# Patient Record
Sex: Female | Born: 1955 | Race: White | Hispanic: No | Marital: Married | Smoking: Never smoker
Health system: Southern US, Community
[De-identification: ages and names within clinical notes are randomized; demographics above are authoritative.]

## PROBLEM LIST (undated history)

## (undated) DIAGNOSIS — M810 Age-related osteoporosis without current pathological fracture: Secondary | ICD-10-CM

## (undated) DIAGNOSIS — Z862 Personal history of diseases of the blood and blood-forming organs and certain disorders involving the immune mechanism: Secondary | ICD-10-CM

## (undated) DIAGNOSIS — E8801 Alpha-1-antitrypsin deficiency: Secondary | ICD-10-CM

## (undated) DIAGNOSIS — K589 Irritable bowel syndrome without diarrhea: Secondary | ICD-10-CM

## (undated) DIAGNOSIS — E785 Hyperlipidemia, unspecified: Secondary | ICD-10-CM

## (undated) DIAGNOSIS — Z780 Asymptomatic menopausal state: Secondary | ICD-10-CM

## (undated) DIAGNOSIS — N951 Menopausal and female climacteric states: Secondary | ICD-10-CM

## (undated) HISTORY — DX: Personal history of diseases of the blood and blood-forming organs and certain disorders involving the immune mechanism: Z86.2

## (undated) HISTORY — DX: Asymptomatic menopausal state: Z78.0

## (undated) HISTORY — DX: Menopausal and female climacteric states: N95.1

## (undated) HISTORY — DX: Hyperlipidemia, unspecified: E78.5

## (undated) HISTORY — PX: GALLBLADDER SURGERY: SHX652

## (undated) HISTORY — PX: OTHER SURGICAL HISTORY: SHX169

## (undated) HISTORY — DX: Age-related osteoporosis without current pathological fracture: M81.0

## (undated) HISTORY — PX: CHOLECYSTECTOMY: SHX55

## (undated) HISTORY — DX: Irritable bowel syndrome, unspecified: K58.9

## (undated) HISTORY — PX: TUBAL LIGATION: SHX77

## (undated) HISTORY — DX: Alpha-1-antitrypsin deficiency: E88.01

## (undated) HISTORY — PX: DILATION AND CURETTAGE OF UTERUS: SHX78

---

## 1994-08-24 HISTORY — PX: BREAST EXCISIONAL BIOPSY: SUR124

## 1998-02-08 ENCOUNTER — Other Ambulatory Visit: Admission: RE | Admit: 1998-02-08 | Discharge: 1998-02-08 | Payer: Self-pay | Admitting: Obstetrics and Gynecology

## 1999-03-10 ENCOUNTER — Other Ambulatory Visit: Admission: RE | Admit: 1999-03-10 | Discharge: 1999-03-10 | Payer: Self-pay | Admitting: Obstetrics and Gynecology

## 1999-06-04 ENCOUNTER — Encounter: Admission: RE | Admit: 1999-06-04 | Discharge: 1999-06-04 | Payer: Self-pay | Admitting: Obstetrics and Gynecology

## 1999-06-11 ENCOUNTER — Encounter: Payer: Self-pay | Admitting: Obstetrics and Gynecology

## 1999-06-11 ENCOUNTER — Encounter: Admission: RE | Admit: 1999-06-11 | Discharge: 1999-06-11 | Payer: Self-pay | Admitting: Obstetrics and Gynecology

## 2000-04-14 ENCOUNTER — Other Ambulatory Visit: Admission: RE | Admit: 2000-04-14 | Discharge: 2000-04-14 | Payer: Self-pay | Admitting: Obstetrics and Gynecology

## 2000-08-19 ENCOUNTER — Encounter: Admission: RE | Admit: 2000-08-19 | Discharge: 2000-08-19 | Payer: Self-pay | Admitting: Obstetrics and Gynecology

## 2000-08-19 ENCOUNTER — Encounter: Payer: Self-pay | Admitting: Obstetrics and Gynecology

## 2001-08-22 ENCOUNTER — Other Ambulatory Visit: Admission: RE | Admit: 2001-08-22 | Discharge: 2001-08-22 | Payer: Self-pay | Admitting: Obstetrics and Gynecology

## 2001-09-12 ENCOUNTER — Encounter: Payer: Self-pay | Admitting: Obstetrics and Gynecology

## 2001-09-12 ENCOUNTER — Encounter: Admission: RE | Admit: 2001-09-12 | Discharge: 2001-09-12 | Payer: Self-pay | Admitting: Obstetrics and Gynecology

## 2002-09-14 ENCOUNTER — Encounter: Admission: RE | Admit: 2002-09-14 | Discharge: 2002-09-14 | Payer: Self-pay | Admitting: Obstetrics and Gynecology

## 2002-09-14 ENCOUNTER — Encounter: Payer: Self-pay | Admitting: Obstetrics and Gynecology

## 2002-12-12 ENCOUNTER — Other Ambulatory Visit: Admission: RE | Admit: 2002-12-12 | Discharge: 2002-12-12 | Payer: Self-pay | Admitting: Obstetrics and Gynecology

## 2003-08-10 ENCOUNTER — Ambulatory Visit (HOSPITAL_COMMUNITY): Admission: RE | Admit: 2003-08-10 | Discharge: 2003-08-10 | Payer: Self-pay | Admitting: Pulmonary Disease

## 2003-12-07 ENCOUNTER — Encounter: Admission: RE | Admit: 2003-12-07 | Discharge: 2003-12-07 | Payer: Self-pay | Admitting: Obstetrics and Gynecology

## 2004-01-28 ENCOUNTER — Ambulatory Visit (HOSPITAL_COMMUNITY): Admission: RE | Admit: 2004-01-28 | Discharge: 2004-01-28 | Payer: Self-pay | Admitting: Pulmonary Disease

## 2004-02-19 ENCOUNTER — Other Ambulatory Visit: Admission: RE | Admit: 2004-02-19 | Discharge: 2004-02-19 | Payer: Self-pay | Admitting: Obstetrics and Gynecology

## 2005-03-25 ENCOUNTER — Other Ambulatory Visit: Admission: RE | Admit: 2005-03-25 | Discharge: 2005-03-25 | Payer: Self-pay | Admitting: Obstetrics and Gynecology

## 2005-03-25 ENCOUNTER — Encounter: Admission: RE | Admit: 2005-03-25 | Discharge: 2005-03-25 | Payer: Self-pay | Admitting: Obstetrics and Gynecology

## 2005-04-22 ENCOUNTER — Ambulatory Visit (HOSPITAL_COMMUNITY): Admission: RE | Admit: 2005-04-22 | Discharge: 2005-04-22 | Payer: Self-pay | Admitting: Obstetrics and Gynecology

## 2005-04-22 ENCOUNTER — Encounter (INDEPENDENT_AMBULATORY_CARE_PROVIDER_SITE_OTHER): Payer: Self-pay | Admitting: Specialist

## 2005-11-03 ENCOUNTER — Ambulatory Visit (HOSPITAL_COMMUNITY): Admission: RE | Admit: 2005-11-03 | Discharge: 2005-11-03 | Payer: Self-pay | Admitting: Pulmonary Disease

## 2005-12-03 ENCOUNTER — Ambulatory Visit (HOSPITAL_COMMUNITY): Admission: RE | Admit: 2005-12-03 | Discharge: 2005-12-03 | Payer: Self-pay | Admitting: Pulmonary Disease

## 2006-06-30 ENCOUNTER — Encounter: Admission: RE | Admit: 2006-06-30 | Discharge: 2006-06-30 | Payer: Self-pay | Admitting: Obstetrics and Gynecology

## 2006-07-05 ENCOUNTER — Encounter: Admission: RE | Admit: 2006-07-05 | Discharge: 2006-07-05 | Payer: Self-pay | Admitting: Obstetrics and Gynecology

## 2006-07-05 ENCOUNTER — Encounter (INDEPENDENT_AMBULATORY_CARE_PROVIDER_SITE_OTHER): Payer: Self-pay | Admitting: *Deleted

## 2006-08-31 ENCOUNTER — Ambulatory Visit: Payer: Self-pay | Admitting: Cardiovascular Disease

## 2006-09-01 ENCOUNTER — Ambulatory Visit (HOSPITAL_COMMUNITY): Admission: RE | Admit: 2006-09-01 | Discharge: 2006-09-01 | Payer: Self-pay | Admitting: Pulmonary Disease

## 2007-01-07 ENCOUNTER — Encounter: Admission: RE | Admit: 2007-01-07 | Discharge: 2007-01-07 | Payer: Self-pay | Admitting: Obstetrics and Gynecology

## 2007-07-22 ENCOUNTER — Encounter: Admission: RE | Admit: 2007-07-22 | Discharge: 2007-07-22 | Payer: Self-pay | Admitting: Obstetrics and Gynecology

## 2007-09-06 ENCOUNTER — Ambulatory Visit (HOSPITAL_COMMUNITY): Admission: RE | Admit: 2007-09-06 | Discharge: 2007-09-06 | Payer: Self-pay | Admitting: Pulmonary Disease

## 2008-07-13 ENCOUNTER — Ambulatory Visit (HOSPITAL_COMMUNITY): Admission: RE | Admit: 2008-07-13 | Discharge: 2008-07-13 | Payer: Self-pay | Admitting: Pulmonary Disease

## 2008-09-10 ENCOUNTER — Encounter: Admission: RE | Admit: 2008-09-10 | Discharge: 2008-09-10 | Payer: Self-pay | Admitting: Obstetrics and Gynecology

## 2008-10-18 ENCOUNTER — Ambulatory Visit (HOSPITAL_COMMUNITY): Admission: RE | Admit: 2008-10-18 | Discharge: 2008-10-18 | Payer: Self-pay | Admitting: Pulmonary Disease

## 2009-06-04 ENCOUNTER — Ambulatory Visit (HOSPITAL_COMMUNITY): Admission: RE | Admit: 2009-06-04 | Discharge: 2009-06-04 | Payer: Self-pay | Admitting: Pulmonary Disease

## 2009-09-20 ENCOUNTER — Ambulatory Visit: Admission: RE | Admit: 2009-09-20 | Discharge: 2009-09-20 | Payer: Self-pay | Admitting: Pulmonary Disease

## 2009-10-21 LAB — PULMONARY FUNCTION TEST

## 2009-10-25 ENCOUNTER — Encounter: Admission: RE | Admit: 2009-10-25 | Discharge: 2009-10-25 | Payer: Self-pay | Admitting: Obstetrics and Gynecology

## 2009-12-02 LAB — CBC AND DIFFERENTIAL
HCT: 41 (ref 36–46)
Hemoglobin: 14.4 (ref 12.0–16.0)
Platelets: 235 (ref 150–399)
WBC: 4.7

## 2009-12-02 LAB — BASIC METABOLIC PANEL
BUN: 16 (ref 4–21)
Creatinine: 0.7 (ref <=1.1)
Glucose: 93
Potassium: 4 (ref 3.4–5.3)

## 2009-12-02 LAB — HEPATIC FUNCTION PANEL
ALT: 50 — AB (ref 7–35)
AST: 40 — AB (ref 13–35)

## 2009-12-02 LAB — POCT ERYTHROCYTE SEDIMENTATION RATE, NON-AUTOMATED: Sed Rate: 9

## 2010-09-14 ENCOUNTER — Encounter: Payer: Self-pay | Admitting: Obstetrics and Gynecology

## 2010-10-20 ENCOUNTER — Other Ambulatory Visit (HOSPITAL_COMMUNITY): Payer: Self-pay | Admitting: Pulmonary Disease

## 2010-10-24 ENCOUNTER — Ambulatory Visit (HOSPITAL_COMMUNITY)
Admission: RE | Admit: 2010-10-24 | Discharge: 2010-10-24 | Payer: BC Managed Care – PPO | Source: Ambulatory Visit | Attending: Pulmonary Disease | Admitting: Pulmonary Disease

## 2010-10-30 ENCOUNTER — Other Ambulatory Visit (HOSPITAL_COMMUNITY): Payer: Self-pay | Admitting: Pulmonary Disease

## 2010-10-30 DIAGNOSIS — R109 Unspecified abdominal pain: Secondary | ICD-10-CM

## 2010-11-03 ENCOUNTER — Ambulatory Visit (HOSPITAL_COMMUNITY): Payer: BC Managed Care – PPO

## 2010-11-06 ENCOUNTER — Ambulatory Visit (HOSPITAL_COMMUNITY)
Admission: RE | Admit: 2010-11-06 | Discharge: 2010-11-06 | Disposition: A | Discharge status: Home / Self Care | Payer: BC Managed Care – PPO | Source: Ambulatory Visit | Attending: Pulmonary Disease | Admitting: Pulmonary Disease

## 2010-11-06 DIAGNOSIS — R1903 Right lower quadrant abdominal swelling, mass and lump: Secondary | ICD-10-CM | POA: Insufficient documentation

## 2010-11-06 DIAGNOSIS — R109 Unspecified abdominal pain: Secondary | ICD-10-CM

## 2010-11-24 ENCOUNTER — Other Ambulatory Visit: Payer: Self-pay | Admitting: Obstetrics and Gynecology

## 2010-11-24 DIAGNOSIS — Z1231 Encounter for screening mammogram for malignant neoplasm of breast: Secondary | ICD-10-CM

## 2010-12-23 ENCOUNTER — Ambulatory Visit: Payer: BC Managed Care – PPO

## 2010-12-25 ENCOUNTER — Ambulatory Visit
Admission: RE | Admit: 2010-12-25 | Discharge: 2010-12-25 | Disposition: A | Discharge status: Home / Self Care | Payer: BC Managed Care – PPO | Source: Ambulatory Visit | Attending: Obstetrics and Gynecology | Admitting: Obstetrics and Gynecology

## 2010-12-25 DIAGNOSIS — Z1231 Encounter for screening mammogram for malignant neoplasm of breast: Secondary | ICD-10-CM

## 2011-01-09 NOTE — H&P (Signed)
NAME:  Kathleen Hammond, ARCHULETA NO.:  [id number]   MEDICAL RECORD NO.:  [id number]          PATIENT TYPE:  AMB   LOCATION:                                FACILITY:  [facility]   PHYSICIAN:  Juluis Mire, M.D.   DATE OF BIRTH:  1956/04/15   DATE OF ADMISSION:  04/22/2005  DATE OF DISCHARGE:                                HISTORY & PHYSICAL   Patient is a 55 year old gravida 4, para 3, abortus 1 married female.  Presents for hysteroscopy.   In relation to the present admission, patient has been noticing abnormal  uterine bleeding.  She underwent a saline infusion ultrasound with finding  of a posterior wall endometrial polyp.  In view of this the patient now  presents for hysteroscopic evaluation and resection of the posterior wall  polyp.   ALLERGIES:  IODINE, MACDRODANTIN, CECLOR.   MEDICATIONS:  None.   PAST MEDICAL HISTORY:  She has usual childhood diseases without any  significant sequelae.   PAST SURGICAL HISTORY:  She had a cholecystectomy, previous lump removed  from the breast, a cyst removed from the gum and she has had three cesarean  sections, last one with bilateral tubal ligation.   SOCIAL HISTORY:  No tobacco or alcohol use.   FAMILY HISTORY:  Father with a history of hypertension and heart disease.  Mother with a history of endocrinologic disorders.   REVIEW OF SYSTEMS:  Noncontributory.   PHYSICAL EXAMINATION:  VITAL SIGNS:  Patient is afebrile with stable vital  signs.  HEENT:  Patient normocephalic.  Pupils are equal, round, and reactive to  light and accommodation.  Extraocular movements are intact.  Sclerae and  conjunctivae clear.  Oropharynx clear.  NECK:  Without thyromegaly.  BREASTS:  No discrete masses.  LUNGS:  Clear.  CARDIOVASCULAR:  Regular rate and rhythm.  No murmurs or gallops.  ABDOMEN:  Benign.  No masses, organomegaly, or tenderness.  PELVIC:  Normal external genitalia.  Vaginal mucosa is clear.  Cervix  unremarkable.  Uterus  normal size, shape, and contour.  Adnexa free of  masses or tenderness.  EXTREMITIES:  Trace edema.  NEUROLOGIC:  Grossly within normal limits.   IMPRESSION:  Endometrial polyp.   PLAN:  The patient will undergo hysteroscopic evaluation and resection of  polyp.  The risks of surgery have been discussed including the risk of  infection, risk of hemorrhage that could require transfusion with the risks  of AIDS or hepatitis, the risk of injury to adjacent organs through  perforation that could require further exploratory surgery, risk of deep  venous thrombosis and pulmonary embolus.  Patient expressed understanding of  indications and risks.           ______________________________  Juluis Mire, M.D.     JSM/MEDQ  D:  04/22/2005  T:  04/22/2005  Job:  [id number]

## 2011-01-09 NOTE — Procedures (Signed)
NAME:  Kathleen Hammond, Kathleen Hammond                 ACCOUNT NO.:  [id number]   MEDICAL RECORD NO.:  [id number]          PATIENT TYPE:  OUT   LOCATION:  RAD                           FACILITY:  [facility]   PHYSICIAN:  Peter C. Eden Emms, MD, FACCDATE OF BIRTH:  [birth year]   DATE OF PROCEDURE:  DATE OF DISCHARGE:                                ECHOCARDIOGRAM   A 50-year female with PVCs.   Left ventricular cavity size was normal.  There were no regional wall  motion abnormalities.  Ejection fraction was 60%.  Mitral, aortic,  tricuspid valves were normal.  There was no evidence of pulmonary  hypertension.  Left atrium and right-sided cardiac chambers were normal.  Subcostal imaging revealed no atrial septal defect, no source of  embolus, no effusion.  M-mode measurements included an aortic diameter  of 2.6-cm, left atrial diameter of 3.5-cm, septal thickness 1.2-cm,  posterior wall thickness 1-cm, and systolic diameter 2.6-cm, and LV  diastolic diameter 3.55-cm.   IMPRESSION:  Normal 2-D echocardiogram, ejection fraction 60%, second.      Noralyn Pick. Eden Emms, MD, [facility]  Electronically Signed     PCN/MEDQ  D:  09/01/2006  T:  09/01/2006  Job:  [id number]   cc:   Ramon Dredge L. Juanetta Gosling, M.D.  Fax: [phone]

## 2011-01-09 NOTE — Op Note (Signed)
NAME:  Kathleen Hammond, YAGI                 ACCOUNT NO.:  [id number]   MEDICAL RECORD NO.:  [id number]          PATIENT TYPE:  AMB   LOCATION:  SD[facility]                           FACILITY:  [facility]   PHYSICIAN:  Juluis Mire, M.D.   DATE OF BIRTH:  06-12-1956   DATE OF PROCEDURE:  04/22/2005  DATE OF DISCHARGE:                                 OPERATIVE REPORT   PREOPERATIVE DIAGNOSIS:  Abnormal uterine bleeding with endometrial polyp.   POSTOPERATIVE DIAGNOSIS:  Abnormal uterine bleeding with endometrial polyp.   OPERATIVE PROCEDURE:  Hysteroscopy with resection of endometrial polyp and  endometrial curettings.   ANESTHESIA:  Paracervical block and sedation.   ESTIMATED BLOOD LOSS:  Minimal.   PACKS AND DRAINS:  None.   INTRAOPERATIVE BLOOD REPLACED:  None.   COMPLICATIONS:  None.   INDICATIONS:  Dictated in the history and physical.   PROCEDURE:  The patient was taken to the OR and placed in the supine  position.  After slight sedation, the patient was placed in the dorsal  lithotomy position using the Allen stirrups.  The patient then draped in a  sterile field.  A speculum was placed in the vaginal vault.  The cervix was  cleansed with antiseptic solution.  A paracervical block was administered  using 1% Nesacaine.  The cervix was grasped with a single-tooth tenaculum.  The uterus sounded initially fairly shallow, felt like we were not getting  it fully back.  We therefore dilated the endocervical canal and inserted the  hysteroscope and could identify the canal going posteriorly.  We re-sounded  to approximately 8 cm.  The remaining endocervical canal was dilated to a  size 33 Pratt dilator.  The operative hysteroscope was introduced into the  intrauterine cavity.  It really did not reveal any significant polyp.  It  may have been the distortion caused by the tortuous endocervical canal.  We  did multiple endometrial biopsies along with curetting.  These were all sent  for pathologic  review.  There was no sign of perforation.  Total deficit was  130 mL.  The hysteroscope was then removed.  The single-tooth tenaculum and  speculum then were taken out of the vaginal vault.  The patient taken out of  the dorsal lithotomy position and once alert transferred to the recovery  room in good condition.  The sponge, instrument and needle count were  reported as correct by the circulating nurse.      Juluis Mire, M.D.  Electronically Signed    JSM/MEDQ  D:  04/22/2005  T:  04/22/2005  Job:  [id number]

## 2011-03-13 ENCOUNTER — Encounter: Payer: Self-pay | Admitting: *Deleted

## 2011-03-13 ENCOUNTER — Encounter: Payer: Self-pay | Admitting: Cardiology

## 2011-03-16 ENCOUNTER — Encounter: Payer: Self-pay | Admitting: Cardiology

## 2011-03-16 ENCOUNTER — Ambulatory Visit (INDEPENDENT_AMBULATORY_CARE_PROVIDER_SITE_OTHER): Payer: BC Managed Care – PPO | Admitting: Cardiology

## 2011-03-16 VITALS — BP 119/72 | HR 137 | Ht 66.0 in | Wt 176.0 lb

## 2011-03-16 DIAGNOSIS — R000 Tachycardia, unspecified: Secondary | ICD-10-CM | POA: Insufficient documentation

## 2011-03-16 DIAGNOSIS — I498 Other specified cardiac arrhythmias: Secondary | ICD-10-CM

## 2011-03-16 DIAGNOSIS — E782 Mixed hyperlipidemia: Secondary | ICD-10-CM | POA: Insufficient documentation

## 2011-03-16 NOTE — Progress Notes (Signed)
HPI Kathleen Hammond is a delightful 55 yo MWF referred by Dr Arelia Sneddon for mixed hyperlipidemia.  Her last  Lipid profile showed a total cholesterol of 249, triglycerides 175, HDL 62, LDL 152 with a total cholesterol to HDL ratio 4.0. Her LFTs were normal. CRP was average at 2.5.  Of note, she has gained about 30 pounds off and on prednisone for a recent diagnosis of asthma and alpha one antitrypsin deficiency.  Her father had a heart attack at age 18 but was a smoker.  The patient has no history of diabetes, has never smoked, and does not have hypertension. She does not exercise on a regular basis. Her EKG today is remarkable for sinus tachycardia. The initial EKG showed a rate of 137 and the second one showed a rate of 122 beats per minute. No ST segment changes.  She has  dyspnea on exertion particularly on hot and humid days like today. She has not had any angina. Past Medical History  Diagnosis Date  . Asthma   . Postmenopausal   . Perimenopausal vasomotor symptoms   . Urinary incontinence   . Alpha-1-antitrypsin deficiency   . History of anemia     Past Surgical History  Procedure Date  . Cholecystectomy   . Lump removed from the breast   . Tubal ligation   . Cyst removed from the gum   . Cesarean section   . Dilation and curettage of uterus   . Gallbladder surgery     Family History  Problem Relation Age of Onset  . Hypertension    . Heart disease    . Stroke    . Heart attack Father 7    massive heart attack  . Hypertension Father   . Cancer Mother     sinus   . Anemia Mother     History   Social History  . Marital Status: Married    Spouse Name: N/A    Number of Children: N/A  . Years of Education: N/A   Occupational History  . Not on file.   Social History Main Topics  . Smoking status: Never Smoker   . Smokeless tobacco: Never Used  . Alcohol Use: Yes     occasional  . Drug Use: No  . Sexually Active: Not on file   Other Topics Concern  . Not on  file   Social History Narrative  . No narrative on file    Allergies  Allergen Reactions  . Bactrim   . Ceftin   . Cetrimide   . Iodine   . Shrimp (Shellfish Allergy)     Can eat lobster    Current Outpatient Prescriptions  Medication Sig Dispense Refill  . albuterol (PROVENTIL HFA;VENTOLIN HFA) 108 (90 BASE) MCG/ACT inhaler Inhale 2 puffs into the lungs as needed.        Marland Kitchen albuterol (PROVENTIL) (2.5 MG/3ML) 0.083% nebulizer solution Take 2.5 mg by nebulization as needed.        . Budesonide-Formoterol Fumarate (SYMBICORT IN) Inhale 2 puffs into the lungs daily.        . ergocalciferol (VITAMIN D2) 50000 UNITS capsule Take 50,000 Units by mouth once a week.        Marland Kitchen ofloxacin (OCUFLOX) 0.3 % ophthalmic solution AS DIRECTED      . PARoxetine (PAXIL) 10 MG tablet Take 1 tablet by mouth Daily.      Marland Kitchen SINGULAIR 10 MG tablet Take 1 tablet by mouth Daily.  ROS Negative other than HPI.   PE General Appearance: well developed, well nourished in no acute distress, overweight HEENT: symmetrical face, PERRLA, good dentition  Neck: no JVD, thyromegaly, or adenopathy, trachea midline Chest: symmetric without deformity Cardiac: PMI non-displaced, RRR, normal S1, S2, no gallop or murmur Lung: clear to ausculation and percussion Vascular: all pulses full without bruits  Abdominal: nondistended, nontender, good bowel sounds, no HSM, no bruits Extremities: no cyanosis, clubbing or edema, no sign of DVT, no varicosities  Skin: normal color, no rashes Neuro: alert and oriented x 3, non-focal Pysch: normal affect Filed Vitals:   03/16/11 1443  BP: 119/72  Pulse: 137  Height: 5\' 6"  (1.676 m)  Weight: 176 lb (79.833 kg)    EKG  Labs and Studies Reviewed.   No results found for this basename: WBC, HGB, HCT, MCV, PLT      Chemistry   No results found for this basename: NA, K, CL, CO2, BUN, CREATININE, GLU   No results found for this basename: CALCIUM, ALKPHOS, AST, ALT,  BILITOT       No results found for this basename: CHOL   No results found for this basename: HDL   No results found for this basename: LDLCALC   No results found for this basename: TRIG   No results found for this basename: CHOLHDL   No results found for this basename: HGBA1C   No results found for this basename: ALT, AST, GGT, ALKPHOS, BILITOT   No results found for this basename: TSH

## 2011-03-16 NOTE — Patient Instructions (Signed)
Your physician discussed the importance of regular exercise and recommended that you start or continue a regular exercise program for good health.  3 hours per week at the gym  Your physician encouraged you to lose weight for better health.  A weight loss of 1 pound per week to reach your goal weight loss of 30 pounds.  A goal heart rate is less than or equal to 90 beats per minute.  Your physician recommends that you schedule a follow-up appointment in: as needed with Dr. Daleen Squibb

## 2011-04-07 ENCOUNTER — Encounter: Payer: Self-pay | Admitting: Cardiology

## 2011-12-02 ENCOUNTER — Other Ambulatory Visit: Payer: Self-pay | Admitting: Obstetrics and Gynecology

## 2011-12-02 DIAGNOSIS — N644 Mastodynia: Secondary | ICD-10-CM

## 2011-12-04 ENCOUNTER — Ambulatory Visit
Admission: RE | Admit: 2011-12-04 | Discharge: 2011-12-04 | Disposition: A | Discharge status: Home / Self Care | Payer: BC Managed Care – PPO | Source: Ambulatory Visit | Attending: Obstetrics and Gynecology | Admitting: Obstetrics and Gynecology

## 2011-12-04 ENCOUNTER — Other Ambulatory Visit: Payer: Self-pay | Admitting: Obstetrics and Gynecology

## 2011-12-04 DIAGNOSIS — N644 Mastodynia: Secondary | ICD-10-CM

## 2011-12-04 DIAGNOSIS — Z1231 Encounter for screening mammogram for malignant neoplasm of breast: Secondary | ICD-10-CM

## 2011-12-29 ENCOUNTER — Ambulatory Visit
Admission: RE | Admit: 2011-12-29 | Discharge: 2011-12-29 | Disposition: A | Discharge status: Home / Self Care | Payer: BC Managed Care – PPO | Source: Ambulatory Visit | Attending: Obstetrics and Gynecology | Admitting: Obstetrics and Gynecology

## 2011-12-29 DIAGNOSIS — Z1231 Encounter for screening mammogram for malignant neoplasm of breast: Secondary | ICD-10-CM

## 2012-04-05 ENCOUNTER — Other Ambulatory Visit (HOSPITAL_COMMUNITY): Payer: Self-pay | Admitting: Pulmonary Disease

## 2012-04-05 DIAGNOSIS — J45909 Unspecified asthma, uncomplicated: Secondary | ICD-10-CM

## 2012-04-06 ENCOUNTER — Other Ambulatory Visit (HOSPITAL_COMMUNITY): Payer: Self-pay | Admitting: Pulmonary Disease

## 2012-04-06 ENCOUNTER — Ambulatory Visit (HOSPITAL_COMMUNITY): Payer: BC Managed Care – PPO

## 2012-04-06 ENCOUNTER — Ambulatory Visit (HOSPITAL_COMMUNITY)
Admission: RE | Admit: 2012-04-06 | Discharge: 2012-04-06 | Disposition: A | Discharge status: Home / Self Care | Payer: BC Managed Care – PPO | Source: Ambulatory Visit | Attending: Pulmonary Disease | Admitting: Pulmonary Disease

## 2012-04-06 DIAGNOSIS — J45909 Unspecified asthma, uncomplicated: Secondary | ICD-10-CM

## 2012-04-06 DIAGNOSIS — E8801 Alpha-1-antitrypsin deficiency: Secondary | ICD-10-CM | POA: Insufficient documentation

## 2012-04-06 LAB — LIPID PANEL
Cholesterol: 304 — AB (ref 0–200)
HDL: 65 (ref 35–70)
LDL Cholesterol: 209
Triglycerides: 148 (ref 40–160)

## 2012-04-06 LAB — HEPATIC FUNCTION PANEL
ALT: 16 (ref 7–35)
AST: 15 (ref 13–35)
Alkaline Phosphatase: 78 (ref 25–125)

## 2012-04-06 LAB — TSH: TSH: 0.62 (ref <=5.90)

## 2012-04-06 LAB — CBC AND DIFFERENTIAL
HCT: 43 (ref 36–46)
Hemoglobin: 14.4 (ref 12.0–16.0)
Platelets: 235 (ref 150–399)
WBC: 7.8

## 2012-04-06 LAB — IRON,TIBC AND FERRITIN PANEL: Ferritin: 128

## 2012-04-07 LAB — VITAMIN D 25 HYDROXY (VIT D DEFICIENCY, FRACTURES): Vit D, 25-Hydroxy: 32

## 2012-04-15 ENCOUNTER — Ambulatory Visit (HOSPITAL_COMMUNITY)
Admission: RE | Admit: 2012-04-15 | Discharge: 2012-04-15 | Disposition: A | Discharge status: Home / Self Care | Payer: BC Managed Care – PPO | Source: Ambulatory Visit | Attending: Pulmonary Disease | Admitting: Pulmonary Disease

## 2012-04-15 DIAGNOSIS — R0989 Other specified symptoms and signs involving the circulatory and respiratory systems: Secondary | ICD-10-CM | POA: Insufficient documentation

## 2012-04-15 DIAGNOSIS — R0609 Other forms of dyspnea: Secondary | ICD-10-CM | POA: Insufficient documentation

## 2012-04-15 DIAGNOSIS — J45909 Unspecified asthma, uncomplicated: Secondary | ICD-10-CM | POA: Insufficient documentation

## 2012-04-15 MED ORDER — ALBUTEROL SULFATE (5 MG/ML) 0.5% IN NEBU
2.5000 mg | INHALATION_SOLUTION | Freq: Once | RESPIRATORY_TRACT | Status: AC
  Administered 2012-04-15: 2.5 mg via RESPIRATORY_TRACT

## 2012-04-19 NOTE — Procedures (Signed)
NAMEBRAILYNN, Kathleen Hammond NO.:  [id number]  MEDICAL RECORD NO.:  [id number]  LOCATION:                                 FACILITY:  PHYSICIAN:  Bao Bazen L. Juanetta Gosling, M.D.DATE OF BIRTH:  1956-07-17  DATE OF PROCEDURE:  04/18/2012 DATE OF DISCHARGE:                           PULMONARY FUNCTION TEST   Reason for pulmonary function testing is asthma.  1. Spirometry shows no ventilatory defect and small amount of airflow     obstruction. 2. Lung volumes are normal. 3. DLCO is moderately reduced, but corrects when ventilation is taken     into account. 4. Airway resistance is increased confirming the presence of airflow     obstruction. 5. There is improvement with inhaled bronchodilator that reaches the     level of significance. 6. This study is consistent with asthma.     Gary Bultman L. Juanetta Gosling, M.D.     ELH/MEDQ  D:  04/18/2012  T:  04/18/2012  Job:  [id number]

## 2012-04-20 LAB — PULMONARY FUNCTION TEST

## 2012-06-09 ENCOUNTER — Encounter (HOSPITAL_COMMUNITY): Payer: Self-pay

## 2012-06-09 ENCOUNTER — Encounter (HOSPITAL_COMMUNITY)
Admission: RE | Admit: 2012-06-09 | Discharge: 2012-06-09 | Disposition: A | Discharge status: Home / Self Care | Payer: BC Managed Care – PPO | Source: Ambulatory Visit | Attending: Pulmonary Disease | Admitting: Pulmonary Disease

## 2012-06-09 VITALS — BP 100/62 | HR 81 | Ht 66.0 in | Wt 174.2 lb

## 2012-06-09 DIAGNOSIS — J441 Chronic obstructive pulmonary disease with (acute) exacerbation: Secondary | ICD-10-CM

## 2012-06-09 DIAGNOSIS — J45909 Unspecified asthma, uncomplicated: Secondary | ICD-10-CM | POA: Insufficient documentation

## 2012-06-09 DIAGNOSIS — J449 Chronic obstructive pulmonary disease, unspecified: Secondary | ICD-10-CM

## 2012-06-09 NOTE — Progress Notes (Signed)
Patient was referred to Pulmonary Rehab by Dr. Kari Baars due to COPD/Asthma. During orientation advised patient on arrival and appointment times what to wear, what to do before, during and after exercise. Reviewed attendance and class policy. Talked about inclement weather and class consultation policy. Pt is scheduled to start Pulmonary Rehab on 06/13/12 at 1 pm. Pt was advised to come to class 5 minutes before class starts. He was also given instructions on meeting with the dietician and attending the Family Structure classes. Pt is eager to get started.

## 2012-06-09 NOTE — Patient Instructions (Signed)
Pt has finished orientation and is scheduled to start PR on 06/13/12 at 1 pm. Pt has been instructed to arrive to class 15 minutes early for scheduled class. Pt has been instructed to wear comfortable clothing and shoes with rubber soles. Pt has been told to take their medications 1 hour prior to coming to class.  If the patient is not going to attend class, he/she has been instructed to call.

## 2012-06-13 ENCOUNTER — Encounter (HOSPITAL_COMMUNITY): Payer: BC Managed Care – PPO

## 2012-06-15 ENCOUNTER — Encounter (HOSPITAL_COMMUNITY)
Admission: RE | Admit: 2012-06-15 | Discharge: 2012-06-15 | Disposition: A | Discharge status: Home / Self Care | Payer: BC Managed Care – PPO | Source: Ambulatory Visit | Attending: Pulmonary Disease | Admitting: Pulmonary Disease

## 2012-06-20 ENCOUNTER — Encounter (HOSPITAL_COMMUNITY)
Admission: RE | Admit: 2012-06-20 | Discharge: 2012-06-20 | Disposition: A | Discharge status: Home / Self Care | Payer: BC Managed Care – PPO | Source: Ambulatory Visit | Attending: Pulmonary Disease | Admitting: Pulmonary Disease

## 2012-06-22 ENCOUNTER — Encounter (HOSPITAL_COMMUNITY): Payer: BC Managed Care – PPO

## 2012-06-27 ENCOUNTER — Encounter (HOSPITAL_COMMUNITY): Payer: BC Managed Care – PPO

## 2012-06-29 ENCOUNTER — Encounter (HOSPITAL_COMMUNITY): Payer: BC Managed Care – PPO

## 2012-07-04 ENCOUNTER — Encounter (HOSPITAL_COMMUNITY)
Admission: RE | Admit: 2012-07-04 | Discharge: 2012-07-04 | Disposition: A | Discharge status: Home / Self Care | Payer: BC Managed Care – PPO | Source: Ambulatory Visit | Attending: Pulmonary Disease | Admitting: Pulmonary Disease

## 2012-07-04 DIAGNOSIS — J45909 Unspecified asthma, uncomplicated: Secondary | ICD-10-CM | POA: Insufficient documentation

## 2012-07-06 ENCOUNTER — Encounter (HOSPITAL_COMMUNITY)
Admission: RE | Admit: 2012-07-06 | Discharge: 2012-07-06 | Disposition: A | Discharge status: Home / Self Care | Payer: BC Managed Care – PPO | Source: Ambulatory Visit | Attending: Pulmonary Disease | Admitting: Pulmonary Disease

## 2012-07-11 ENCOUNTER — Encounter (HOSPITAL_COMMUNITY)
Admission: RE | Admit: 2012-07-11 | Discharge: 2012-07-11 | Disposition: A | Discharge status: Home / Self Care | Payer: BC Managed Care – PPO | Source: Ambulatory Visit | Attending: Pulmonary Disease | Admitting: Pulmonary Disease

## 2012-07-13 ENCOUNTER — Encounter (HOSPITAL_COMMUNITY): Payer: BC Managed Care – PPO

## 2012-07-15 LAB — LIPID PANEL
Cholesterol: 192 (ref 0–200)
HDL: 60 (ref 35–70)
LDL Cholesterol: 96
Triglycerides: 178 — AB (ref 40–160)

## 2012-07-15 LAB — HEPATIC FUNCTION PANEL
ALT: 35 (ref 7–35)
AST: 31 (ref 13–35)
Alkaline Phosphatase: 80 (ref 25–125)

## 2012-07-18 ENCOUNTER — Encounter (HOSPITAL_COMMUNITY): Payer: BC Managed Care – PPO

## 2012-07-20 ENCOUNTER — Encounter (HOSPITAL_COMMUNITY): Payer: BC Managed Care – PPO

## 2012-07-25 ENCOUNTER — Encounter (HOSPITAL_COMMUNITY): Payer: BC Managed Care – PPO

## 2012-07-27 ENCOUNTER — Encounter (HOSPITAL_COMMUNITY): Payer: BC Managed Care – PPO

## 2012-08-01 ENCOUNTER — Encounter (HOSPITAL_COMMUNITY): Payer: BC Managed Care – PPO

## 2012-08-03 ENCOUNTER — Encounter (HOSPITAL_COMMUNITY)
Admission: RE | Admit: 2012-08-03 | Discharge: 2012-08-03 | Disposition: A | Discharge status: Home / Self Care | Payer: BC Managed Care – PPO | Source: Ambulatory Visit | Attending: Pulmonary Disease | Admitting: Pulmonary Disease

## 2012-08-03 DIAGNOSIS — J45909 Unspecified asthma, uncomplicated: Secondary | ICD-10-CM | POA: Insufficient documentation

## 2012-08-08 ENCOUNTER — Encounter (HOSPITAL_COMMUNITY): Payer: BC Managed Care – PPO

## 2012-08-10 ENCOUNTER — Encounter (HOSPITAL_COMMUNITY): Payer: BC Managed Care – PPO

## 2012-08-15 ENCOUNTER — Encounter (HOSPITAL_COMMUNITY): Payer: BC Managed Care – PPO

## 2012-08-22 ENCOUNTER — Encounter (HOSPITAL_COMMUNITY)
Admission: RE | Admit: 2012-08-22 | Discharge: 2012-08-22 | Disposition: A | Discharge status: Home / Self Care | Payer: BC Managed Care – PPO | Source: Ambulatory Visit | Attending: Pulmonary Disease | Admitting: Pulmonary Disease

## 2012-08-24 ENCOUNTER — Encounter (HOSPITAL_COMMUNITY): Payer: BC Managed Care – PPO

## 2012-08-29 ENCOUNTER — Encounter (HOSPITAL_COMMUNITY): Payer: BC Managed Care – PPO

## 2012-08-31 ENCOUNTER — Encounter (HOSPITAL_COMMUNITY): Payer: BC Managed Care – PPO

## 2012-08-31 ENCOUNTER — Encounter (HOSPITAL_COMMUNITY)
Admission: RE | Admit: 2012-08-31 | Discharge: 2012-08-31 | Disposition: A | Discharge status: Home / Self Care | Payer: BC Managed Care – PPO | Source: Ambulatory Visit | Attending: Pulmonary Disease | Admitting: Pulmonary Disease

## 2012-08-31 DIAGNOSIS — J45909 Unspecified asthma, uncomplicated: Secondary | ICD-10-CM | POA: Insufficient documentation

## 2012-09-05 ENCOUNTER — Encounter (HOSPITAL_COMMUNITY): Payer: BC Managed Care – PPO

## 2012-09-07 ENCOUNTER — Encounter (HOSPITAL_COMMUNITY): Payer: BC Managed Care – PPO

## 2012-09-12 ENCOUNTER — Encounter (HOSPITAL_COMMUNITY): Payer: BC Managed Care – PPO

## 2012-09-14 ENCOUNTER — Encounter (HOSPITAL_COMMUNITY): Payer: BC Managed Care – PPO

## 2012-09-19 ENCOUNTER — Encounter (HOSPITAL_COMMUNITY): Payer: BC Managed Care – PPO

## 2012-09-21 ENCOUNTER — Encounter (HOSPITAL_COMMUNITY): Payer: BC Managed Care – PPO

## 2012-09-26 ENCOUNTER — Encounter (HOSPITAL_COMMUNITY): Payer: BC Managed Care – PPO

## 2012-09-28 ENCOUNTER — Encounter (HOSPITAL_COMMUNITY): Payer: BC Managed Care – PPO

## 2012-10-03 ENCOUNTER — Encounter (HOSPITAL_COMMUNITY): Payer: BC Managed Care – PPO

## 2012-10-05 ENCOUNTER — Encounter (HOSPITAL_COMMUNITY): Payer: BC Managed Care – PPO

## 2012-10-10 ENCOUNTER — Encounter (HOSPITAL_COMMUNITY): Payer: BC Managed Care – PPO

## 2012-10-12 ENCOUNTER — Encounter (HOSPITAL_COMMUNITY): Payer: BC Managed Care – PPO

## 2012-10-17 ENCOUNTER — Encounter (HOSPITAL_COMMUNITY): Payer: BC Managed Care – PPO

## 2012-10-19 ENCOUNTER — Encounter (HOSPITAL_COMMUNITY): Payer: BC Managed Care – PPO

## 2012-10-24 ENCOUNTER — Encounter (HOSPITAL_COMMUNITY): Payer: BC Managed Care – PPO

## 2012-10-26 ENCOUNTER — Encounter (HOSPITAL_COMMUNITY): Payer: BC Managed Care – PPO

## 2012-10-31 ENCOUNTER — Encounter (HOSPITAL_COMMUNITY): Payer: BC Managed Care – PPO

## 2012-11-02 ENCOUNTER — Encounter (HOSPITAL_COMMUNITY): Payer: BC Managed Care – PPO

## 2012-11-05 ENCOUNTER — Emergency Department (HOSPITAL_COMMUNITY)
Admission: EM | Admit: 2012-11-05 | Discharge: 2012-11-05 | Disposition: A | Discharge status: Home / Self Care | Payer: BC Managed Care – PPO | Attending: Emergency Medicine | Admitting: Emergency Medicine

## 2012-11-05 ENCOUNTER — Encounter (HOSPITAL_COMMUNITY): Payer: Self-pay

## 2012-11-05 ENCOUNTER — Emergency Department (HOSPITAL_COMMUNITY): Payer: BC Managed Care – PPO

## 2012-11-05 DIAGNOSIS — Z862 Personal history of diseases of the blood and blood-forming organs and certain disorders involving the immune mechanism: Secondary | ICD-10-CM | POA: Insufficient documentation

## 2012-11-05 DIAGNOSIS — R110 Nausea: Secondary | ICD-10-CM | POA: Insufficient documentation

## 2012-11-05 DIAGNOSIS — K859 Acute pancreatitis without necrosis or infection, unspecified: Secondary | ICD-10-CM | POA: Insufficient documentation

## 2012-11-05 DIAGNOSIS — Z79899 Other long term (current) drug therapy: Secondary | ICD-10-CM | POA: Insufficient documentation

## 2012-11-05 DIAGNOSIS — J45909 Unspecified asthma, uncomplicated: Secondary | ICD-10-CM | POA: Insufficient documentation

## 2012-11-05 DIAGNOSIS — R197 Diarrhea, unspecified: Secondary | ICD-10-CM | POA: Insufficient documentation

## 2012-11-05 DIAGNOSIS — Z8639 Personal history of other endocrine, nutritional and metabolic disease: Secondary | ICD-10-CM | POA: Insufficient documentation

## 2012-11-05 DIAGNOSIS — R630 Anorexia: Secondary | ICD-10-CM | POA: Insufficient documentation

## 2012-11-05 DIAGNOSIS — Z780 Asymptomatic menopausal state: Secondary | ICD-10-CM | POA: Insufficient documentation

## 2012-11-05 LAB — URINALYSIS, ROUTINE W REFLEX MICROSCOPIC
Bilirubin Urine: NEGATIVE
Hgb urine dipstick: NEGATIVE
Specific Gravity, Urine: 1.02 (ref 1.005–1.030)
pH: 6 (ref 5.0–8.0)

## 2012-11-05 LAB — COMPREHENSIVE METABOLIC PANEL
ALT: 17 U/L (ref 0–35)
AST: 19 U/L (ref 0–37)
Albumin: 3.7 g/dL (ref 3.5–5.2)
Alkaline Phosphatase: 75 U/L (ref 39–117)
Calcium: 8.8 mg/dL (ref 8.4–10.5)
Potassium: 3.2 mEq/L — ABNORMAL LOW (ref 3.5–5.1)
Sodium: 142 mEq/L (ref 135–145)
Total Protein: 6.5 g/dL (ref 6.0–8.3)

## 2012-11-05 LAB — CBC WITH DIFFERENTIAL/PLATELET
Basophils Absolute: 0 10*3/uL (ref 0.0–0.1)
Basophils Relative: 1 % (ref 0–1)
Eosinophils Absolute: 0.1 10*3/uL (ref 0.0–0.7)
Lymphs Abs: 0.9 10*3/uL (ref 0.7–4.0)
MCH: 30.5 pg (ref 26.0–34.0)
MCHC: 35.4 g/dL (ref 30.0–36.0)
Neutrophils Relative %: 56 % (ref 43–77)
Platelets: 164 10*3/uL (ref 150–400)
RBC: 4.42 MIL/uL (ref 3.87–5.11)
RDW: 12.7 % (ref 11.5–15.5)

## 2012-11-05 MED ORDER — HYDROCODONE-ACETAMINOPHEN 5-325 MG PO TABS: 1.0000 | ORAL_TABLET | Freq: Three times a day (TID) | ORAL | Status: DC | PRN

## 2012-11-05 MED ORDER — IOHEXOL 300 MG/ML  SOLN
100.0000 mL | Freq: Once | INTRAMUSCULAR | Status: AC | PRN
  Administered 2012-11-05: 100 mL via INTRAVENOUS

## 2012-11-05 MED ORDER — SODIUM CHLORIDE 0.9 % IV SOLN: 1000.0000 mL | Freq: Once | INTRAVENOUS | Status: AC

## 2012-11-05 MED ORDER — SODIUM CHLORIDE 0.9 % IV SOLN
Freq: Once | INTRAVENOUS | Status: AC
  Administered 2012-11-05: 12:00:00 via INTRAVENOUS

## 2012-11-05 MED ORDER — IOHEXOL 300 MG/ML  SOLN
50.0000 mL | Freq: Once | INTRAMUSCULAR | Status: AC | PRN
  Administered 2012-11-05: 50 mL via ORAL

## 2012-11-05 NOTE — ED Provider Notes (Signed)
History     This chart was scribed for Gerhard Munch, MD, MD by Smitty Pluck, ED Scribe. The patient was :22seen in room [facility]12/[facility]12 and the patient's care was started at 1:22 PM   CSN: [id number]  Arrival date & time 11/05/12  1146       Chief Complaint  Patient presents with  . Abdominal Pain    The history is provided by the patient. No language interpreter was used.   Kathleen Hammond is a 57 y.o. female with hx of alpha-1-antitrypsin deficiency who presents to the Emergency Department complaining of constant, moderate diffuse abdominal pain onset 3 days ago. Pt reports that she has abdominal cramping. She was seen at Urgent Care diagnosed with diverticulitis and given Cipro and Flagyl without relief. She states she has associated nausea and watery diarrhea. Pt reports that she has decreased appetite. Pt denies hematuria, confusion, dysuria, chest pain, fever, chills, vomiting, diarrhea, weakness, cough, SOB and any other pain. She denies sick contact. She has hx of 3 c-sections and cholecystectomy.    Past Medical History  Diagnosis Date  . Asthma   . Postmenopausal   . Perimenopausal vasomotor symptoms   . Urinary incontinence   . Alpha-1-antitrypsin deficiency   . History of anemia     Past Surgical History  Procedure Laterality Date  . Cholecystectomy    . Lump removed from the breast    . Tubal ligation    . Cyst removed from the gum    . Cesarean section    . Dilation and curettage of uterus    . Gallbladder surgery      Family History  Problem Relation Age of Onset  . Hypertension    . Heart disease    . Stroke    . Heart attack Father 106    massive heart attack  . Hypertension Father   . Cancer Mother     sinus   . Anemia Mother     History  Substance Use Topics  . Smoking status: Never Smoker   . Smokeless tobacco: Never Used  . Alcohol Use: Yes     Comment: occasional    OB History   Grav Para Term Preterm Abortions TAB SAB Ect Mult Living                Review of Systems  Constitutional:       Per HPI, otherwise negative  HENT:       Per HPI, otherwise negative  Respiratory:       Per HPI, otherwise negative  Cardiovascular:       Per HPI, otherwise negative  Endocrine:       Negative aside from HPI  Genitourinary:       Neg aside from HPI   Musculoskeletal:       Per HPI, otherwise negative  Skin: Negative.   Neurological: Negative for syncope.  All other systems reviewed and are negative.    Allergies  Cefuroxime axetil; Iodine; Avelox; Macrodantin; Bactrim; and Ceclor  Home Medications   Current Outpatient Rx  Name  Route  Sig  Dispense  Refill  . atorvastatin (LIPITOR) 10 MG tablet   Oral   Take 10 mg by mouth daily.         Marland Kitchen PARoxetine (PAXIL) 10 MG tablet   Oral   Take 1 tablet by mouth at bedtime.          Marland Kitchen SINGULAIR 10 MG tablet   Oral  Take 1 tablet by mouth at bedtime.          Marland Kitchen albuterol (PROVENTIL HFA;VENTOLIN HFA) 108 (90 BASE) MCG/ACT inhaler   Inhalation   Inhale 2 puffs into the lungs as needed (for asthma).          . Budesonide-Formoterol Fumarate (SYMBICORT IN)   Inhalation   Inhale 2 puffs into the lungs daily as needed (for asthma).          . ergocalciferol (VITAMIN D2) 50000 UNITS capsule   Oral   Take 50,000 Units by mouth once a week.            BP 117/74  Pulse 104  Temp(Src) 98.2 F (36.8 C) (Oral)  Resp 16  Ht 5\' 6"  (1.676 m)  Wt 178 lb (80.74 kg)  BMI 28.74 kg/m2  SpO2 96%  Physical Exam  Nursing note and vitals reviewed. Constitutional: She is oriented to person, place, and time. She appears well-developed and well-nourished. No distress.  HENT:  Head: Normocephalic and atraumatic.  Eyes: EOM are normal. Pupils are equal, round, and reactive to light.  Neck: Normal range of motion. Neck supple.  Cardiovascular: Normal rate, regular rhythm and normal heart sounds.   Pulmonary/Chest: Effort normal. No respiratory distress. She has no  wheezes. She has no rales.  Abdominal: Bowel sounds are normal. There is tenderness.  Diffuse tenderness to palpation with more tenderness on left.   Neurological: She is alert and oriented to person, place, and time.  Skin: Skin is warm and dry.  Psychiatric: She has a normal mood and affect. Her behavior is normal.    ED Course  Procedures (including critical care time) DIAGNOSTIC STUDIES: Oxygen Saturation is 96% on room air, adequate by my interpretation.    COORDINATION OF CARE: 1:25 PM Discussed ED treatment with pt and pt agrees.     Labs Reviewed - No data to display No results found.   No diagnosis found.   5:32 PM On repeat exam the patient appears calm.  We discussed all lab findings, radiographic findings. MDM  I personally performed the services described in this documentation, which was scribed in my presence. The recorded information has been reviewed and is accurate.  Patient presents with several days of abdominal pain, and outpatient diagnosis of diverticulitis.  Given the patient's description of symptoms that have not improved in spite of initiating antibiotic therapy, she had a CT scan performed.  CT scan was largely reassuring, but the patient's labs demonstrate concern for pancreatitis.  The patient is in no distress, afebrile, appropriate for outpatient management.  She has a gastroenterologist with whom she will follow up promptly.  Return precautions provided.  Analgesics provided.  Gerhard Munch, MD 11/05/12 [phone]

## 2012-11-05 NOTE — ED Notes (Signed)
Seen at Urgent Care on 11/02/12 and dx with diverticulitis.  C/o increased abd pain with n/v/d since.

## 2012-11-05 NOTE — ED Notes (Signed)
Pt dx with diverticulitis yesterday at Urgent Care, frequent diarrhea per pt and mild nausea, denies vomiting, has taken meds cipro and flagyl today

## 2012-11-07 ENCOUNTER — Encounter (HOSPITAL_COMMUNITY): Payer: BC Managed Care – PPO

## 2012-11-09 ENCOUNTER — Encounter (HOSPITAL_COMMUNITY): Payer: BC Managed Care – PPO

## 2012-11-14 ENCOUNTER — Encounter (HOSPITAL_COMMUNITY): Payer: BC Managed Care – PPO

## 2012-11-16 ENCOUNTER — Encounter (HOSPITAL_COMMUNITY): Payer: BC Managed Care – PPO

## 2013-02-22 ENCOUNTER — Other Ambulatory Visit: Payer: Self-pay

## 2013-02-22 DIAGNOSIS — Z1231 Encounter for screening mammogram for malignant neoplasm of breast: Secondary | ICD-10-CM

## 2013-03-10 ENCOUNTER — Ambulatory Visit: Payer: BC Managed Care – PPO

## 2013-03-31 ENCOUNTER — Ambulatory Visit
Admission: RE | Admit: 2013-03-31 | Discharge: 2013-03-31 | Disposition: A | Discharge status: Home / Self Care | Payer: BC Managed Care – PPO | Source: Ambulatory Visit

## 2013-03-31 DIAGNOSIS — Z1231 Encounter for screening mammogram for malignant neoplasm of breast: Secondary | ICD-10-CM

## 2013-05-12 LAB — HEPATIC FUNCTION PANEL
ALT: 26 (ref 7–35)
AST: 23 (ref 13–35)
Alkaline Phosphatase: 76 (ref 25–125)

## 2013-05-12 LAB — BASIC METABOLIC PANEL
BUN: 17 (ref 4–21)
Creatinine: 0.8 (ref <=1.1)
Glucose: 88
Potassium: 4.1 (ref 3.4–5.3)
Sodium: 141 (ref 137–147)

## 2013-05-12 LAB — CBC AND DIFFERENTIAL
HCT: 39 (ref 36–46)
Hemoglobin: 13.6 (ref 12.0–16.0)
Platelets: 208 (ref 150–399)
WBC: 3.2

## 2013-05-22 ENCOUNTER — Ambulatory Visit (HOSPITAL_COMMUNITY)
Admission: RE | Admit: 2013-05-22 | Discharge: 2013-05-22 | Disposition: A | Discharge status: Home / Self Care | Payer: BC Managed Care – PPO | Source: Ambulatory Visit | Attending: Pulmonary Disease | Admitting: Pulmonary Disease

## 2013-05-22 ENCOUNTER — Other Ambulatory Visit (HOSPITAL_COMMUNITY): Payer: Self-pay | Admitting: Pulmonary Disease

## 2013-05-22 DIAGNOSIS — Z8719 Personal history of other diseases of the digestive system: Secondary | ICD-10-CM

## 2013-05-22 DIAGNOSIS — R109 Unspecified abdominal pain: Secondary | ICD-10-CM

## 2013-05-22 DIAGNOSIS — R197 Diarrhea, unspecified: Secondary | ICD-10-CM

## 2013-05-22 DIAGNOSIS — R110 Nausea: Secondary | ICD-10-CM

## 2013-06-29 ENCOUNTER — Other Ambulatory Visit: Payer: Self-pay

## 2013-10-25 ENCOUNTER — Encounter (HOSPITAL_COMMUNITY): Payer: Self-pay

## 2013-10-30 ENCOUNTER — Encounter (HOSPITAL_COMMUNITY): Payer: Self-pay

## 2013-11-01 ENCOUNTER — Encounter (HOSPITAL_COMMUNITY)
Admission: RE | Admit: 2013-11-01 | Discharge: 2013-11-01 | Disposition: A | Discharge status: Home / Self Care | Payer: Self-pay | Source: Ambulatory Visit | Attending: Pulmonary Disease | Admitting: Pulmonary Disease

## 2013-11-01 DIAGNOSIS — J45909 Unspecified asthma, uncomplicated: Secondary | ICD-10-CM | POA: Insufficient documentation

## 2013-11-01 DIAGNOSIS — IMO0001 Reserved for inherently not codable concepts without codable children: Secondary | ICD-10-CM | POA: Insufficient documentation

## 2013-11-06 ENCOUNTER — Encounter (HOSPITAL_COMMUNITY)
Admission: RE | Admit: 2013-11-06 | Discharge: 2013-11-06 | Disposition: A | Discharge status: Home / Self Care | Payer: Self-pay | Source: Ambulatory Visit | Attending: Pulmonary Disease | Admitting: Pulmonary Disease

## 2013-11-08 ENCOUNTER — Encounter (HOSPITAL_COMMUNITY): Payer: Self-pay

## 2013-11-13 ENCOUNTER — Encounter (HOSPITAL_COMMUNITY): Payer: Self-pay

## 2013-11-15 ENCOUNTER — Encounter (HOSPITAL_COMMUNITY): Payer: Self-pay

## 2013-11-20 ENCOUNTER — Encounter (HOSPITAL_COMMUNITY): Payer: Self-pay

## 2013-11-21 ENCOUNTER — Other Ambulatory Visit (HOSPITAL_COMMUNITY): Payer: Self-pay

## 2013-11-21 DIAGNOSIS — J45901 Unspecified asthma with (acute) exacerbation: Secondary | ICD-10-CM

## 2013-11-22 ENCOUNTER — Encounter (HOSPITAL_COMMUNITY): Payer: Self-pay

## 2013-11-27 ENCOUNTER — Encounter (HOSPITAL_COMMUNITY): Payer: Self-pay

## 2013-11-29 ENCOUNTER — Encounter (HOSPITAL_COMMUNITY)
Admission: RE | Admit: 2013-11-29 | Discharge: 2013-11-29 | Disposition: A | Discharge status: Home / Self Care | Payer: Self-pay | Source: Ambulatory Visit | Attending: Pulmonary Disease | Admitting: Pulmonary Disease

## 2013-11-29 DIAGNOSIS — IMO0001 Reserved for inherently not codable concepts without codable children: Secondary | ICD-10-CM | POA: Insufficient documentation

## 2013-11-29 DIAGNOSIS — J45909 Unspecified asthma, uncomplicated: Secondary | ICD-10-CM | POA: Insufficient documentation

## 2013-12-04 ENCOUNTER — Encounter (HOSPITAL_COMMUNITY): Payer: Self-pay

## 2013-12-06 ENCOUNTER — Encounter (HOSPITAL_COMMUNITY): Payer: Self-pay

## 2013-12-11 ENCOUNTER — Encounter (HOSPITAL_COMMUNITY)
Admission: RE | Admit: 2013-12-11 | Discharge: 2013-12-11 | Disposition: A | Discharge status: Home / Self Care | Payer: Self-pay | Source: Ambulatory Visit | Attending: Pulmonary Disease | Admitting: Pulmonary Disease

## 2013-12-13 ENCOUNTER — Encounter (HOSPITAL_COMMUNITY): Payer: Self-pay

## 2013-12-13 ENCOUNTER — Encounter (HOSPITAL_COMMUNITY): Payer: BC Managed Care – PPO

## 2013-12-18 ENCOUNTER — Encounter (HOSPITAL_COMMUNITY): Payer: Self-pay

## 2013-12-19 ENCOUNTER — Ambulatory Visit (HOSPITAL_COMMUNITY)
Admission: RE | Admit: 2013-12-19 | Discharge: 2013-12-19 | Disposition: A | Discharge status: Home / Self Care | Payer: BC Managed Care – PPO | Source: Ambulatory Visit | Attending: Pulmonary Disease | Admitting: Pulmonary Disease

## 2013-12-19 DIAGNOSIS — J45901 Unspecified asthma with (acute) exacerbation: Secondary | ICD-10-CM | POA: Insufficient documentation

## 2013-12-19 MED ORDER — ALBUTEROL SULFATE (2.5 MG/3ML) 0.083% IN NEBU
2.5000 mg | INHALATION_SOLUTION | Freq: Once | RESPIRATORY_TRACT | Status: AC
  Administered 2013-12-19: 2.5 mg via RESPIRATORY_TRACT

## 2013-12-20 ENCOUNTER — Encounter (HOSPITAL_COMMUNITY)
Admission: RE | Admit: 2013-12-20 | Discharge: 2013-12-20 | Disposition: A | Discharge status: Home / Self Care | Payer: Self-pay | Source: Ambulatory Visit | Attending: Pulmonary Disease | Admitting: Pulmonary Disease

## 2013-12-25 ENCOUNTER — Encounter (HOSPITAL_COMMUNITY)
Admission: RE | Admit: 2013-12-25 | Discharge: 2013-12-25 | Disposition: A | Discharge status: Home / Self Care | Payer: Self-pay | Source: Ambulatory Visit | Attending: Pulmonary Disease | Admitting: Pulmonary Disease

## 2013-12-25 DIAGNOSIS — IMO0001 Reserved for inherently not codable concepts without codable children: Secondary | ICD-10-CM | POA: Insufficient documentation

## 2013-12-25 DIAGNOSIS — J45909 Unspecified asthma, uncomplicated: Secondary | ICD-10-CM | POA: Insufficient documentation

## 2013-12-25 LAB — PULMONARY FUNCTION TEST
DL/VA % pred: 86 %
DL/VA: 4.34 ml/min/mmHg/L
DLCO COR: 18.28 ml/min/mmHg
DLCO UNC % PRED: 67 %
DLCO UNC: 18.28 ml/min/mmHg
DLCO cor % pred: 67 %
FEF 25-75 PRE: 1.81 L/s
FEF 25-75 Post: 2.21 L/sec
FEF2575-%Change-Post: 21 %
FEF2575-%PRED-POST: 89 %
FEF2575-%Pred-Pre: 73 %
FEV1-%Change-Post: 4 %
FEV1-%PRED-PRE: 85 %
FEV1-%Pred-Post: 89 %
FEV1-POST: 2.45 L
FEV1-Pre: 2.35 L
FEV1FVC-%Change-Post: 1 %
FEV1FVC-%Pred-Pre: 95 %
FEV6-%CHANGE-POST: 3 %
FEV6-%Pred-Post: 94 %
FEV6-%Pred-Pre: 91 %
FEV6-PRE: 3.12 L
FEV6-Post: 3.23 L
FEV6FVC-%CHANGE-POST: 0 %
FEV6FVC-%PRED-POST: 104 %
FEV6FVC-%PRED-PRE: 103 %
FVC-%Change-Post: 2 %
FVC-%PRED-POST: 90 %
FVC-%Pred-Pre: 88 %
FVC-Post: 3.23 L
FVC-Pre: 3.14 L
PRE FEV6/FVC RATIO: 100 %
Post FEV1/FVC ratio: 76 %
Post FEV6/FVC ratio: 100 %
Pre FEV1/FVC ratio: 75 %
RV % PRED: 83 %
RV: 1.75 L
TLC % PRED: 89 %
TLC: 4.79 L

## 2013-12-27 ENCOUNTER — Encounter (HOSPITAL_COMMUNITY): Payer: Self-pay

## 2014-01-01 ENCOUNTER — Encounter (HOSPITAL_COMMUNITY): Payer: Self-pay

## 2014-01-03 ENCOUNTER — Encounter (HOSPITAL_COMMUNITY): Payer: Self-pay

## 2014-01-08 ENCOUNTER — Encounter (HOSPITAL_COMMUNITY): Payer: Self-pay

## 2014-01-10 ENCOUNTER — Encounter (HOSPITAL_COMMUNITY): Payer: Self-pay

## 2014-01-15 ENCOUNTER — Encounter (HOSPITAL_COMMUNITY): Payer: Self-pay

## 2014-01-17 ENCOUNTER — Encounter (HOSPITAL_COMMUNITY): Payer: Self-pay

## 2014-01-22 ENCOUNTER — Encounter (HOSPITAL_COMMUNITY): Payer: Self-pay

## 2014-01-24 ENCOUNTER — Encounter (HOSPITAL_COMMUNITY): Payer: Self-pay

## 2014-01-29 ENCOUNTER — Encounter (HOSPITAL_COMMUNITY): Payer: Self-pay

## 2014-01-31 ENCOUNTER — Encounter (HOSPITAL_COMMUNITY): Payer: Self-pay

## 2014-02-05 ENCOUNTER — Encounter (HOSPITAL_COMMUNITY): Payer: Self-pay

## 2014-04-25 ENCOUNTER — Other Ambulatory Visit: Payer: Self-pay

## 2014-04-25 DIAGNOSIS — Z1231 Encounter for screening mammogram for malignant neoplasm of breast: Secondary | ICD-10-CM

## 2014-05-01 LAB — CBC AND DIFFERENTIAL
HCT: 42 (ref 36–46)
Hemoglobin: 14.5 (ref 12.0–16.0)
Platelets: 223 (ref 150–399)
WBC: 3.3

## 2014-05-01 LAB — BASIC METABOLIC PANEL: Glucose: 89

## 2014-05-01 LAB — LIPID PANEL
Cholesterol: 230 — AB (ref 0–200)
HDL: 55 (ref 35–70)
LDL Cholesterol: 131
Triglycerides: 221 — AB (ref 40–160)

## 2014-05-01 LAB — HEPATIC FUNCTION PANEL
ALT: 17 (ref 7–35)
AST: 19 (ref 13–35)
Alkaline Phosphatase: 75 (ref 25–125)

## 2014-05-04 ENCOUNTER — Ambulatory Visit
Admission: RE | Admit: 2014-05-04 | Discharge: 2014-05-04 | Disposition: A | Discharge status: Home / Self Care | Payer: BC Managed Care – PPO | Source: Ambulatory Visit

## 2014-05-04 DIAGNOSIS — Z1231 Encounter for screening mammogram for malignant neoplasm of breast: Secondary | ICD-10-CM

## 2014-05-24 HISTORY — PX: CATARACT EXTRACTION: SUR2

## 2014-08-24 HISTORY — PX: YAG LASER APPLICATION: SHX6189

## 2014-11-14 LAB — LIPID PANEL
Cholesterol: 188 (ref 0–200)
HDL: 56 (ref 35–70)
LDL Cholesterol: 103
Triglycerides: 147 (ref 40–160)

## 2014-11-14 LAB — HEPATIC FUNCTION PANEL
ALT: 17 (ref 7–35)
AST: 17 (ref 13–35)
Alkaline Phosphatase: 68 (ref 25–125)

## 2014-11-14 LAB — CBC AND DIFFERENTIAL
HCT: 44 (ref 36–46)
Hemoglobin: 14.7 (ref 12.0–16.0)
Platelets: 233 (ref 150–399)
WBC: 3.2

## 2015-05-22 ENCOUNTER — Other Ambulatory Visit (HOSPITAL_COMMUNITY): Payer: Self-pay | Admitting: Respiratory Therapy

## 2015-05-22 DIAGNOSIS — J45909 Unspecified asthma, uncomplicated: Secondary | ICD-10-CM

## 2015-06-06 ENCOUNTER — Other Ambulatory Visit: Payer: Self-pay

## 2015-06-06 DIAGNOSIS — Z1231 Encounter for screening mammogram for malignant neoplasm of breast: Secondary | ICD-10-CM

## 2015-06-07 LAB — LIPID PANEL
Cholesterol: 248 — AB (ref 0–200)
HDL: 67 (ref 35–70)
LDL Cholesterol: 146
Triglycerides: 176 — AB (ref 40–160)

## 2015-06-07 LAB — CBC AND DIFFERENTIAL
HCT: 41 (ref 36–46)
Hemoglobin: 13.7 (ref 12.0–16.0)
Platelets: 219 (ref 150–399)
WBC: 5.9

## 2015-06-07 LAB — VITAMIN D 25 HYDROXY (VIT D DEFICIENCY, FRACTURES): Vit D, 25-Hydroxy: 19.8

## 2015-06-07 LAB — HEPATIC FUNCTION PANEL
ALT: 17 (ref 7–35)
AST: 17 (ref 13–35)
Alkaline Phosphatase: 78 (ref 25–125)

## 2015-06-07 LAB — TSH: TSH: 1.6 (ref <=5.90)

## 2015-06-14 ENCOUNTER — Ambulatory Visit (HOSPITAL_COMMUNITY): Payer: BC Managed Care – PPO | Attending: Pulmonary Disease

## 2015-07-05 ENCOUNTER — Ambulatory Visit
Admission: RE | Admit: 2015-07-05 | Discharge: 2015-07-05 | Disposition: A | Discharge status: Home / Self Care | Payer: BC Managed Care – PPO | Source: Ambulatory Visit

## 2015-07-05 DIAGNOSIS — Z1231 Encounter for screening mammogram for malignant neoplasm of breast: Secondary | ICD-10-CM

## 2015-11-08 ENCOUNTER — Encounter (HOSPITAL_COMMUNITY): Payer: BC Managed Care – PPO

## 2015-11-13 ENCOUNTER — Encounter (HOSPITAL_COMMUNITY): Payer: BC Managed Care – PPO

## 2015-11-27 ENCOUNTER — Ambulatory Visit (HOSPITAL_COMMUNITY)
Admission: RE | Admit: 2015-11-27 | Discharge: 2015-11-27 | Disposition: A | Discharge status: Home / Self Care | Payer: BC Managed Care – PPO | Source: Ambulatory Visit | Attending: Pulmonary Disease | Admitting: Pulmonary Disease

## 2015-11-27 DIAGNOSIS — J45909 Unspecified asthma, uncomplicated: Secondary | ICD-10-CM | POA: Insufficient documentation

## 2015-11-27 LAB — PULMONARY FUNCTION TEST
DL/VA % PRED: 76 %
DL/VA: 3.84 ml/min/mmHg/L
DLCO UNC % PRED: 59 %
DLCO UNC: 16.15 ml/min/mmHg
FEF 25-75 POST: 2.21 L/s
FEF 25-75 PRE: 1.71 L/s
FEF2575-%Change-Post: 28 %
FEF2575-%PRED-POST: 93 %
FEF2575-%PRED-PRE: 72 %
FEV1-%Change-Post: 8 %
FEV1-%PRED-PRE: 84 %
FEV1-%Pred-Post: 91 %
FEV1-Post: 2.46 L
FEV1-Pre: 2.27 L
FEV1FVC-%Change-Post: 8 %
FEV1FVC-%PRED-PRE: 92 %
FEV6-%CHANGE-POST: 0 %
FEV6-%PRED-POST: 93 %
FEV6-%PRED-PRE: 93 %
FEV6-POST: 3.16 L
FEV6-Pre: 3.15 L
FEV6FVC-%PRED-POST: 103 %
FEV6FVC-%Pred-Pre: 103 %
FVC-%Change-Post: 0 %
FVC-%PRED-POST: 90 %
FVC-%PRED-PRE: 90 %
FVC-POST: 3.16 L
FVC-PRE: 3.15 L
POST FEV6/FVC RATIO: 100 %
PRE FEV1/FVC RATIO: 72 %
Post FEV1/FVC ratio: 78 %
Pre FEV6/FVC Ratio: 100 %
RV % pred: 105 %
RV: 2.24 L
TLC % PRED: 101 %
TLC: 5.41 L

## 2015-11-27 MED ORDER — ALBUTEROL SULFATE (2.5 MG/3ML) 0.083% IN NEBU
2.5000 mg | INHALATION_SOLUTION | Freq: Once | RESPIRATORY_TRACT | Status: AC
  Administered 2015-11-27: 2.5 mg via RESPIRATORY_TRACT

## 2015-12-10 LAB — LIPID PANEL
Cholesterol: 220 — AB (ref 0–200)
HDL: 74 — AB (ref 35–70)
LDL Cholesterol: 117
Triglycerides: 146 (ref 40–160)

## 2015-12-10 LAB — VITAMIN D 25 HYDROXY (VIT D DEFICIENCY, FRACTURES): Vit D, 25-Hydroxy: 20.8

## 2015-12-10 LAB — HEPATIC FUNCTION PANEL
ALT: 34 (ref 7–35)
AST: 20 (ref 13–35)
Alkaline Phosphatase: 86 (ref 25–125)

## 2016-03-11 LAB — HM HEPATITIS C SCREENING LAB: HM Hepatitis Screen: NEGATIVE

## 2016-06-12 LAB — LIPID PANEL
Cholesterol: 198 (ref 0–200)
HDL: 60 (ref 35–70)
LDL Cholesterol: 96
Triglycerides: 210 — AB (ref 40–160)

## 2016-06-12 LAB — BASIC METABOLIC PANEL WITH GFR
BUN: 11 (ref 4–21)
Creatinine: 0.8 (ref <=1.1)
Glucose: 89

## 2016-06-12 LAB — VITAMIN D 25 HYDROXY (VIT D DEFICIENCY, FRACTURES): Vit D, 25-Hydroxy: 26.5

## 2016-09-22 ENCOUNTER — Other Ambulatory Visit: Payer: Self-pay | Admitting: Obstetrics and Gynecology

## 2016-09-22 DIAGNOSIS — Z1231 Encounter for screening mammogram for malignant neoplasm of breast: Secondary | ICD-10-CM

## 2016-10-21 ENCOUNTER — Ambulatory Visit: Payer: BC Managed Care – PPO

## 2016-11-06 ENCOUNTER — Ambulatory Visit
Admission: RE | Admit: 2016-11-06 | Discharge: 2016-11-06 | Disposition: A | Discharge status: Home / Self Care | Payer: BC Managed Care – PPO | Source: Ambulatory Visit | Attending: Obstetrics and Gynecology | Admitting: Obstetrics and Gynecology

## 2016-11-06 ENCOUNTER — Ambulatory Visit: Payer: BC Managed Care – PPO

## 2016-11-06 DIAGNOSIS — Z1231 Encounter for screening mammogram for malignant neoplasm of breast: Secondary | ICD-10-CM

## 2016-11-12 ENCOUNTER — Ambulatory Visit (INDEPENDENT_AMBULATORY_CARE_PROVIDER_SITE_OTHER): Payer: BC Managed Care – PPO | Admitting: Otolaryngology

## 2016-11-12 DIAGNOSIS — J343 Hypertrophy of nasal turbinates: Secondary | ICD-10-CM

## 2016-11-12 DIAGNOSIS — J342 Deviated nasal septum: Secondary | ICD-10-CM | POA: Diagnosis not present

## 2016-11-12 DIAGNOSIS — J310 Chronic rhinitis: Secondary | ICD-10-CM | POA: Diagnosis not present

## 2016-11-12 DIAGNOSIS — H9209 Otalgia, unspecified ear: Secondary | ICD-10-CM

## 2016-11-13 ENCOUNTER — Other Ambulatory Visit (INDEPENDENT_AMBULATORY_CARE_PROVIDER_SITE_OTHER): Payer: Self-pay | Admitting: Otolaryngology

## 2016-11-13 DIAGNOSIS — J320 Chronic maxillary sinusitis: Secondary | ICD-10-CM

## 2016-11-20 ENCOUNTER — Ambulatory Visit (HOSPITAL_COMMUNITY)
Admission: RE | Admit: 2016-11-20 | Discharge: 2016-11-20 | Disposition: A | Discharge status: Home / Self Care | Payer: BC Managed Care – PPO | Source: Ambulatory Visit | Attending: Otolaryngology | Admitting: Otolaryngology

## 2016-11-20 DIAGNOSIS — J3489 Other specified disorders of nose and nasal sinuses: Secondary | ICD-10-CM | POA: Diagnosis not present

## 2016-11-20 DIAGNOSIS — J320 Chronic maxillary sinusitis: Secondary | ICD-10-CM

## 2016-12-10 ENCOUNTER — Ambulatory Visit (INDEPENDENT_AMBULATORY_CARE_PROVIDER_SITE_OTHER): Payer: BC Managed Care – PPO | Admitting: Otolaryngology

## 2016-12-10 DIAGNOSIS — H9313 Tinnitus, bilateral: Secondary | ICD-10-CM

## 2016-12-10 DIAGNOSIS — J310 Chronic rhinitis: Secondary | ICD-10-CM

## 2016-12-10 DIAGNOSIS — J342 Deviated nasal septum: Secondary | ICD-10-CM | POA: Diagnosis not present

## 2016-12-10 DIAGNOSIS — H903 Sensorineural hearing loss, bilateral: Secondary | ICD-10-CM | POA: Diagnosis not present

## 2017-01-12 LAB — LIPID PANEL
Cholesterol: 198 (ref 0–200)
HDL: 62 (ref 35–70)
LDL Cholesterol: 104
Triglycerides: 159 (ref 40–160)

## 2017-01-12 LAB — CBC AND DIFFERENTIAL
HCT: 40 (ref 36–46)
Hemoglobin: 13.3 (ref 12.0–16.0)
Platelets: 235 (ref 150–399)
WBC: 3.7

## 2017-01-12 LAB — BASIC METABOLIC PANEL
BUN: 14 (ref 4–21)
Creatinine: 0.8 (ref <=1.1)
Glucose: 84
Potassium: 4.2 (ref 3.4–5.3)

## 2017-01-12 LAB — TSH: TSH: 1.09 (ref <=5.90)

## 2017-01-12 LAB — VITAMIN D 25 HYDROXY (VIT D DEFICIENCY, FRACTURES): Vit D, 25-Hydroxy: 26.6

## 2017-02-23 ENCOUNTER — Other Ambulatory Visit: Payer: Self-pay | Admitting: Sports Medicine

## 2017-02-23 DIAGNOSIS — M75100 Unspecified rotator cuff tear or rupture of unspecified shoulder, not specified as traumatic: Secondary | ICD-10-CM

## 2017-03-08 ENCOUNTER — Other Ambulatory Visit: Payer: BC Managed Care – PPO

## 2017-08-24 LAB — HM DEXA SCAN

## 2017-09-02 LAB — HEPATIC FUNCTION PANEL
ALT: 18 (ref 7–35)
AST: 23 (ref 13–35)
Alkaline Phosphatase: 72 (ref 25–125)

## 2017-09-02 LAB — BASIC METABOLIC PANEL
BUN: 12 (ref 4–21)
Creatinine: 0.8 (ref <=1.1)
Glucose: 89
Potassium: 4.4 (ref 3.4–5.3)
Sodium: 143 (ref 137–147)

## 2017-09-02 LAB — LIPID PANEL
Cholesterol: 228 — AB (ref 0–200)
HDL: 43 (ref 35–70)
LDL Cholesterol: 121
Triglycerides: 215 — AB (ref 40–160)

## 2017-09-02 LAB — CBC AND DIFFERENTIAL
HCT: 42 (ref 36–46)
Hemoglobin: 13.9 (ref 12.0–16.0)
Platelets: 263 (ref 150–399)
WBC: 4.6

## 2017-11-08 ENCOUNTER — Other Ambulatory Visit: Payer: Self-pay | Admitting: Obstetrics and Gynecology

## 2017-11-08 DIAGNOSIS — Z139 Encounter for screening, unspecified: Secondary | ICD-10-CM

## 2017-12-03 ENCOUNTER — Ambulatory Visit
Admission: RE | Admit: 2017-12-03 | Discharge: 2017-12-03 | Disposition: A | Discharge status: Home / Self Care | Payer: BC Managed Care – PPO | Source: Ambulatory Visit | Attending: Obstetrics and Gynecology | Admitting: Obstetrics and Gynecology

## 2017-12-03 DIAGNOSIS — Z139 Encounter for screening, unspecified: Secondary | ICD-10-CM

## 2018-01-14 LAB — LIPID PANEL
Cholesterol: 199 (ref 0–200)
HDL: 64 (ref 35–70)
LDL Cholesterol: 100
Triglycerides: 173 — AB (ref 40–160)

## 2018-01-14 LAB — HEPATIC FUNCTION PANEL
ALT: 14 (ref 7–35)
AST: 16 (ref 13–35)
Alkaline Phosphatase: 59 (ref 25–125)
Bilirubin, Direct: 0.16 (ref 0.01–0.4)
Bilirubin, Total: 0.6

## 2018-01-14 LAB — VITAMIN D 25 HYDROXY (VIT D DEFICIENCY, FRACTURES): Vit D, 25-Hydroxy: 27.7

## 2018-05-12 LAB — LIPID PANEL
Cholesterol: 174 (ref 0–200)
HDL: 50 (ref 35–70)
LDL Cholesterol: 91
Triglycerides: 164 — AB (ref 40–160)

## 2018-07-13 ENCOUNTER — Other Ambulatory Visit (HOSPITAL_COMMUNITY): Payer: Self-pay | Admitting: Respiratory Therapy

## 2018-07-13 DIAGNOSIS — R0602 Shortness of breath: Secondary | ICD-10-CM

## 2018-08-03 ENCOUNTER — Ambulatory Visit (HOSPITAL_COMMUNITY)
Admission: RE | Admit: 2018-08-03 | Discharge: 2018-08-03 | Disposition: A | Discharge status: Home / Self Care | Payer: BC Managed Care – PPO | Source: Ambulatory Visit | Attending: Pulmonary Disease | Admitting: Pulmonary Disease

## 2018-08-03 DIAGNOSIS — R0602 Shortness of breath: Secondary | ICD-10-CM | POA: Diagnosis present

## 2018-08-03 LAB — PULMONARY FUNCTION TEST
DL/VA % pred: 65 %
DL/VA: 3.3 ml/min/mmHg/L
DLCO UNC % PRED: 54 %
DLCO unc: 14.58 ml/min/mmHg
FEF 25-75 POST: 2.25 L/s
FEF 25-75 Pre: 1.84 L/sec
FEF2575-%Change-Post: 22 %
FEF2575-%Pred-Post: 98 %
FEF2575-%Pred-Pre: 80 %
FEV1-%Change-Post: 4 %
FEV1-%PRED-POST: 92 %
FEV1-%PRED-PRE: 89 %
FEV1-Post: 2.45 L
FEV1-Pre: 2.35 L
FEV1FVC-%Change-Post: 1 %
FEV1FVC-%Pred-Pre: 95 %
FEV6-%Change-Post: 2 %
FEV6-%PRED-PRE: 96 %
FEV6-%Pred-Post: 98 %
FEV6-POST: 3.25 L
FEV6-Pre: 3.18 L
FEV6FVC-%CHANGE-POST: 0 %
FEV6FVC-%PRED-PRE: 103 %
FEV6FVC-%Pred-Post: 103 %
FVC-%CHANGE-POST: 3 %
FVC-%PRED-PRE: 92 %
FVC-%Pred-Post: 95 %
FVC-Post: 3.29 L
FVC-Pre: 3.18 L
POST FEV1/FVC RATIO: 75 %
POST FEV6/FVC RATIO: 100 %
Pre FEV1/FVC ratio: 74 %
Pre FEV6/FVC Ratio: 100 %
RV % pred: 94 %
RV: 2.04 L
TLC % PRED: 96 %
TLC: 5.16 L

## 2018-08-03 MED ORDER — ALBUTEROL SULFATE (2.5 MG/3ML) 0.083% IN NEBU
2.5000 mg | INHALATION_SOLUTION | Freq: Once | RESPIRATORY_TRACT | Status: AC
  Administered 2018-08-03: 2.5 mg via RESPIRATORY_TRACT

## 2018-12-30 ENCOUNTER — Other Ambulatory Visit: Payer: Self-pay | Admitting: Obstetrics and Gynecology

## 2018-12-30 DIAGNOSIS — Z1231 Encounter for screening mammogram for malignant neoplasm of breast: Secondary | ICD-10-CM

## 2019-04-17 ENCOUNTER — Other Ambulatory Visit: Payer: Self-pay

## 2019-04-17 ENCOUNTER — Ambulatory Visit
Admission: RE | Admit: 2019-04-17 | Discharge: 2019-04-17 | Disposition: A | Discharge status: Home / Self Care | Payer: Medicare Other | Source: Ambulatory Visit | Attending: Obstetrics and Gynecology | Admitting: Obstetrics and Gynecology

## 2019-04-17 DIAGNOSIS — Z1231 Encounter for screening mammogram for malignant neoplasm of breast: Secondary | ICD-10-CM

## 2019-06-06 ENCOUNTER — Telehealth: Payer: Self-pay | Admitting: Pulmonary Disease

## 2019-06-06 NOTE — Telephone Encounter (Signed)
Call returned to patient she states she is very nervous about having alpha-1 and coming into a medical office currently during this covid-19 pandemic. I made here aware that very 1st visit will need to be in person. I also made her aware we have placed extra safety measures in place to keep everyone safe. I also made her aware she could always wait in her car if she wanted. Voiced understanding. Nothing further needed at this time.

## 2019-06-14 ENCOUNTER — Ambulatory Visit (INDEPENDENT_AMBULATORY_CARE_PROVIDER_SITE_OTHER): Payer: Medicare Other

## 2019-06-14 ENCOUNTER — Other Ambulatory Visit: Payer: Self-pay

## 2019-06-14 ENCOUNTER — Encounter: Payer: Self-pay | Admitting: Pulmonary Disease

## 2019-06-14 ENCOUNTER — Ambulatory Visit: Payer: Medicare Other | Admitting: Pulmonary Disease

## 2019-06-14 VITALS — BP 118/76 | HR 80 | Temp 97.1°F | Ht 65.0 in | Wt 174.8 lb

## 2019-06-14 DIAGNOSIS — E8801 Alpha-1-antitrypsin deficiency: Secondary | ICD-10-CM

## 2019-06-14 LAB — CBC WITH DIFFERENTIAL/PLATELET
Basophils Absolute: 0.1 10*3/uL (ref 0.0–0.1)
Basophils Relative: 1.9 % (ref 0.0–3.0)
Eosinophils Absolute: 0.4 10*3/uL (ref 0.0–0.7)
Eosinophils Relative: 8.8 % — ABNORMAL HIGH (ref 0.0–5.0)
HCT: 39 % (ref 36.0–46.0)
Hemoglobin: 13.2 g/dL (ref 12.0–15.0)
Lymphocytes Relative: 31.1 % (ref 12.0–46.0)
Lymphs Abs: 1.4 10*3/uL (ref 0.7–4.0)
MCHC: 33.9 g/dL (ref 30.0–36.0)
MCV: 87.8 fl (ref 78.0–100.0)
Monocytes Absolute: 0.4 10*3/uL (ref 0.1–1.0)
Monocytes Relative: 9 % (ref 3.0–12.0)
Neutro Abs: 2.3 10*3/uL (ref 1.4–7.7)
Neutrophils Relative %: 49.2 % (ref 43.0–77.0)
Platelets: 224 10*3/uL (ref 150.0–400.0)
RBC: 4.45 Mil/uL (ref 3.87–5.11)
RDW: 12.7 % (ref 11.5–15.5)
WBC: 4.6 10*3/uL (ref 4.0–10.5)

## 2019-06-14 LAB — COMPREHENSIVE METABOLIC PANEL
ALT: 27 U/L (ref 0–35)
AST: 27 U/L (ref 0–37)
Albumin: 4.7 g/dL (ref 3.5–5.2)
Alkaline Phosphatase: 61 U/L (ref 39–117)
BUN: 11 mg/dL (ref 6–23)
CO2: 31 mEq/L (ref 19–32)
Calcium: 9.7 mg/dL (ref 8.4–10.5)
Chloride: 104 mEq/L (ref 96–112)
Creatinine, Ser: 0.8 mg/dL (ref 0.40–1.20)
GFR: 71.82 mL/min (ref >60.00)
Glucose, Bld: 106 mg/dL — ABNORMAL HIGH (ref 70–99)
Potassium: 4.1 mEq/L (ref 3.5–5.1)
Sodium: 142 mEq/L (ref 135–145)
Total Bilirubin: 0.6 mg/dL (ref 0.2–1.2)
Total Protein: 7.2 g/dL (ref 6.0–8.3)

## 2019-06-14 MED ORDER — PREDNISONE 20 MG PO TABS: 40.0000 mg | ORAL_TABLET | Freq: Every day | ORAL | 0 refills | Status: DC

## 2019-06-14 MED ORDER — AZITHROMYCIN 250 MG PO TABS: ORAL_TABLET | ORAL | 0 refills | Status: DC

## 2019-06-14 NOTE — Patient Instructions (Signed)
We will get a chest x-ray today Check labs including CBC differential, alpha-1 antitrypsin levels and phenotype, comprehensive metabolic panel We will call in a Z-Pak and prednisone 40 mg a day for 5 days.  Hold those in reserve in case your breathing gets worse  Please check with your primary care if you have already received the Pneumovax vaccine  Follow-up in 6 months with pulmonary function tests

## 2019-06-14 NOTE — Progress Notes (Signed)
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  Diagnosed over 10 years ago.  She was previously being followed by Dr. Juanetta Gosling and is seeking a new primary provider due to retirement.  Has been asymptomatic with no inhaler therapy.  She was also evaluated at [facility] and advised that she did not need replacement therapy.  Has history of asthma which appears to be mild.  Previously on Symbicort but currently on albuterol with good control of symptoms  Pets: No pets Occupation: Retired school principal Exposures: No known exposures.  No mold, hot tub, Jacuzzi Smoking history: Smoker Travel history: Significant travel history Relevant family history: No significant family history of lung disease  Outpatient Encounter Medications as of 06/14/2019  Medication Sig  . albuterol (PROVENTIL HFA;VENTOLIN HFA) 108 (90 BASE) MCG/ACT inhaler Inhale 2 puffs into the lungs as needed (for asthma).   Marland Kitchen atorvastatin (LIPITOR) 10 MG tablet Take 10 mg by mouth daily.  . ergocalciferol (VITAMIN D2) 1.25 MG (50000 UT) capsule Take 50,000 Units by mouth once a week.   Marland Kitchen PANTOPRAZOLE SODIUM PO Take 40 mg by mouth daily.  Marland Kitchen SINGULAIR 10 MG tablet Take 1 tablet by mouth at bedtime.   . [DISCONTINUED] Budesonide-Formoterol Fumarate (SYMBICORT IN) Inhale 2 puffs into the lungs daily as needed (for asthma).   . [DISCONTINUED] ergocalciferol (VITAMIN D2) 50000 UNITS capsule Take 50,000 Units by mouth once a week.   . [DISCONTINUED] HYDROcodone-acetaminophen (NORCO/VICODIN) 5-325 MG per tablet Take 1 tablet by mouth every 8 (eight) hours as needed for pain.  . [DISCONTINUED] PARoxetine (PAXIL) 10 MG tablet Take 1 tablet by mouth at bedtime.    No facility-administered encounter medications on file as of 06/14/2019.     Allergies as of 06/14/2019 - Review Complete 06/14/2019  Allergen Reaction Noted  . Cefuroxime axetil Shortness Of Breath   . Iodine Shortness Of Breath   . Avelox [moxifloxacin hcl in nacl] Other (See Comments) 11/05/2012  . Macrodantin [nitrofurantoin macrocrystal] Nausea Only 11/05/2012  . Bactrim Rash   . Ceclor [cefaclor] Rash 11/05/2012    Past Medical History:  Diagnosis Date  . Alpha-1-antitrypsin deficiency (HCC)   . Asthma   . History of anemia   . Perimenopausal vasomotor symptoms   . Postmenopausal   . Urinary incontinence     Past Surgical History:  Procedure Laterality Date  . BREAST EXCISIONAL BIOPSY Right 1996  . CESAREAN SECTION    . CHOLECYSTECTOMY    . cyst removed from the gum    . DILATION AND CURETTAGE OF UTERUS    . GALLBLADDER SURGERY    . lump removed from the breast    . TUBAL LIGATION      Family History  Problem Relation Age of Onset  . Heart attack Father 64       massive heart attack  . Hypertension Father   . Cancer Mother        sinus   . Anemia Mother   . Hypertension Other   . Heart disease Other   . Stroke Other     Social History   Socioeconomic History  . Marital status: Married    Spouse name: Not on file  . Number of children: Not  on file  . Years of education: Not on file  . Highest education level: Not on file  Occupational History  . Not on file  Social Needs  . Financial resource strain: Not on file  . Food insecurity    Worry: Not on file    Inability: Not on file  . Transportation needs    Medical: Not on file    Non-medical: Not on file  Tobacco Use  . Smoking status: Never Smoker  . Smokeless tobacco: Never Used  Substance and Sexual Activity  . Alcohol use: Yes    Comment: occasional  . Drug use: No  . Sexual activity: Not on file  Lifestyle  . Physical activity    Days per week: Not on file    Minutes per session:  Not on file  . Stress: Not on file  Relationships  . Social Musician on phone: Not on file    Gets together: Not on file    Attends religious service: Not on file    Active member of club or organization: Not on file    Attends meetings of clubs or organizations: Not on file    Relationship status: Not on file  . Intimate partner violence    Fear of current or ex partner: Not on file    Emotionally abused: Not on file    Physically abused: Not on file    Forced sexual activity: Not on file  Other Topics Concern  . Not on file  Social History Narrative  . Not on file    Review of systems: Review of Systems  Constitutional: Negative for fever and chills.  HENT: Negative.   Eyes: Negative for blurred vision.  Respiratory: as per HPI  Cardiovascular: Negative for chest pain and palpitations.  Gastrointestinal: Negative for vomiting, diarrhea, blood per rectum. Genitourinary: Negative for dysuria, urgency, frequency and hematuria.  Musculoskeletal: Negative for myalgias, back pain and joint pain.  Skin: Negative for itching and rash.  Neurological: Negative for dizziness, tremors, focal weakness, seizures and loss of consciousness.  Endo/Heme/Allergies: Negative for environmental allergies.  Psychiatric/Behavioral: Negative for depression, suicidal ideas and hallucinations.  All other systems reviewed and are negative.  Physical Exam: Blood pressure 118/76, pulse 80, temperature (!) 97.1 F (36.2 C), temperature source Temporal, height 5\' 5"  (1.651 m), weight 174 lb 12.8 oz (79.3 kg), SpO2 98 %. Gen:      No acute distress HEENT:  EOMI, sclera anicteric Neck:     No masses; no thyromegaly Lungs:    Clear to auscultation bilaterally; normal respiratory effort CV:         Regular rate and rhythm; no murmurs Abd:      + bowel sounds; soft, non-tender; no palpable masses, no distension Ext:    No edema; adequate peripheral perfusion Skin:      Warm and dry; no rash  Neuro: alert and oriented x 3 Psych: normal mood and affect  Data Reviewed: Imaging: Chest x-ray 04/06/2012-no acute cardiopulmonary abnormality CT abdomen pelvis 05/22/13-visualized lung bases are normal. I have reviewed the images personally.  PFTs: 08/03/2018 FVC 3.29 [95%], FEV1 2.45 [92%], F/F 75, TLC 5.16 [96%], DLCO 14.58 [54%) No obstruction or restriction.  Moderate diffusion impairment.  Assessment:  Alpha-1 antitrypsin carrier status We will reevaluate with alpha-1 antitrypsin levels Check comprehensive metabolic panel to monitor LFTs and CBC with differential Chest x-ray today  Asthma, recurrent bronchitis Currently on just albuterol as needed Symptoms are under good control  She is requesting a Z-Pak and prednisone taper to be held in reserve in case she gets another episode of bronchitis this winter  Plan/Recommendations: - CBC with differential, comprehensive metabolic panel - PFTs, chest x-ray - Check alpha-1 antitrypsin levels.  Marshell Garfinkel MD [facility] 06/14/2019, 10:17 AM  CC: Sinda Du, MD

## 2019-06-19 DIAGNOSIS — E8801 Alpha-1-antitrypsin deficiency: Secondary | ICD-10-CM

## 2019-06-20 NOTE — Telephone Encounter (Signed)
Dr. Vaughan Browner please advise on pt's lab and CXR results.  Thanks.

## 2019-06-21 LAB — ALPHA-1 ANTITRYPSIN PHENOTYPE: A-1 Antitrypsin, Ser: 72 mg/dL — ABNORMAL LOW (ref 83–199)

## 2019-06-22 NOTE — Telephone Encounter (Signed)
Alpha 1 antitrypsin levels are slightly low. She does have elevated eosinophils which may indicate allergies. Other labs including liver tests and chest x ray are normal.  Please schedule PFTs and follow up in clinic in 3 months.

## 2019-06-22 NOTE — Telephone Encounter (Signed)
Let patient know Dr. Matilde Bash recommendations.  Placed order for Pft Recall placed for 3 months with Dr. Vaughan Browner.

## 2019-06-22 NOTE — Telephone Encounter (Signed)
Dr. Vaughan Browner patient sent email concerns about lab work   "Is the GFR a kidney function test? If so is that range on the low side of normal? Is there reason for concern? "  Dr. Vaughan Browner please advise

## 2019-06-24 ENCOUNTER — Encounter: Payer: Self-pay | Admitting: Family Medicine

## 2019-07-04 ENCOUNTER — Ambulatory Visit: Payer: Medicare Other | Admitting: Family Medicine

## 2019-08-28 ENCOUNTER — Ambulatory Visit: Payer: Medicare PPO | Admitting: "Endocrinology

## 2019-08-28 ENCOUNTER — Other Ambulatory Visit: Payer: Self-pay

## 2019-08-28 ENCOUNTER — Encounter: Payer: Self-pay | Admitting: "Endocrinology

## 2019-08-28 VITALS — BP 110/64 | HR 100 | Ht 65.0 in | Wt 173.8 lb

## 2019-08-28 DIAGNOSIS — M810 Age-related osteoporosis without current pathological fracture: Secondary | ICD-10-CM | POA: Diagnosis not present

## 2019-08-28 DIAGNOSIS — R739 Hyperglycemia, unspecified: Secondary | ICD-10-CM | POA: Diagnosis not present

## 2019-08-28 LAB — POCT GLYCOSYLATED HEMOGLOBIN (HGB A1C): Hemoglobin A1C: 5.3 % (ref 4.0–5.6)

## 2019-08-28 NOTE — Progress Notes (Signed)
08/28/2019       endocrinology Consulte Note  Consult  for osteoporosis  Past Medical History:  Diagnosis Date  . Alpha-1-antitrypsin deficiency (HCC)   . Asthma   . History of anemia   . Perimenopausal vasomotor symptoms   . Postmenopausal   . Urinary incontinence    Past Surgical History:  Procedure Laterality Date  . BREAST EXCISIONAL BIOPSY Right 1996  . CESAREAN SECTION    . CHOLECYSTECTOMY    . cyst removed from the gum    . DILATION AND CURETTAGE OF UTERUS    . GALLBLADDER SURGERY    . lump removed from the breast    . TUBAL LIGATION     Social History   Socioeconomic History  . Marital status: Married    Spouse name: Not on file  . Number of children: Not on file  . Years of education: Not on file  . Highest education level: Not on file  Occupational History  . Not on file  Tobacco Use  . Smoking status: Never Smoker  . Smokeless tobacco: Never Used  Substance and Sexual Activity  . Alcohol use: Yes    Comment: occasional  . Drug use: No  . Sexual activity: Not on file  Other Topics Concern  . Not on file  Social History Narrative  . Not on file   Social Determinants of Health   Financial Resource Strain:   . Difficulty of Paying Living Expenses: Not on file  Food Insecurity:   . Worried About Programme researcher, broadcasting/film/video in the Last Year: Not on file  . Ran Out of Food in the Last Year: Not on file  Transportation Needs:   . Lack of Transportation (Medical): Not on file  . Lack of Transportation (Non-Medical): Not on file  Physical Activity:   . Days of Exercise per Week: Not on file  . Minutes of Exercise per Session: Not on file  Stress:   . Feeling of Stress : Not on file  Social Connections:   . Frequency of Communication with Friends and Family: Not on file  . Frequency of Social Gatherings with Friends and Family: Not on file  . Attends Religious Services: Not on file  . Active Member  of Clubs or Organizations: Not on file  . Attends Banker Meetings: Not on file  . Marital Status: Not on file   Outpatient Encounter Medications as of 08/28/2019  Medication Sig  . albuterol (PROVENTIL HFA;VENTOLIN HFA) 108 (90 BASE) MCG/ACT inhaler Inhale 2 puffs into the lungs as needed (for asthma).   Marland Kitchen atorvastatin (LIPITOR) 10 MG tablet Take 10 mg by mouth daily.  Marland Kitchen azithromycin (ZITHROMAX) 250 MG tablet Please Take 2 tabs by mouth on day one and then 1 tab until complete  . ergocalciferol (VITAMIN D2) 1.25 MG (50000 UT) capsule Take 50,000 Units by mouth once a week.   Marland Kitchen PANTOPRAZOLE SODIUM PO Take 40 mg by mouth daily.  Marland Kitchen SINGULAIR 10 MG tablet Take 1 tablet by mouth at bedtime.   . [DISCONTINUED] predniSONE (DELTASONE) 20 MG tablet Take 2 tablets (40 mg total) by mouth daily with breakfast.   No facility-administered encounter medications on file as of 08/28/2019.   ALLERGIES: Allergies  Allergen Reactions  . Cefuroxime Axetil Shortness Of Breath  . Iodine Shortness Of Breath  . Avelox [Moxifloxacin Hcl In Nacl] Other (See Comments)    Hallucinations   . Macrodantin [Nitrofurantoin Macrocrystal] Nausea Only    Chills, aching  .  Bactrim Rash    Septra  . Ceclor [Cefaclor] Rash    VACCINATION STATUS: Immunization History  Administered Date(s) Administered  . Influenza Split 08/22/2015  . Influenza-Unspecified 07/08/2016, 06/17/2017, 07/13/2018  . Td 08/28/2011, 11/19/2015     HPI   Kathleen Hammond is 64 y.o. female who presents today with a medical history as above. she is being seen in consultation for osteoporosis requested by Benita Stabile, MD.  Patient was diagnosed with osteoporosis  approximately  10 years ago. She denies fractures or falls.   I reviewed pt's DEXA scans:  Jan 05, 2018 T score  AP spine (L1-L4) -2.0 Femoral neck (left) -2.6 Femoral neck (right) -2.5  Reviewed prior history, this is after treatment with Fosamax on and off for overall  duration of approximately 6 years, and Actonel for few months. -She reports that she had to stop bisphosphonates due to severe GI intolerance. -Her DEXA scans prior to that 2019 studies are not available to review. -She denies thyroid, parathyroid dysfunction.  She is on a regular supplement of vitamin D with vitamin D2 50,000 units twice a week. -She has taken calcium supplements before, not currently on calcium limits.  Her diet is average diet. -She reports losing approximately 1 inch of height over the years.  She went to menopause in her early 84s. No dizziness/vertigo/orthostasis. -She is not involved in vigorous exercise.  No h/o kidney stones. Lab Results  Component Value Date   CALCIUM 9.7 06/14/2019   CALCIUM 8.8 11/05/2012    No h/o thyrotoxicosis. Reviewed TSH recent levels:  Lab Results  Component Value Date   TSH 1.09 01/12/2017   TSH 1.60 06/07/2015   TSH 0.62 04/06/2012    No h/o CKD. Last BUN/Cr: Lab Results  Component Value Date   BUN 11 06/14/2019   CREATININE 0.80 06/14/2019   She has family history of osteoporosis in her mother who did have multiple fractures related to osteoporosis.  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  Over the last 15 to 20 years she did have average exposure of 3 times per year to steroids. -She does not have acute complaints today.  Review of Systems  Constitutional: + Minimally fluctuating body weight, no fatigue, no subjective hyperthermia, no subjective hypothermia Eyes: no blurry vision, no xerophthalmia ENT: no sore throat, no nodules palpated in throat, no dysphagia/odynophagia, no hoarseness Cardiovascular: no Chest Pain, no Shortness of Breath, no palpitations, no leg swelling Respiratory: no cough, no SOB Gastrointestinal: no Nausea/Vomiting/Diarhhea Musculoskeletal: no muscle/joint aches Skin: no rashes Neurological: no tremors, no numbness, no  tingling, no dizziness Psychiatric: no depression, no anxiety  Objective:    BP 110/64   Pulse 100   Ht 5\' 5"  (1.651 m)   Wt 173 lb 12.8 oz (78.8 kg)   BMI 28.92 kg/m   Wt Readings from Last 3 Encounters:  08/28/19 173 lb 12.8 oz (78.8 kg)  06/14/19 174 lb 12.8 oz (79.3 kg)  11/05/12 178 lb (80.7 kg)    Physical Exam  Constitutional: + BMI of 29.9, not in acute distress, normal state of mind Eyes: PERRLA, EOMI, no exophthalmos ENT: moist mucous membranes, no thyromegaly, no cervical lymphadenopathy Cardiovascular: normal precordial activity, Regular Rate and Rhythm, no Murmur/Rubs/Gallops Respiratory:  adequate breathing efforts, no gross chest deformity, Clear to auscultation bilaterally Gastrointestinal: abdomen soft, Non -tender, No distension, Bowel Sounds present Musculoskeletal: + Mild kyphosis of upper thoracic vertebral spine, no  Other gross deformities, strength intact in all four extremities Skin: moist, warm, no rashes Neurological: no tremor with outstretched hands, Deep tendon reflexes normal in all four extremities.  CMP ( most recent) CMP     Component Value Date/Time   NA 142 06/14/2019 1057   NA 143 09/02/2017 0000   K 4.1 06/14/2019 1057   CL 104 06/14/2019 1057   CO2 31 06/14/2019 1057   GLUCOSE 106 (H) 06/14/2019 1057   BUN 11 06/14/2019 1057   BUN 12 09/02/2017 0000   CREATININE 0.80 06/14/2019 1057   CALCIUM 9.7 06/14/2019 1057   PROT 7.2 06/14/2019 1057   ALBUMIN 4.7 06/14/2019 1057   AST 27 06/14/2019 1057   ALT 27 06/14/2019 1057   ALKPHOS 61 06/14/2019 1057   BILITOT 0.6 06/14/2019 1057   GFRNONAA >90 11/05/2012 1352   GFRAA >90 11/05/2012 1352     Diabetic Labs (most recent): Lab Results  Component Value Date   HGBA1C 5.3 08/28/2019     Lipid Panel ( most recent) Lipid Panel     Component Value Date/Time   CHOL 174 05/12/2018 0000   TRIG 164 (A) 05/12/2018 0000   HDL 50 05/12/2018 0000   LDLCALC 91 05/12/2018 0000       Lab Results  Component Value Date   TSH 1.09 01/12/2017   TSH 1.60 06/07/2015   TSH 0.62 04/06/2012      Assessment: 1. Osteoporosis  Plan: 1. Osteoporosis -Multiple risk factors, postmenopausal, chronic steroid treatment, family history.    - Discussed about increased risk of fracture, depending on the T score, greatly increased when the T score is lower than -2.5, but it is actually a continuum and -2.5 should not be regarded as an absolute threshold. We reviewed her DEXA scans together, and I explained that based on the T scores, she has an increased risk for fractures.  - We discussed about the different medication classes, benefits and side effects (including atypical fractures and ONJ - no dental workup in progress or planned).  -She has taken oral bisphosphonates for total of 6+ years, and did have side effects from these medications. -Because of his history, Kathleen Hammond is not a candidate for oral or IV bisphosphonates. -Her next best  option will  be  sq denosumab (Prolia).  -She agrees with this plan, will send her to lab to rule out secondary causes such as thyroid/parathyroid dysfunctions, as well as to update her vitamin D and calcium levels.   - we reviewed her dietary and supplemental calcium and vitamin D intake. -She is advised to avoid contact sports, vigorous dancing, flipping, and wrestling to minimize her risk of fractures.  She is however encouraged to adopt aerobic/strength exercise for long walking, swimming, as well as light graduated weightbearing. -Request for Prolia will be sent today so she can get it during her next visit in 10 days.  -She does not have diabetes/prediabetes, A1c at point of care was 5.3%. -Her next DEXA scan will be in May 2021.  - I advised patient to maintain close follow up with Benita Stabile, MD for primary care needs.  - Time spent with the patient: 45 minutes, of which >50% was spent in obtaining information about her symptoms, reviewing  her previous labs, evaluations, and treatments, counseling her about her osteoporosis, and developing a plan to confirm the diagnosis and long term treatment as necessary.  Kathleen Hammond participated in the discussions, expressed understanding, and voiced agreement with the above plans.  All questions were answered to her satisfaction. she is encouraged to contact clinic should she have any questions or concerns prior to her return visit.  Follow up plan: Return in about 10 days (around 09/07/2019), or a1c today, office, will do labs this week, she will likely need prolia next visit, for Follow up with Pre-visit Labs.   Kathleen Lloyd, MD [facility] [address] [place], [place] [address] Phone: [phone]  Fax: [phone]     08/28/2019, 1:13 PM  This note was partially dictated with voice recognition software. Similar sounding words can be transcribed inadequately or may not  be corrected upon review.

## 2019-09-05 ENCOUNTER — Telehealth: Payer: Self-pay | Admitting: "Endocrinology

## 2019-09-05 NOTE — Telephone Encounter (Signed)
Pt contacted [employer] and she does need a Prior Authorization for her Proila Injection. [phone]

## 2019-09-05 NOTE — Telephone Encounter (Signed)
Pt would like to know if the proila injection is going to be covered by her insurance. She said when she was at Dr Juanetta Gosling office and had to have it once it was not covered. Please advise. If she needs to call insurance, let me know I can call her back

## 2019-09-06 NOTE — Telephone Encounter (Signed)
Tried to contact [facility] but lost contact. Will try again tomorrow.

## 2019-09-06 NOTE — Telephone Encounter (Signed)
Pt would like you to call her [phone]

## 2019-09-06 NOTE — Telephone Encounter (Signed)
Spoke with patient earlier and explained to her we did not need the PA since we had prolia in the office. She still wants you to call [employer] to double check

## 2019-09-07 ENCOUNTER — Ambulatory Visit: Payer: Medicare PPO | Admitting: "Endocrinology

## 2019-09-08 NOTE — Telephone Encounter (Signed)
Called [employer] to start the prior authorization process. They are supposed to fax over needed forms.

## 2019-09-18 NOTE — Telephone Encounter (Signed)
[name], Patient wants you to call her to check on status of proila

## 2019-09-18 NOTE — Telephone Encounter (Signed)
Discussed with pt that we are still waiting prior authorization for prolia. Pt states she has dental procedures in the near future and wants to reschedule visit and prolia injection. Pt stated she would contact our office after her dental procedures to reschedule appointment.

## 2019-09-20 ENCOUNTER — Ambulatory Visit: Payer: Medicare PPO | Admitting: "Endocrinology

## 2019-12-07 ENCOUNTER — Encounter: Payer: Self-pay | Admitting: Pulmonary Disease

## 2019-12-07 ENCOUNTER — Telehealth: Payer: Self-pay | Admitting: Pulmonary Disease

## 2019-12-07 NOTE — Telephone Encounter (Signed)
Called and spoke with pt who stated she has a sinus infection and due to this, she is requesting a zpak to be called in to the pharmacy. Pt stated she went to the dentist and they did an xray which showed that her sinuses were clogged. Pt stated that she does have an appt with Dr. Pollyann Kennedy at the end of April 2021 to have an evaluation of sinuses but to hold her over, she wanted to see if we could send an Rx.  Pt stated that symptoms began 3 days ago and stated she had a pred rx which she began 2 days ago. Pt stated she has some pain in face, ears hurt and teeth also hurt. Pt states that she is stuffy and has postnasal drainage but is unable to get any mucus up when she blows her nose. Pt states that her throat is also sore.  Pt denies any complaints of fever and stated when she went to the dentist, they told her that the reading was fine but they did not tell her what her temp was but she wasn't running one.  Pt denies any complaints of cough. Pt stated that she has had to use her rescue inhaler Monday and Tuesday about twice which stated did help with her symptoms.  Arlys John, please advise if you are okay with [place] sending zpak in for pt to help with her sinus infection.

## 2019-12-07 NOTE — Telephone Encounter (Signed)
12/07/2019  Difficult to fully say based off of the lack of the imaging.  An x-ray is not a great way to evaluate sinuses.  Likely Dr. Lucky Rathke office may favor completing a CT.  I would recommend that she be evaluated by ear nose and throat first before antibiotic therapy given the fact that they would likely want to evaluate as well as obtain imaging.  Given the fact that symptoms started less than 10 days ago this is likely not a bacterial process.  She likely has a flare of allergies that are worsening her congestion.  I would recommend:  Continuing Singulair Starting a daily allergy pill Using Flonase 1 spray each nostril And using nasal saline rinses prior to the Flonase  If patient feels that her symptoms are too severe to last till her scheduled appointment with ear nose and throat then she can either contact ear nose and throat and see if she can be seen sooner.  Or she can present to primary care or our office for an evaluation of her symptoms before antibiotic therapy.  Elisha Headland, FNP

## 2019-12-07 NOTE — Telephone Encounter (Signed)
Called and spoke with pt letting her know the info stated by Brian and she verbalized understanding. Nothing further needed. 

## 2019-12-08 ENCOUNTER — Telehealth: Payer: Self-pay | Admitting: Pulmonary Disease

## 2019-12-08 ENCOUNTER — Telehealth (INDEPENDENT_AMBULATORY_CARE_PROVIDER_SITE_OTHER): Payer: Medicare PPO | Admitting: Adult Health

## 2019-12-08 ENCOUNTER — Encounter: Payer: Self-pay | Admitting: Adult Health

## 2019-12-08 DIAGNOSIS — E8801 Alpha-1-antitrypsin deficiency: Secondary | ICD-10-CM

## 2019-12-08 DIAGNOSIS — J019 Acute sinusitis, unspecified: Secondary | ICD-10-CM

## 2019-12-08 MED ORDER — AMOXICILLIN-POT CLAVULANATE 875-125 MG PO TABS: 1.0000 | ORAL_TABLET | Freq: Two times a day (BID) | ORAL | 0 refills | Status: AC

## 2019-12-08 MED ORDER — PREDNISONE 10 MG PO TABS: ORAL_TABLET | ORAL | 0 refills | Status: DC

## 2019-12-08 NOTE — Patient Instructions (Signed)
Augmentin 875 mg twice daily for 1 week take with food Prednisone taper over the next week Mucinex DM twice daily as needed for cough congestion Saline nasal rinses as needed Follow-up with ENT as planned Follow-up in 2 months with PFTs as planned Please contact office for sooner follow up if symptoms do not improve or worsen or seek emergency care

## 2019-12-08 NOTE — Telephone Encounter (Signed)
Spoke with pt. Feels like she is having a flare up of her asthma. Pt has been scheduled for a video visit with Tammy today at 1415. Nothing further was needed.

## 2019-12-08 NOTE — Progress Notes (Signed)
Virtual Visit via Video Note  I connected with Kathleen Hammond on 12/08/19 at  2:15 PM EDT by a video enabled telemedicine application and verified that I am speaking with the correct person using two identifiers.  Location: Patient: Home  Provider:  Office    I discussed the limitations of evaluation and management by telemedicine and the availability of in person appointments. The patient expressed understanding and agreed to proceed.  History of Present Illness: 64 year old female never smoker followed for mild asthma.  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  Patient complains over the last 4 to 5 days that she has had nasal congestion sinus pain pressure productive cough intermittent wheezing.  She has started some leftover prednisone 40 mg for the last 2 days.  She denies any hemoptysis chest pain orthopnea PND or increased leg swelling. Patient has not used any over-the-counter treatments.  She does have a appointment with ENT next week.  She says she has chronic allergies and chronic sinus disease.  Patient Active Problem List   Diagnosis Date Noted  . Osteoporosis 08/28/2019  . Mixed hyperlipidemia 03/16/2011  . Sinus tachycardia 03/16/2011   Current Outpatient Medications on File Prior to Visit  Medication Sig Dispense Refill  . albuterol (PROVENTIL HFA;VENTOLIN HFA) 108 (90 BASE) MCG/ACT inhaler Inhale 2 puffs into the lungs as needed (for asthma).     Marland Kitchen atorvastatin (LIPITOR) 10 MG tablet Take 10 mg by mouth daily.    . ergocalciferol (VITAMIN D2) 1.25 MG (50000 UT) capsule Take 50,000 Units by mouth once a week.     Marland Kitchen SINGULAIR 10 MG tablet Take 1 tablet by mouth at bedtime.     Marland Kitchen PANTOPRAZOLE SODIUM PO Take 40 mg by mouth daily.     No current facility-administered medications on file prior to visit.       Observations/Objective: O2 sats 94-98%   Pets: No pets Occupation: Retired school principal Exposures: No known  exposures.  No mold, hot tub, Jacuzzi Smoking history: Smoker Travel history: Significant travel history Relevant family history: No significant family history of lung disease   Chest x-ray 04/06/2012-no acute cardiopulmonary abnormality CT abdomen pelvis 05/22/13-visualized lung bases are normal. I have reviewed the images personally.  PFTs: 08/03/2018 FVC 3.29 [95%], FEV1 2.45 [92%], F/F 75, TLC 5.16 [96%], DLCO 14.58 [54%) No obstruction or restriction.  Moderate diffusion impairment.   Alpha-25 May 2019 MZ, 70  Assessment and Plan: Acute URI/sinusitis with mild asthma flare  Alpha-1 antitrypsin-MZ phenotype Patient does have a low level at 70.  She has PFTs coming up.. She is high functioning.  Plan  Patient Instructions  Augmentin 875 mg twice daily for 1 week take with food Prednisone taper over the next week Mucinex DM twice daily as needed for cough congestion Saline nasal rinses as needed Follow-up with ENT as planned Follow-up in 2 months with PFTs as planned Please contact office for sooner follow up if symptoms do not improve or worsen or seek emergency care       Follow Up Instructions:   Follow-up in 2 months with PFTs and as needed  I discussed the assessment and treatment plan with the patient. The patient was provided an opportunity to ask questions and all were answered. The patient agreed with the plan and demonstrated an understanding of the instructions.   The patient was advised to call back or seek an in-person evaluation if the symptoms worsen or if the condition  fails to improve as anticipated.  I provided 22 minutes of non-face-to-face time during this encounter.   Rexene Edison, NP

## 2019-12-19 DIAGNOSIS — E8801 Alpha-1-antitrypsin deficiency: Secondary | ICD-10-CM | POA: Diagnosis not present

## 2019-12-19 DIAGNOSIS — K219 Gastro-esophageal reflux disease without esophagitis: Secondary | ICD-10-CM | POA: Diagnosis not present

## 2020-01-04 DIAGNOSIS — N3011 Interstitial cystitis (chronic) with hematuria: Secondary | ICD-10-CM | POA: Diagnosis not present

## 2020-01-04 DIAGNOSIS — N218 Other lower urinary tract calculus: Secondary | ICD-10-CM | POA: Diagnosis not present

## 2020-01-11 NOTE — Telephone Encounter (Signed)
Tammy please advise. Thanks. 

## 2020-01-11 NOTE — Telephone Encounter (Signed)
Those are all great questions , you are only safe if all the ladies you are with are vaccinated. Encourage them to get vaccine then you can feel more comfortable.  You can feel comfortable in setting with all vaccinated but if not vaccinated then need to wear mask and social distance as you are still at some risk.  Hope that helps

## 2020-01-17 DIAGNOSIS — M8588 Other specified disorders of bone density and structure, other site: Secondary | ICD-10-CM | POA: Diagnosis not present

## 2020-01-17 DIAGNOSIS — N958 Other specified menopausal and perimenopausal disorders: Secondary | ICD-10-CM | POA: Diagnosis not present

## 2020-01-17 DIAGNOSIS — Z6828 Body mass index (BMI) 28.0-28.9, adult: Secondary | ICD-10-CM | POA: Diagnosis not present

## 2020-01-17 DIAGNOSIS — Z01419 Encounter for gynecological examination (general) (routine) without abnormal findings: Secondary | ICD-10-CM | POA: Diagnosis not present

## 2020-01-23 ENCOUNTER — Other Ambulatory Visit (HOSPITAL_COMMUNITY): Payer: Medicare Other

## 2020-01-23 DIAGNOSIS — Z1329 Encounter for screening for other suspected endocrine disorder: Secondary | ICD-10-CM | POA: Diagnosis not present

## 2020-01-23 DIAGNOSIS — Z13228 Encounter for screening for other metabolic disorders: Secondary | ICD-10-CM | POA: Diagnosis not present

## 2020-01-23 DIAGNOSIS — Z1321 Encounter for screening for nutritional disorder: Secondary | ICD-10-CM | POA: Diagnosis not present

## 2020-01-23 DIAGNOSIS — Z13220 Encounter for screening for lipoid disorders: Secondary | ICD-10-CM | POA: Diagnosis not present

## 2020-01-23 DIAGNOSIS — Z130 Encounter for screening for diseases of the blood and blood-forming organs and certain disorders involving the immune mechanism: Secondary | ICD-10-CM | POA: Diagnosis not present

## 2020-01-25 ENCOUNTER — Ambulatory Visit: Payer: Medicare PPO | Admitting: Adult Health

## 2020-01-30 DIAGNOSIS — R109 Unspecified abdominal pain: Secondary | ICD-10-CM | POA: Diagnosis not present

## 2020-01-30 DIAGNOSIS — K582 Mixed irritable bowel syndrome: Secondary | ICD-10-CM | POA: Diagnosis not present

## 2020-01-30 DIAGNOSIS — M810 Age-related osteoporosis without current pathological fracture: Secondary | ICD-10-CM | POA: Diagnosis not present

## 2020-01-30 DIAGNOSIS — E559 Vitamin D deficiency, unspecified: Secondary | ICD-10-CM | POA: Diagnosis not present

## 2020-01-30 DIAGNOSIS — E782 Mixed hyperlipidemia: Secondary | ICD-10-CM | POA: Diagnosis not present

## 2020-01-30 DIAGNOSIS — K602 Anal fissure, unspecified: Secondary | ICD-10-CM | POA: Diagnosis not present

## 2020-01-30 DIAGNOSIS — K219 Gastro-esophageal reflux disease without esophagitis: Secondary | ICD-10-CM | POA: Diagnosis not present

## 2020-01-30 DIAGNOSIS — E8801 Alpha-1-antitrypsin deficiency: Secondary | ICD-10-CM | POA: Diagnosis not present

## 2020-02-09 ENCOUNTER — Other Ambulatory Visit: Payer: Self-pay

## 2020-02-09 ENCOUNTER — Ambulatory Visit
Admission: EM | Admit: 2020-02-09 | Discharge: 2020-02-09 | Disposition: A | Discharge status: Home / Self Care | Payer: Medicare PPO | Attending: Emergency Medicine | Admitting: Emergency Medicine

## 2020-02-09 ENCOUNTER — Encounter: Payer: Self-pay | Admitting: Emergency Medicine

## 2020-02-09 DIAGNOSIS — W57XXXA Bitten or stung by nonvenomous insect and other nonvenomous arthropods, initial encounter: Secondary | ICD-10-CM | POA: Diagnosis not present

## 2020-02-09 DIAGNOSIS — L539 Erythematous condition, unspecified: Secondary | ICD-10-CM | POA: Diagnosis not present

## 2020-02-09 MED ORDER — DOXYCYCLINE HYCLATE 100 MG PO CAPS: 200.0000 mg | ORAL_CAPSULE | Freq: Once | ORAL | 0 refills | Status: AC

## 2020-02-09 NOTE — ED Provider Notes (Addendum)
[facility]    CSN: [id number] Arrival date & time: 02/09/20  1553      History   Chief Complaint Chief Complaint  Patient presents with  . Insect Bite    HPI Kathleen Hammond is a 64 y.o. female.   Who presented to the urgent care for complaint of tick bite to right forearm for the past few hours.  Denies a precipitating event, noticed while driving.  Localizes the bite to her right forearm.  Has removed the tick.  Denies previous hx of tick bite.  Denies fever, chills, nausea, vomiting, headache, dizziness, weakness, fatigue, rash, or abdominal pain.   The history is provided by the patient. No language interpreter was used.    Past Medical History:  Diagnosis Date  . Alpha-1-antitrypsin deficiency ([place])   . Asthma   . History of anemia   . Perimenopausal vasomotor symptoms   . Postmenopausal   . Urinary incontinence     Patient Active Problem List   Diagnosis Date Noted  . Osteoporosis 08/28/2019  . Mixed hyperlipidemia 03/16/2011  . Sinus tachycardia 03/16/2011    Past Surgical History:  Procedure Laterality Date  . BREAST EXCISIONAL BIOPSY Right 1996  . CESAREAN SECTION    . CHOLECYSTECTOMY    . cyst removed from the gum    . DILATION AND CURETTAGE OF UTERUS    . GALLBLADDER SURGERY    . lump removed from the breast    . TUBAL LIGATION      OB History   No obstetric history on file.      Home Medications    Prior to Admission medications   Medication Sig Start Date End Date Taking? Authorizing Provider  albuterol (PROVENTIL HFA;VENTOLIN HFA) 108 (90 BASE) MCG/ACT inhaler Inhale 2 puffs into the lungs as needed (for asthma).     [provider]  atorvastatin (LIPITOR) 10 MG tablet Take 10 mg by mouth daily.    [provider]  doxycycline (VIBRAMYCIN) 100 MG capsule Take 2 capsules (200 mg total) by mouth once for 1 dose. 02/09/20 02/09/20  Jontrell Bushong, Darrelyn Hillock, FNP  ergocalciferol (VITAMIN D2) 1.25 MG (50000 UT) capsule Take  50,000 Units by mouth once a week.     [provider]  PANTOPRAZOLE SODIUM PO Take 40 mg by mouth daily. 04/05/19   [provider]  predniSONE (DELTASONE) 10 MG tablet 4 tabs for 2 days, then 3 tabs for 2 days, 2 tabs for 2 days, then 1 tab for 2 days, then stop 12/08/19   Parrett, Tammy S, NP  SINGULAIR 10 MG tablet Take 1 tablet by mouth at bedtime.  02/25/11   [provider]    Family History Family History  Problem Relation Age of Onset  . Heart attack Father 48       massive heart attack  . Hypertension Father   . Cancer Mother        sinus   . Anemia Mother   . Hypertension Other   . Heart disease Other   . Stroke Other     Social History Social History   Tobacco Use  . Smoking status: Never Smoker  . Smokeless tobacco: Never Used  Substance Use Topics  . Alcohol use: Yes    Comment: occasional  . Drug use: No     Allergies   Cefuroxime axetil, Iodine, Avelox [moxifloxacin hcl in nacl], Macrodantin [nitrofurantoin macrocrystal], Bactrim, and Ceclor [cefaclor]   Review of Systems Review of Systems  Constitutional: Negative.   Respiratory: Negative.   Cardiovascular: Negative.   Skin: Positive for color change.  All other systems reviewed and are negative.    Physical Exam Triage Vital Signs ED Triage Vitals  Enc Vitals Group     BP 02/09/20 1605 115/81     Pulse Rate 02/09/20 1605 90     Resp 02/09/20 1605 16     Temp 02/09/20 1605 98.5 F (36.9 C)     Temp Source 02/09/20 1605 Oral     SpO2 02/09/20 1605 98 %     Weight 02/09/20 1611 171 lb 15.3 oz (78 kg)     Height 02/09/20 1610 5\' 5"  (1.651 m)     Head Circumference --      Peak Flow --      Pain Score 02/09/20 1610 0     Pain Loc --      Pain Edu? --      Excl. in GC? --    No data found.  Updated Vital Signs BP 115/81 (BP Location: Right Arm)   Pulse 90   Temp 98.5 F (36.9 C) (Oral)   Resp 16   Ht 5\' 5"  (1.651 m)   Wt 171 lb 15.3 oz (78 kg)   SpO2 98%    BMI 28.62 kg/m   Visual Acuity Right Eye Distance:   Left Eye Distance:   Bilateral Distance:    Right Eye Near:   Left Eye Near:    Bilateral Near:     Physical Exam Vitals and nursing note reviewed.  Constitutional:      General: She is not in acute distress.    Appearance: Normal appearance. She is normal weight. She is not ill-appearing, toxic-appearing or diaphoretic.  Cardiovascular:     Rate and Rhythm: Normal rate and regular rhythm.     Pulses: Normal pulses.     Heart sounds: Normal heart sounds. No murmur heard.  No friction rub. No gallop.   Pulmonary:     Effort: Pulmonary effort is normal. No respiratory distress.     Breath sounds: Normal breath sounds. No stridor. No wheezing, rhonchi or rales.  Chest:     Chest wall: No tenderness.  Skin:    General: Skin is warm.     Capillary Refill: Capillary refill takes less than 2 seconds.     Coloration: Skin is not ashen or jaundiced.     Findings: Erythema present. No rash. Rash is not crusting, macular or scaling.  Neurological:     Mental Status: She is alert.      UC Treatments / Results  Labs (all labs ordered are listed, but only abnormal results are displayed) Labs Reviewed - No data to display  EKG   Radiology No results found.  Procedures Procedures (including critical care time)  Medications Ordered in UC Medications - No data to display  Initial Impression / Assessment and Plan / UC Course  I have reviewed the triage vital signs and the nursing notes.  Pertinent labs & imaging results that were available during my care of the patient were reviewed by me and considered in my medical decision making (see chart for details).   Patient is stable at discharge.  Will prescribe single dose doxycycline.  Was advised to follow PCP or to return for worsening symptoms  Final Clinical Impressions(s) / UC Diagnoses   Final diagnoses:  Tick bite, initial encounter  Erythema     Discharge  Instructions  Prescribed single prophylactic dose of doxycycline 200 mg To prevent tick bites, wear long sleeves, long pants, and light colors. Use DEET insect repellent. Follow the instructions on the bottle. If the tick is biting, do not try to remove it with heat, alcohol, petroleum jelly, or fingernail polish. Use tweezers, curved forceps, or a tick-removal tool to grasp the tick. Gently pull up until the tick lets go. Do not twist or jerk the tick. Do not squeeze or crush the tick. Return here or go to ER if you have any new or worsening symptoms (rash, nausea, vomiting, fever, chills, headache, fatigue)     ED Prescriptions    Medication Sig Dispense Auth. Provider   doxycycline (VIBRAMYCIN) 100 MG capsule Take 2 capsules (200 mg total) by mouth once for 1 dose. 2 capsule Dynisha Due, Zachery Dakins, FNP     PDMP not reviewed this encounter.   Note: This document was prepared using Dragon voice recognition software and may include unintentional dictation errors.       Durward Parcel, FNP 02/09/20 1625

## 2020-02-09 NOTE — Discharge Instructions (Addendum)
Prescribed single prophylactic dose of doxycycline 200 mg To prevent tick bites, wear long sleeves, long pants, and light colors. Use DEET  insect repellent. Follow the instructions on the bottle. If the tick is biting, do not try to remove it with heat, alcohol, petroleum jelly, or fingernail polish. Use tweezers, curved forceps, or a tick-removal tool to grasp the tick. Gently pull up until the tick lets go. Do not twist or jerk the tick. Do not squeeze or crush the tick. Return here or go to ER if you have any new or worsening symptoms (rash, nausea, vomiting, fever, chills, headache, fatigue 

## 2020-02-09 NOTE — ED Triage Notes (Signed)
Pt has bite to RT forearm that she believes is a tick bite

## 2020-02-10 DIAGNOSIS — W57XXXA Bitten or stung by nonvenomous insect and other nonvenomous arthropods, initial encounter: Secondary | ICD-10-CM | POA: Diagnosis not present

## 2020-02-10 DIAGNOSIS — R21 Rash and other nonspecific skin eruption: Secondary | ICD-10-CM | POA: Diagnosis not present

## 2020-02-27 ENCOUNTER — Other Ambulatory Visit (HOSPITAL_COMMUNITY): Payer: Medicare PPO

## 2020-02-29 ENCOUNTER — Ambulatory Visit: Payer: Medicare PPO | Admitting: Adult Health

## 2020-02-29 NOTE — Telephone Encounter (Signed)
Tammy, Please see patient's mychart comment.  She has been advised to call and reschedule when she can do so.

## 2020-03-05 ENCOUNTER — Ambulatory Visit (INDEPENDENT_AMBULATORY_CARE_PROVIDER_SITE_OTHER): Payer: Medicare PPO | Admitting: Ophthalmology

## 2020-03-05 ENCOUNTER — Other Ambulatory Visit: Payer: Self-pay

## 2020-03-05 ENCOUNTER — Encounter (INDEPENDENT_AMBULATORY_CARE_PROVIDER_SITE_OTHER): Payer: Self-pay | Admitting: Ophthalmology

## 2020-03-05 DIAGNOSIS — H33002 Unspecified retinal detachment with retinal break, left eye: Secondary | ICD-10-CM | POA: Insufficient documentation

## 2020-03-05 DIAGNOSIS — Z961 Presence of intraocular lens: Secondary | ICD-10-CM | POA: Diagnosis not present

## 2020-03-05 DIAGNOSIS — H353131 Nonexudative age-related macular degeneration, bilateral, early dry stage: Secondary | ICD-10-CM | POA: Diagnosis not present

## 2020-03-05 DIAGNOSIS — H338 Other retinal detachments: Secondary | ICD-10-CM | POA: Diagnosis not present

## 2020-03-05 DIAGNOSIS — H5052 Exophoria: Secondary | ICD-10-CM | POA: Diagnosis not present

## 2020-03-05 DIAGNOSIS — H33012 Retinal detachment with single break, left eye: Secondary | ICD-10-CM | POA: Diagnosis not present

## 2020-03-05 DIAGNOSIS — H10413 Chronic giant papillary conjunctivitis, bilateral: Secondary | ICD-10-CM | POA: Diagnosis not present

## 2020-03-05 MED ORDER — TOBRAMYCIN 0.3 % OP SOLN: 1.0000 [drp] | Freq: Four times a day (QID) | OPHTHALMIC | 0 refills | Status: AC

## 2020-03-05 MED ORDER — PREDNISOLONE ACETATE 1 % OP SUSP: 1.0000 [drp] | Freq: Four times a day (QID) | OPHTHALMIC | 0 refills | Status: DC

## 2020-03-05 NOTE — Assessment & Plan Note (Signed)
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 My experience this is a less than ideal candidate for image in the office pneumatic retinopexy. High risk for secondary retinal tears developing  from the injection of intravitreal gas is less than optimal.  We will plan surgical care via vitrectomy, posterior vitreous detachment removal laser retinopexy, retinal cryopexy and injection intravitreal gas left eye

## 2020-03-05 NOTE — Progress Notes (Signed)
03/05/2020     CHIEF COMPLAINT Patient presents for Retina Evaluation   HISTORY OF PRESENT ILLNESS: Kathleen Hammond is a 64 y.o. female who presents to the clinic today for:   HPI    Retina Evaluation    In left eye.  Duration of 3 days.  Associated Symptoms Flashes and Floaters.  Context:  distance vision.  Treatments tried include no treatments.          Comments    Referred by [facility] for possible [place] RD OS-  Patient states Saturday that she noticed a lot of floaters OS. Patient states that a circle appeared in her field of vision OS last night and noticed FOL this morning.        Last edited by Gerda Diss on 03/05/2020  2:56 PM. (History)      Referring physician: Warden Fillers, MD [address] [place],  [place] [address]  HISTORICAL INFORMATION:   Selected notes from the MEDICAL RECORD NUMBER    Lab Results  Component Value Date   HGBA1C 5.3 08/28/2019     CURRENT MEDICATIONS: Current Outpatient Medications (Ophthalmic Drugs)  Medication Sig  . prednisoLONE acetate (PRED FORTE) 1 % ophthalmic suspension Place 1 drop into the left eye 4 (four) times daily.  Marland Kitchen tobramycin (TOBREX) 0.3 % ophthalmic solution Place 1 drop into the left eye in the morning, at noon, in the evening, and at bedtime for 10 days.   No current facility-administered medications for this visit. (Ophthalmic Drugs)   Current Outpatient Medications (Other)  Medication Sig  . albuterol (PROVENTIL HFA;VENTOLIN HFA) 108 (90 BASE) MCG/ACT inhaler Inhale 2 puffs into the lungs as needed (for asthma).   Marland Kitchen atorvastatin (LIPITOR) 10 MG tablet Take 10 mg by mouth daily.  . ergocalciferol (VITAMIN D2) 1.25 MG (50000 UT) capsule Take 50,000 Units by mouth once a week.   Marland Kitchen PANTOPRAZOLE SODIUM PO Take 40 mg by mouth daily.  . predniSONE (DELTASONE) 10 MG tablet 4 tabs for 2 days, then 3 tabs for 2 days, 2 tabs for 2 days, then 1 tab for 2 days, then stop  . SINGULAIR 10 MG tablet Take 1  tablet by mouth at bedtime.    No current facility-administered medications for this visit. (Other)      REVIEW OF SYSTEMS:    ALLERGIES Allergies  Allergen Reactions  . Cefuroxime Axetil Shortness Of Breath  . Iodine Shortness Of Breath  . Avelox [Moxifloxacin Hcl In Nacl] Other (See Comments)    Hallucinations   . Macrodantin [Nitrofurantoin Macrocrystal] Nausea Only    Chills, aching  . Bactrim Rash    Septra  . Ceclor [Cefaclor] Rash    PAST MEDICAL HISTORY Past Medical History:  Diagnosis Date  . Alpha-1-antitrypsin deficiency ([place])   . Asthma   . History of anemia   . Perimenopausal vasomotor symptoms   . Postmenopausal   . Urinary incontinence    Past Surgical History:  Procedure Laterality Date  . BREAST EXCISIONAL BIOPSY Right 1996  . CATARACT EXTRACTION Right 05/2014   [facility]  . CATARACT EXTRACTION Left 05/2014   [facility]  . CESAREAN SECTION    . CHOLECYSTECTOMY    . cyst removed from the gum    . DILATION AND CURETTAGE OF UTERUS    . GALLBLADDER SURGERY    . lump removed from the breast    . TUBAL LIGATION    . YAG LASER APPLICATION Left 3612   [facility]  .  YAG LASER APPLICATION Right 5093   [facility]    FAMILY HISTORY Family History  Problem Relation Age of Onset  . Heart attack Father 61       massive heart attack  . Hypertension Father   . Cancer Mother        sinus   . Anemia Mother   . Hypertension Other   . Heart disease Other   . Stroke Other     SOCIAL HISTORY Social History   Tobacco Use  . Smoking status: Never Smoker  . Smokeless tobacco: Never Used  Substance Use Topics  . Alcohol use: Yes    Comment: occasional  . Drug use: No         OPHTHALMIC EXAM:  Base Eye Exam    Visual Acuity (Snellen - Linear)      Right Left   Dist [place] 20/20-2 20/25-1       Tonometry (Tonopen, 2:57 PM)      Right Left   Pressure 9 9       Pupils      Pupils Dark Light Shape React APD   Right PERRL 4 3 Round Brisk None   Left PERRL  8 8 Round Minimal None       Visual Fields (Counting fingers)      Left Right    Full Full       Extraocular Movement      Right Left    Full Full       Neuro/Psych    Oriented x3: Yes   Mood/Affect: Normal        Slit Lamp and Fundus Exam    External Exam      Right Left   External Normal Normal       Slit Lamp Exam      Right Left   Lids/Lashes Normal Normal   Conjunctiva/Sclera White and quiet White and quiet   Cornea Clear Clear   Anterior Chamber Deep and quiet Deep and quiet   Iris Round and reactive Round and reactive   Lens Centered posterior chamber intraocular lens Centered posterior chamber intraocular lens   Anterior Vitreous Normal Normal       Fundus Exam      Right Left   C/D Ratio 0.45 0.45   Macula Hard drusen, no hemorrhage, no macular thickening    Vessels Normal    Periphery Normal Rhegmatogenous retinal detachment,, inferiorly from 1030 to 12:00 meridian, no visible retinal hole or tear with scleral depression in this region, 3 m contact lens wear 90 diopter examination.  The retina however is semiopaque, mobile in a rhegmatogenous fashion.          IMAGING AND PROCEDURES  Imaging and Procedures for 03/05/20  Color Fundus Photography Optos - OU - Both Eyes       Right Eye Progression has no prior data. Disc findings include normal observations. Macula : drusen. Vessels : normal observations. Periphery : normal observations.   Left Eye Progression has no prior data. Disc findings include normal observations. Macula : drusen. Vessels : normal observations. Periphery : detachment.        B-Scan Ultrasound - OS - Left Eye       Quality was good. Findings included vitreous opacities.   Notes Dermatology retinal detachment 1030 to 12:00 meridian slightly posterior to the equator in the left eye, no retinal masses.  There is significant vitreal retinal attachment persisting. Thus this in my opinion is not a good  candidate for  pneumatic retinopexy in the office setting because of the risk of contralateral, secondary retinal break and detachment extension.                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 My experience this is a less than ideal candidate for image in the office pneumatic retinopexy. High risk for secondary retinal tears developing  from the injection of intravitreal gas is less than optimal.  We will plan surgical care via vitrectomy, posterior vitreous detachment removal laser retinopexy, retinal cryopexy and injection intravitreal gas left eye      ICD-10-CM   1. 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 We will plan surgical repair via vitrectomy, endolaser, retinal cryopexy and injection intravitreal gas left eye [facility] under local MAC  2.  3.  Ophthalmic Meds Ordered this visit:  Meds ordered this encounter  Medications  . prednisoLONE acetate (PRED FORTE) 1 % ophthalmic suspension    Sig: Place 1 drop into the left eye 4 (four) times daily.    Dispense:  10 mL    Refill:  0  . tobramycin (TOBREX) 0.3 % ophthalmic solution    Sig: Place 1 drop into the left eye in the morning, at noon, in the evening, and at bedtime for 10 days.    Dispense:  5 mL    Refill:  0       Return ,,,, [facility], local MAC left eye, for Schedule surgical repair, vitrectomy, retinal cryopexy and injection intravitreal gas left eye .  There are no Patient Instructions on file for this visit.   Explained the diagnoses, plan, and follow up with the patient and they expressed understanding.  Patient expressed understanding of the importance of  proper follow up care.   Clent Demark Nydia Ytuarte M.D. Diseases & Surgery of the Retina and Vitreous [facility] [place] 03/05/20     Abbreviations: M myopia (nearsighted); A astigmatism; H hyperopia (farsighted); P presbyopia; Mrx spectacle prescription;  CTL contact lenses; OD right eye; OS left eye; OU both eyes  XT exotropia; ET esotropia; PEK punctate epithelial keratitis; PEE punctate epithelial erosions; DES dry eye syndrome; MGD meibomian gland dysfunction; ATs artificial tears; PFAT's preservative free artificial tears; [place] nuclear sclerotic cataract; PSC posterior subcapsular cataract; ERM epi-retinal membrane; PVD posterior vitreous detachment; RD retinal detachment; DM diabetes mellitus; DR diabetic retinopathy; NPDR non-proliferative diabetic retinopathy; PDR proliferative diabetic retinopathy; CSME clinically significant macular edema; DME diabetic macular edema; dbh dot blot hemorrhages; CWS cotton wool spot; POAG primary open angle glaucoma; C/D cup-to-disc ratio; HVF humphrey visual field; GVF goldmann visual field; OCT optical coherence tomography; IOP intraocular pressure; BRVO Branch retinal vein occlusion; CRVO central retinal vein occlusion; CRAO central retinal artery occlusion; BRAO branch retinal artery occlusion; RT retinal tear; SB scleral buckle; PPV pars plana vitrectomy; VH Vitreous hemorrhage; PRP panretinal laser photocoagulation; IVK intravitreal kenalog; VMT vitreomacular traction; MH Macular hole;  NVD neovascularization of the disc; NVE neovascularization elsewhere; AREDS age related eye disease study; ARMD age related macular degeneration; POAG primary open angle glaucoma; EBMD epithelial/anterior basement membrane dystrophy; ACIOL anterior chamber intraocular lens; IOL intraocular lens; PCIOL posterior chamber intraocular  lens; Phaco/IOL phacoemulsification with intraocular lens placement; [place] photorefractive keratectomy; LASIK laser assisted in situ keratomileusis;  HTN hypertension; DM diabetes mellitus; COPD chronic obstructive pulmonary disease

## 2020-03-06 ENCOUNTER — Encounter (INDEPENDENT_AMBULATORY_CARE_PROVIDER_SITE_OTHER): Payer: Medicare PPO | Admitting: Ophthalmology

## 2020-03-06 DIAGNOSIS — H33012 Retinal detachment with single break, left eye: Secondary | ICD-10-CM | POA: Diagnosis not present

## 2020-03-07 ENCOUNTER — Ambulatory Visit (INDEPENDENT_AMBULATORY_CARE_PROVIDER_SITE_OTHER): Payer: Medicare PPO | Admitting: Ophthalmology

## 2020-03-07 ENCOUNTER — Encounter (INDEPENDENT_AMBULATORY_CARE_PROVIDER_SITE_OTHER): Payer: Self-pay | Admitting: Ophthalmology

## 2020-03-07 ENCOUNTER — Other Ambulatory Visit: Payer: Self-pay

## 2020-03-07 ENCOUNTER — Telehealth (INDEPENDENT_AMBULATORY_CARE_PROVIDER_SITE_OTHER): Payer: Self-pay

## 2020-03-07 DIAGNOSIS — H33002 Unspecified retinal detachment with retinal break, left eye: Secondary | ICD-10-CM

## 2020-03-07 MED ORDER — CYCLOPENTOLATE HCL 0.5 % OP SOLN: 1.0000 [drp] | Freq: Three times a day (TID) | OPHTHALMIC | 0 refills | Status: DC

## 2020-03-07 NOTE — Progress Notes (Signed)
03/07/2020     CHIEF COMPLAINT Patient presents for Post-op Follow-up   HISTORY OF PRESENT ILLNESS: Kathleen Hammond is a 64 y.o. female who presents to the clinic today for:   HPI    Post-op Follow-up    In left eye.  Discomfort includes Negative for pain and itching.          Comments    1 day PO OS - Vitrectomy 25G, Cryopexy, Endolaser, Gas inject Patient states she slept well last night. Patient denies any problems.       Last edited by Berenice Bouton on 03/07/2020  9:54 AM. (History)      Referring physician: Benita Stabile, MD [name] [name],  [place] [address]  HISTORICAL INFORMATION:   Selected notes from the MEDICAL RECORD NUMBER    Lab Results  Component Value Date   HGBA1C 5.3 08/28/2019     CURRENT MEDICATIONS: Current Outpatient Medications (Ophthalmic Drugs)  Medication Sig  . cyclopentolate (CYCLOGYL) 0.5 % ophthalmic solution Place 1 drop into the left eye in the morning, at noon, and at bedtime.  . prednisoLONE acetate (PRED FORTE) 1 % ophthalmic suspension Place 1 drop into the left eye 4 (four) times daily.  Marland Kitchen tobramycin (TOBREX) 0.3 % ophthalmic solution Place 1 drop into the left eye in the morning, at noon, in the evening, and at bedtime for 10 days.   No current facility-administered medications for this visit. (Ophthalmic Drugs)   Current Outpatient Medications (Other)  Medication Sig  . albuterol (PROVENTIL HFA;VENTOLIN HFA) 108 (90 BASE) MCG/ACT inhaler Inhale 2 puffs into the lungs as needed (for asthma).   Marland Kitchen atorvastatin (LIPITOR) 10 MG tablet Take 10 mg by mouth daily.  . ergocalciferol (VITAMIN D2) 1.25 MG (50000 UT) capsule Take 50,000 Units by mouth once a week.   Marland Kitchen PANTOPRAZOLE SODIUM PO Take 40 mg by mouth daily.  . predniSONE (DELTASONE) 10 MG tablet 4 tabs for 2 days, then 3 tabs for 2 days, 2 tabs for 2 days, then 1 tab for 2 days, then stop  . SINGULAIR 10 MG tablet Take 1 tablet by mouth at bedtime.    No current  facility-administered medications for this visit. (Other)      REVIEW OF SYSTEMS:    ALLERGIES Allergies  Allergen Reactions  . Cefuroxime Axetil Shortness Of Breath  . Iodine Shortness Of Breath  . Avelox [Moxifloxacin Hcl In Nacl] Other (See Comments)    Hallucinations   . Macrodantin [Nitrofurantoin Macrocrystal] Nausea Only    Chills, aching  . Bactrim Rash    Septra  . Ceclor [Cefaclor] Rash    PAST MEDICAL HISTORY Past Medical History:  Diagnosis Date  . Alpha-1-antitrypsin deficiency (HCC)   . Asthma   . History of anemia   . Perimenopausal vasomotor symptoms   . Postmenopausal   . Urinary incontinence    Past Surgical History:  Procedure Laterality Date  . BREAST EXCISIONAL BIOPSY Right 1996  . CATARACT EXTRACTION Right 05/2014   [facility]  . CATARACT EXTRACTION Left 05/2014   [facility]  . CESAREAN SECTION    . CHOLECYSTECTOMY    . cyst removed from the gum    . DILATION AND CURETTAGE OF UTERUS    . GALLBLADDER SURGERY    . lump removed from the breast    . TUBAL LIGATION    . YAG LASER APPLICATION Left 2016   [facility]  . YAG LASER APPLICATION Right 2016   [facility]  FAMILY HISTORY Family History  Problem Relation Age of Onset  . Heart attack Father 76       massive heart attack  . Hypertension Father   . Cancer Mother        sinus   . Anemia Mother   . Hypertension Other   . Heart disease Other   . Stroke Other     SOCIAL HISTORY Social History   Tobacco Use  . Smoking status: Never Smoker  . Smokeless tobacco: Never Used  Substance Use Topics  . Alcohol use: Yes    Comment: occasional  . Drug use: No         OPHTHALMIC EXAM:  Base Eye Exam    Visual Acuity (Snellen - Linear)      Right Left   Dist [place] 20/20 CF @ 6'   Dist ph [place]  NI       Tonometry (Tonopen, 10:02 AM)      Right Left   Pressure 13 13       Neuro/Psych    Oriented x3: Yes   Mood/Affect: Normal       Dilation    Left eye: 1.0% Mydriacyl, 2.5% Phenylephrine  @ 10:02 AM        Slit Lamp and Fundus Exam    External Exam      Right Left   External Normal Normal       Slit Lamp Exam      Right Left   Lids/Lashes Normal Normal   Conjunctiva/Sclera White and quiet White and quiet   Cornea Clear Clear   Anterior Chamber Deep and quiet Deep and quiet   Iris Round and reactive Round and reactive   Lens Posterior chamber intraocular lens Posterior chamber intraocular lens   Anterior Vitreous Normal Normal       Fundus Exam      Right Left   Posterior Vitreous  Vitrectomized, clear, gas 90%   Disc  Normal   C/D Ratio 0.45 0.45   Macula Hard drusen, no hemorrhage, no macular thickening Hard drusen, no retinal thickening   Vessels Normal Normal   Periphery Normal Retina attached superiorly, good laser around the retinotomy at the equator superonasally.  Good cryo reaction superonasally.          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  The retina nicely attached today.  Positioning reviewed.  I will offer her topical cycloplegia with Cyclogyl because of the discomfort that could occur with the use of cryopexy.  She may use this up to 3 times daily      ICD-10-CM   1. 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  Resume topical tobramycin and Pred forte 1 drop left eye 4 times daily  2.  3.  Ophthalmic Meds Ordered this visit:  Meds ordered this encounter  Medications  . cyclopentolate (CYCLOGYL) 0.5 % ophthalmic solution    Sig: Place 1 drop into the left eye in the morning, at noon, and at bedtime.    Dispense:  5 mL    Refill:  0       Return in about 1 week (around 03/14/2020) for dilate, OS, COLOR FP.  Patient Instructions  The patient was  found to be doing well, postoperatively, and was advised in the use of drops and home care.  Patient was advised not to rub eyes. Positioning was described as well, as precautions regarding intravitreal gas, if applicable. DO NOT TRAVEL TO MOUNTAINS, OR IN AIRPLANE, UNTIL THE BUBBLE INSIDE THE EYE HAS DISAPPEARED.  The use of eye patch at night is optional and was discussed. May use paper tape with patch if patient has dry skin and transpore tape for oily skin.   Use topical medications as ordered.    Explained the diagnoses, plan, and follow up with the patient and they expressed understanding.  Patient expressed understanding of the importance of proper follow up care.   Alford Highland Drayk Humbarger M.D. Diseases & Surgery of the Retina and Vitreous [facility] 03/07/20     Abbreviations: M myopia (nearsighted); A astigmatism; H hyperopia (farsighted); P presbyopia; Mrx spectacle prescription;  CTL contact lenses; OD right eye; OS left eye; OU both eyes  XT exotropia; ET esotropia; PEK punctate epithelial keratitis; PEE punctate epithelial erosions; DES dry eye syndrome; MGD meibomian gland dysfunction; ATs artificial tears; PFAT's preservative free artificial tears; NSC nuclear sclerotic cataract; PSC posterior subcapsular cataract; ERM epi-retinal membrane; PVD posterior vitreous detachment; RD retinal detachment; DM diabetes mellitus; DR diabetic retinopathy; NPDR non-proliferative diabetic retinopathy; PDR proliferative diabetic retinopathy; CSME clinically significant macular edema; DME diabetic macular edema; dbh dot blot hemorrhages; CWS cotton wool spot; POAG primary open angle glaucoma; C/D cup-to-disc ratio; HVF humphrey visual field; GVF goldmann visual field; OCT optical coherence tomography; IOP intraocular pressure; BRVO Branch retinal vein occlusion; CRVO central retinal vein occlusion; CRAO central retinal artery occlusion; BRAO branch retinal artery occlusion; RT retinal tear; SB  scleral buckle; PPV pars plana vitrectomy; VH Vitreous hemorrhage; PRP panretinal laser photocoagulation; IVK intravitreal kenalog; VMT vitreomacular traction; MH Macular hole;  NVD neovascularization of the disc; NVE neovascularization elsewhere; AREDS age related eye disease study; ARMD age related macular degeneration; POAG primary open angle glaucoma; EBMD epithelial/anterior basement membrane dystrophy; ACIOL anterior chamber intraocular lens; IOL intraocular lens; PCIOL posterior chamber intraocular lens; Phaco/IOL phacoemulsification with intraocular lens placement; PRK photorefractive keratectomy; LASIK laser assisted in situ keratomileusis; HTN hypertension; DM diabetes mellitus; COPD chronic obstructive pulmonary disease

## 2020-03-07 NOTE — Telephone Encounter (Signed)
[facility] pharmacy called stating they did not have Cyclopentolate 0.5%. However they had Cyclogyl 1% in a 52mL bottle or they had Atropine, which is covered by the patients insurance. Dr. Luciana Axe decided to switch patient to Atropine - 1 drop in the left eye once daily. This was verbally called into the [facility] on [address] in [place], [place].

## 2020-03-07 NOTE — Patient Instructions (Signed)
The patient was found to be doing well, postoperatively, and was advised in the use of drops and home care.  Patient was advised not to rub eyes. Positioning was described as well, as precautions regarding intravitreal gas, if applicable. DO NOT TRAVEL TO [place], OR IN AIRPLANE, UNTIL THE BUBBLE INSIDE THE EYE HAS DISAPPEARED.  The use of eye patch at night is optional and was discussed. May use paper tape with patch if patient has dry skin and transpore tape for oily skin.   Use topical medications as ordered. 

## 2020-03-07 NOTE — Assessment & Plan Note (Signed)
Postop day #1 status post vitrectomy, retinal cryopexy for atrophic hole superiorly, drainage of subretinal fluid, focal laser, patient SF 6 gas 20%.  The retina nicely attached today.  Positioning reviewed.  I will offer her topical cycloplegia with Cyclogyl because of the discomfort that could occur with the use of cryopexy.  She may use this up to 3 times daily

## 2020-03-14 ENCOUNTER — Other Ambulatory Visit: Payer: Self-pay

## 2020-03-14 ENCOUNTER — Encounter (INDEPENDENT_AMBULATORY_CARE_PROVIDER_SITE_OTHER): Payer: Self-pay | Admitting: Ophthalmology

## 2020-03-14 ENCOUNTER — Ambulatory Visit (INDEPENDENT_AMBULATORY_CARE_PROVIDER_SITE_OTHER): Payer: Medicare PPO | Admitting: Ophthalmology

## 2020-03-14 DIAGNOSIS — H33002 Unspecified retinal detachment with retinal break, left eye: Secondary | ICD-10-CM

## 2020-03-14 NOTE — Patient Instructions (Signed)
The patient was found to be doing well, postoperatively, and was advised in the use of drops and home care.  Patient was advised not to rub eyes. Positioning was described as well, as precautions regarding intravitreal gas, if applicable. DO NOT TRAVEL TO [place], OR IN AIRPLANE, UNTIL THE BUBBLE INSIDE THE EYE HAS DISAPPEARED.  The use of eye patch at night is optional and was discussed. May use paper tape with patch if patient has dry skin and transpore tape for oily skin.   Use topical medications as ordered.  Patient to continue on topical therapy.  Tobramycin 1 drop left eye 4 times daily and prednisolone acetate 1 drop left eye 4 times daily, she can use the topical Cyclogyl, red top , for achy discomfort on an as-needed basis

## 2020-03-14 NOTE — Progress Notes (Signed)
03/14/2020     CHIEF COMPLAINT Patient presents for Post-op Follow-up   HISTORY OF PRESENT ILLNESS: Kathleen Hammond is a 64 y.o. female who presents to the clinic today for:   HPI    Post-op Follow-up    In left eye.  Discomfort includes Negative for pain and itching.  Vision is stable.          Comments    1 week PO OS - Vitrectomy 25G, Cryopexy, Endolaser, Gas inject Pt states it looks as if the gas bubble has dropped down lower in her eye.       Last edited by Berenice Bouton on 03/14/2020  9:17 AM. (History)      Referring physician: Benita Stabile, MD [address] [name],  [place] [address]  HISTORICAL INFORMATION:   Selected notes from the MEDICAL RECORD NUMBER    Lab Results  Component Value Date   HGBA1C 5.3 08/28/2019     CURRENT MEDICATIONS: Current Outpatient Medications (Ophthalmic Drugs)  Medication Sig  . cyclopentolate (CYCLOGYL) 0.5 % ophthalmic solution Place 1 drop into the left eye in the morning, at noon, and at bedtime.  . prednisoLONE acetate (PRED FORTE) 1 % ophthalmic suspension Place 1 drop into the left eye 4 (four) times daily.  Marland Kitchen tobramycin (TOBREX) 0.3 % ophthalmic solution Place 1 drop into the left eye in the morning, at noon, in the evening, and at bedtime for 10 days.   No current facility-administered medications for this visit. (Ophthalmic Drugs)   Current Outpatient Medications (Other)  Medication Sig  . albuterol (PROVENTIL HFA;VENTOLIN HFA) 108 (90 BASE) MCG/ACT inhaler Inhale 2 puffs into the lungs as needed (for asthma).   Marland Kitchen atorvastatin (LIPITOR) 10 MG tablet Take 10 mg by mouth daily.  . ergocalciferol (VITAMIN D2) 1.25 MG (50000 UT) capsule Take 50,000 Units by mouth once a week.   Marland Kitchen PANTOPRAZOLE SODIUM PO Take 40 mg by mouth daily.  . predniSONE (DELTASONE) 10 MG tablet 4 tabs for 2 days, then 3 tabs for 2 days, 2 tabs for 2 days, then 1 tab for 2 days, then stop  . SINGULAIR 10 MG tablet Take 1 tablet by mouth at  bedtime.    No current facility-administered medications for this visit. (Other)      REVIEW OF SYSTEMS:    ALLERGIES Allergies  Allergen Reactions  . Cefuroxime Axetil Shortness Of Breath  . Iodine Shortness Of Breath  . Avelox [Moxifloxacin Hcl In Nacl] Other (See Comments)    Hallucinations   . Macrodantin [Nitrofurantoin Macrocrystal] Nausea Only    Chills, aching  . Bactrim Rash    Septra  . Ceclor [Cefaclor] Rash    PAST MEDICAL HISTORY Past Medical History:  Diagnosis Date  . Alpha-1-antitrypsin deficiency (HCC)   . Asthma   . History of anemia   . Perimenopausal vasomotor symptoms   . Postmenopausal   . Urinary incontinence    Past Surgical History:  Procedure Laterality Date  . BREAST EXCISIONAL BIOPSY Right 1996  . CATARACT EXTRACTION Right 05/2014   [facility]  . CATARACT EXTRACTION Left 05/2014   [facility]  . CESAREAN SECTION    . CHOLECYSTECTOMY    . cyst removed from the gum    . DILATION AND CURETTAGE OF UTERUS    . GALLBLADDER SURGERY    . lump removed from the breast    . TUBAL LIGATION    . YAG LASER APPLICATION Left 2016   [facility]  .  YAG LASER APPLICATION Right 2016   [facility]    FAMILY HISTORY Family History  Problem Relation Age of Onset  . Heart attack Father 72       massive heart attack  . Hypertension Father   . Cancer Mother        sinus   . Anemia Mother   . Hypertension Other   . Heart disease Other   . Stroke Other     SOCIAL HISTORY Social History   Tobacco Use  . Smoking status: Never Smoker  . Smokeless tobacco: Never Used  Substance Use Topics  . Alcohol use: Yes    Comment: occasional  . Drug use: No         OPHTHALMIC EXAM:  Base Eye Exam    Visual Acuity (Snellen - Linear)      Right Left   Dist cc 20/20 20/30+1   Correction: Glasses       Tonometry (Tonopen, 9:20 AM)      Right Left   Pressure 11 13       Neuro/Psych    Oriented x3: Yes   Mood/Affect: Normal       Dilation    Left eye: 1.0%  Mydriacyl, 2.5% Phenylephrine @ 9:20 AM        Slit Lamp and Fundus Exam    External Exam      Right Left   External Normal Normal       Slit Lamp Exam      Right Left   Lids/Lashes Normal Normal   Conjunctiva/Sclera White and quiet White and quiet   Cornea Clear Clear   Anterior Chamber Deep and quiet Deep and quiet   Iris Round and reactive Round and reactive   Lens Posterior chamber intraocular lens Posterior chamber intraocular lens   Vitreous Normal Normal          IMAGING AND PROCEDURES  Imaging and Procedures for 03/14/20           ASSESSMENT/PLAN:  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  Patient to continue on topical therapy.  Tobramycin 1 drop left eye 4 times daily and prednisolone acetate 1 drop left eye 4 times daily, she can use the topical Cyclogyl, red top , for achy discomfort on an as-needed basis      ICD-10-CM   1. 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  OS looks great status post retinal detachment repair.  2.  3.  Ophthalmic Meds Ordered this visit:  No orders of the defined types were placed in this encounter.      Return in about 2 weeks (around 03/28/2020) for OS, COLOR FP, POST OP.  Patient Instructions  The patient was found to be doing well, postoperatively, and was advised in the use of drops and home care.  Patient was advised not to rub eyes. Positioning was described as well, as precautions regarding intravitreal gas, if applicable. DO NOT TRAVEL TO [place], OR IN AIRPLANE, UNTIL THE BUBBLE INSIDE THE EYE HAS DISAPPEARED.  The use of eye patch at night is optional and was discussed. May use paper tape with patch if patient has dry skin and transpore tape for oily skin.   Use topical medications as ordered.  Patient to continue on topical therapy.  Tobramycin 1 drop left eye 4  times daily and prednisolone  acetate 1 drop left eye 4 times daily, she can use the topical Cyclogyl, red top , for achy discomfort on an as-needed basis    Explained the diagnoses, plan, and follow up with the patient and they expressed understanding.  Patient expressed understanding of the importance of proper follow up care.   Alford Highland Edy Mcbane M.D. Diseases & Surgery of the Retina and Vitreous [facility] 03/14/20     Abbreviations: M myopia (nearsighted); A astigmatism; H hyperopia (farsighted); P presbyopia; Mrx spectacle prescription;  CTL contact lenses; OD right eye; OS left eye; OU both eyes  XT exotropia; ET esotropia; PEK punctate epithelial keratitis; PEE punctate epithelial erosions; DES dry eye syndrome; MGD meibomian gland dysfunction; ATs artificial tears; PFAT's preservative free artificial tears; NSC nuclear sclerotic cataract; PSC posterior subcapsular cataract; ERM epi-retinal membrane; PVD posterior vitreous detachment; RD retinal detachment; DM diabetes mellitus; DR diabetic retinopathy; NPDR non-proliferative diabetic retinopathy; PDR proliferative diabetic retinopathy; CSME clinically significant macular edema; DME diabetic macular edema; dbh dot blot hemorrhages; CWS cotton wool spot; POAG primary open angle glaucoma; C/D cup-to-disc ratio; HVF humphrey visual field; GVF goldmann visual field; OCT optical coherence tomography; IOP intraocular pressure; BRVO Branch retinal vein occlusion; CRVO central retinal vein occlusion; CRAO central retinal artery occlusion; BRAO branch retinal artery occlusion; RT retinal tear; SB scleral buckle; PPV pars plana vitrectomy; VH Vitreous hemorrhage; PRP panretinal laser photocoagulation; IVK intravitreal kenalog; VMT vitreomacular traction; MH Macular hole;  NVD neovascularization of the disc; NVE neovascularization elsewhere; AREDS age related eye disease study; ARMD age related macular degeneration; POAG primary open angle  glaucoma; EBMD epithelial/anterior basement membrane dystrophy; ACIOL anterior chamber intraocular lens; IOL intraocular lens; PCIOL posterior chamber intraocular lens; Phaco/IOL phacoemulsification with intraocular lens placement; PRK photorefractive keratectomy; LASIK laser assisted in situ keratomileusis; HTN hypertension; DM diabetes mellitus; COPD chronic obstructive pulmonary disease

## 2020-03-14 NOTE — Assessment & Plan Note (Signed)
OS, 1 week status post repair retinal detachment via vitrectomy, cryo, laser retinopexy and injection of gas.  Patient to continue on topical therapy.  Tobramycin 1 drop left eye 4 times daily and prednisolone acetate 1 drop left eye 4 times daily, she can use the topical Cyclogyl, red top , for achy discomfort on an as-needed basis

## 2020-03-28 ENCOUNTER — Other Ambulatory Visit: Payer: Self-pay

## 2020-03-28 ENCOUNTER — Ambulatory Visit (INDEPENDENT_AMBULATORY_CARE_PROVIDER_SITE_OTHER): Payer: Medicare PPO | Admitting: Ophthalmology

## 2020-03-28 ENCOUNTER — Encounter (INDEPENDENT_AMBULATORY_CARE_PROVIDER_SITE_OTHER): Payer: Self-pay | Admitting: Ophthalmology

## 2020-03-28 DIAGNOSIS — H353132 Nonexudative age-related macular degeneration, bilateral, intermediate dry stage: Secondary | ICD-10-CM | POA: Diagnosis not present

## 2020-03-28 DIAGNOSIS — H43811 Vitreous degeneration, right eye: Secondary | ICD-10-CM

## 2020-03-28 DIAGNOSIS — H33002 Unspecified retinal detachment with retinal break, left eye: Secondary | ICD-10-CM | POA: Diagnosis not present

## 2020-03-28 NOTE — Patient Instructions (Signed)
Patient to notify the office promptly if new symptoms develop or worsen in either eye.

## 2020-03-28 NOTE — Assessment & Plan Note (Signed)
The nature of posterior vitreous detachment was discussed with the patient as well as its physiology, its age prevalence, and its possible implication regarding retinal breaks and detachment.  An informational brochure was given to the patient.  All the patient's questions were answered.  The patient was asked to return if new or different flashes or floaters develops.   Patient was instructed to contact office immediately if any changes were noticed. I explained to the patient that vitreous inside the eye is similar to jello inside a bowl. As the jello melts it can start to pull away from the bowl, similarly the vitreous throughout our lives can begin to pull away from the retina. That process is called a posterior vitreous detachment. In some cases, the vitreous can tug hard enough on the retina to form a retinal tear. I discussed with the patient the signs and symptoms of a retinal detachment.  Do not rub the eye.  No retinal holes or tears on examination today

## 2020-03-28 NOTE — Assessment & Plan Note (Signed)
The nature of age--related macular degeneration was discussed with the patient as well as the distinction between dry and wet types. Checking an Amsler Grid daily with advice to return immediately should a distortion develop, was given to the patient. The patient 's smoking status now and in the past was determined and advice based on the AREDS study was provided regarding the consumption of antioxidant supplements. AREDS 2 vitamin formulation was recommended. Consumption of dark leafy vegetables and fresh fruits of various colors was recommended. Treatment modalities for wet macular degeneration particularly the use of intravitreal injections of anti-blood vessel growth factors was discussed with the patient. Avastin, Lucentis, and Eylea are the available options. On occasion, therapy includes the use of photodynamic therapy and thermal laser. Stressed to the patient do not rub eyes.  Patient was advised to check Amsler Grid daily and return immediately if changes are noted. Instructions on using the grid were given to the patient. All patient questions were answered.   Tolerate PreserVision likely due to the zinc.  I have encouraged him to use lutein supplements once daily

## 2020-03-28 NOTE — Progress Notes (Signed)
03/28/2020     CHIEF COMPLAINT Patient presents for Post-op Follow-up   HISTORY OF PRESENT ILLNESS: Kathleen Hammond is a 64 y.o. female who presents to the clinic today for:   HPI    Post-op Follow-up    In left eye.  Discomfort includes floaters and itching.  Negative for pain.  Vision is stable.          Comments    2 week follow up - FP OU (vitrectomy, cryo, laser retinopexy and injection of gas) Pt states that her eye is doing well but she still occasionally sees floaters, and it feels tight underneath her eyebrow OS. Pt states that she has been seeing floaters in her right eye.       Last edited by Berenice Bouton on 03/28/2020  9:13 AM. (History)      Referring physician: Benita Stabile, MD [address] [name],  [place] [address]  HISTORICAL INFORMATION:   Selected notes from the MEDICAL RECORD NUMBER    Lab Results  Component Value Date   HGBA1C 5.3 08/28/2019     CURRENT MEDICATIONS: Current Outpatient Medications (Ophthalmic Drugs)  Medication Sig  . cyclopentolate (CYCLOGYL) 0.5 % ophthalmic solution Place 1 drop into the left eye in the morning, at noon, and at bedtime.  . prednisoLONE acetate (PRED FORTE) 1 % ophthalmic suspension Place 1 drop into the left eye 4 (four) times daily.   No current facility-administered medications for this visit. (Ophthalmic Drugs)   Current Outpatient Medications (Other)  Medication Sig  . albuterol (PROVENTIL HFA;VENTOLIN HFA) 108 (90 BASE) MCG/ACT inhaler Inhale 2 puffs into the lungs as needed (for asthma).   Marland Kitchen atorvastatin (LIPITOR) 10 MG tablet Take 10 mg by mouth daily.  . ergocalciferol (VITAMIN D2) 1.25 MG (50000 UT) capsule Take 50,000 Units by mouth once a week.   Marland Kitchen PANTOPRAZOLE SODIUM PO Take 40 mg by mouth daily.  . predniSONE (DELTASONE) 10 MG tablet 4 tabs for 2 days, then 3 tabs for 2 days, 2 tabs for 2 days, then 1 tab for 2 days, then stop  . SINGULAIR 10 MG tablet Take 1 tablet by mouth at bedtime.    No  current facility-administered medications for this visit. (Other)      REVIEW OF SYSTEMS:    ALLERGIES Allergies  Allergen Reactions  . Cefuroxime Axetil Shortness Of Breath  . Iodine Shortness Of Breath  . Avelox [Moxifloxacin Hcl In Nacl] Other (See Comments)    Hallucinations   . Macrodantin [Nitrofurantoin Macrocrystal] Nausea Only    Chills, aching  . Bactrim Rash    Septra  . Ceclor [Cefaclor] Rash    PAST MEDICAL HISTORY Past Medical History:  Diagnosis Date  . Alpha-1-antitrypsin deficiency (HCC)   . Asthma   . History of anemia   . Perimenopausal vasomotor symptoms   . Postmenopausal   . Urinary incontinence    Past Surgical History:  Procedure Laterality Date  . BREAST EXCISIONAL BIOPSY Right 1996  . CATARACT EXTRACTION Right 05/2014   [facility]  . CATARACT EXTRACTION Left 05/2014   [facility]  . CESAREAN SECTION    . CHOLECYSTECTOMY    . cyst removed from the gum    . DILATION AND CURETTAGE OF UTERUS    . GALLBLADDER SURGERY    . lump removed from the breast    . TUBAL LIGATION    . YAG LASER APPLICATION Left 2016   [facility]  . YAG LASER APPLICATION Right 2016  [facility]    FAMILY HISTORY Family History  Problem Relation Age of Onset  . Heart attack Father 71       massive heart attack  . Hypertension Father   . Cancer Mother        sinus   . Anemia Mother   . Hypertension Other   . Heart disease Other   . Stroke Other     SOCIAL HISTORY Social History   Tobacco Use  . Smoking status: Never Smoker  . Smokeless tobacco: Never Used  Substance Use Topics  . Alcohol use: Yes    Comment: occasional  . Drug use: No         OPHTHALMIC EXAM:  Base Eye Exam    Visual Acuity (Snellen - Linear)      Right Left   Dist cc 20/20-1 20/20-2   Correction: Glasses       Tonometry (Tonopen, 9:15 AM)      Right Left   Pressure 12 13       Neuro/Psych    Oriented x3: Yes   Mood/Affect: Normal       Dilation    Both eyes: 1.0% Mydriacyl,  2.5% Phenylephrine @ 9:15 AM        Slit Lamp and Fundus Exam    External Exam      Right Left   External Normal Normal       Slit Lamp Exam      Right Left   Lids/Lashes Normal Normal   Conjunctiva/Sclera White and quiet White and quiet   Cornea Clear Clear   Anterior Chamber Deep and quiet Deep and quiet   Iris Round and reactive Round and reactive   Lens Posterior chamber intraocular lens Posterior chamber intraocular lens   Anterior Vitreous Normal Normal       Fundus Exam      Right Left   Posterior Vitreous Posterior vitreous detachment Clear vitrectomized,   Disc Normal Normal   C/D Ratio 0.3 0.35   Macula Intermediate age related macular degeneration, Soft drusen, no macular thickening, no exudates    Vessels Normal Normal   Periphery Normal, no retinal breaks. OS superior to the nerve, along the equator.  No new retinal breaks, no detachment          IMAGING AND PROCEDURES  Imaging and Procedures for 03/28/20  Color Fundus Photography Optos - OU - Both Eyes       Right Eye Progression has been stable. Disc findings include normal observations. Macula : drusen. Vessels : normal observations.   Left Eye Progression has been stable. Disc findings include normal observations. Macula : drusen. Vessels : normal observations. Periphery : normal observations.   Notes OD, with PVD, also intermediate ARMD.   OS, no retinal holes, no retinal breaks, good retinopexy superiorly at the site of drainage of subretinal fluid.                ASSESSMENT/PLAN:  Intermediate stage nonexudative age-related macular degeneration of both eyes The nature of age--related macular degeneration was discussed with the patient as well as the distinction between dry and wet types. Checking an Amsler Grid daily with advice to return immediately should a distortion develop, was given to the patient. The patient 's smoking status now and in the past was determined and advice  based on the AREDS study was provided regarding the consumption of antioxidant supplements. AREDS 2 vitamin formulation was recommended. Consumption of dark leafy vegetables and fresh fruits of  various colors was recommended. Treatment modalities for wet macular degeneration particularly the use of intravitreal injections of anti-blood vessel growth factors was discussed with the patient. Avastin, Lucentis, and Eylea are the available options. On occasion, therapy includes the use of photodynamic therapy and thermal laser. Stressed to the patient do not rub eyes.  Patient was advised to check Amsler Grid daily and return immediately if changes are noted. Instructions on using the grid were given to the patient. All patient questions were answered.   Tolerate PreserVision likely due to the zinc.  I have encouraged him to use lutein supplements once daily  Posterior vitreous detachment of right eye   The nature of posterior vitreous detachment was discussed with the patient as well as its physiology, its age prevalence, and its possible implication regarding retinal breaks and detachment.  An informational brochure was given to the patient.  All the patient's questions were answered.  The patient was asked to return if new or different flashes or floaters develops.   Patient was instructed to contact office immediately if any changes were noticed. I explained to the patient that vitreous inside the eye is similar to jello inside a bowl. As the jello melts it can start to pull away from the bowl, similarly the vitreous throughout our lives can begin to pull away from the retina. That process is called a posterior vitreous detachment. In some cases, the vitreous can tug hard enough on the retina to form a retinal tear. I discussed with the patient the signs and symptoms of a retinal detachment.  Do not rub the eye.  No retinal holes or tears on examination today      ICD-10-CM   1. Retinal detachment of  left eye with retinal break  H33.002 Color Fundus Photography Optos - OU - Both Eyes  2. Posterior vitreous detachment of right eye  H43.811   3. Intermediate stage nonexudative age-related macular degeneration of both eyes  H35.3132     1.  Status post vitrectomy, retinal cryopexy, drainage of subretinal fluid and injection of gas left eye some 1 month previous.,  Retina is a attached.  No complications.  Is released for the next 4 months.  2.  Topical medications in the left eye to twice a day for 3 days and then she may discontinue their use.  3.  Ophthalmic Meds Ordered this visit:  No orders of the defined types were placed in this encounter.      Return in about 4 months (around 07/28/2020) for DILATE OU, OCT.  Patient Instructions  Patient to notify the office promptly if new symptoms develop or worsen in either eye.    Explained the diagnoses, plan, and follow up with the patient and they expressed understanding.  Patient expressed understanding of the importance of proper follow up care.   Alford Highland Arlenne Kimbley M.D. Diseases & Surgery of the Retina and Vitreous [facility] 03/28/20     Abbreviations: M myopia (nearsighted); A astigmatism; H hyperopia (farsighted); P presbyopia; Mrx spectacle prescription;  CTL contact lenses; OD right eye; OS left eye; OU both eyes  XT exotropia; ET esotropia; PEK punctate epithelial keratitis; PEE punctate epithelial erosions; DES dry eye syndrome; MGD meibomian gland dysfunction; ATs artificial tears; PFAT's preservative free artificial tears; NSC nuclear sclerotic cataract; PSC posterior subcapsular cataract; ERM epi-retinal membrane; PVD posterior vitreous detachment; RD retinal detachment; DM diabetes mellitus; DR diabetic retinopathy; NPDR non-proliferative diabetic retinopathy; PDR proliferative diabetic retinopathy; CSME clinically significant  macular edema; DME diabetic macular edema; dbh dot blot hemorrhages; CWS cotton  wool spot; POAG primary open angle glaucoma; C/D cup-to-disc ratio; HVF humphrey visual field; GVF goldmann visual field; OCT optical coherence tomography; IOP intraocular pressure; BRVO Branch retinal vein occlusion; CRVO central retinal vein occlusion; CRAO central retinal artery occlusion; BRAO branch retinal artery occlusion; RT retinal tear; SB scleral buckle; PPV pars plana vitrectomy; VH Vitreous hemorrhage; PRP panretinal laser photocoagulation; IVK intravitreal kenalog; VMT vitreomacular traction; MH Macular hole;  NVD neovascularization of the disc; NVE neovascularization elsewhere; AREDS age related eye disease study; ARMD age related macular degeneration; POAG primary open angle glaucoma; EBMD epithelial/anterior basement membrane dystrophy; ACIOL anterior chamber intraocular lens; IOL intraocular lens; PCIOL posterior chamber intraocular lens; Phaco/IOL phacoemulsification with intraocular lens placement; PRK photorefractive keratectomy; LASIK laser assisted in situ keratomileusis; HTN hypertension; DM diabetes mellitus; COPD chronic obstructive pulmonary disease

## 2020-04-02 ENCOUNTER — Telehealth: Payer: Self-pay | Admitting: Adult Health

## 2020-04-02 MED ORDER — AZITHROMYCIN 250 MG PO TABS: ORAL_TABLET | ORAL | 0 refills | Status: DC

## 2020-04-02 NOTE — Telephone Encounter (Signed)
Spoke with patient and relayed information regarding z-pack to patient.  Verified pharmacy with patient.  Prescription sent to pharmacy of patient choice.  Noting further needed.

## 2020-04-02 NOTE — Telephone Encounter (Signed)
Called and spoke with pt who stated she has had a flare with her asthma x1 week and stated 3 days ago her symptoms were worse. Pt stated she had some prednisone which was prescribed by her PCP prior that she did start taking. Pt stated she took 40mg  prednisone Sunday 8/8 and 40mg  Monday 8/9.  Pt stated that there are two days of 40mg  prednisone left on the Rx that was prescribed by her PCP.  Pt states that she does have some mild chest congestion along with tightness, some mild SOB, hoarseness, and some mild wheezing. Pt states that she has had to use her rescue inhaler at least twice daily which she states has helped with her symptoms.  Pt states she has been using cough drops due to having some throat irritation and to help her not to cough. Pt denies any complaints of fever.  Pt stated usually after two days on prednisone, it helps with the hoarseness and other symptoms but she states that she feels like she might need a zpak due to still having symptoms.  Dr. Thursday, please advise.

## 2020-04-02 NOTE — Telephone Encounter (Signed)
Please call in Z-Pak. 

## 2020-04-04 ENCOUNTER — Other Ambulatory Visit: Payer: Self-pay

## 2020-04-04 ENCOUNTER — Ambulatory Visit (INDEPENDENT_AMBULATORY_CARE_PROVIDER_SITE_OTHER): Payer: Medicare PPO | Admitting: Ophthalmology

## 2020-04-04 ENCOUNTER — Encounter (INDEPENDENT_AMBULATORY_CARE_PROVIDER_SITE_OTHER): Payer: Self-pay | Admitting: Ophthalmology

## 2020-04-04 DIAGNOSIS — H33002 Unspecified retinal detachment with retinal break, left eye: Secondary | ICD-10-CM

## 2020-04-04 NOTE — Progress Notes (Signed)
04/04/2020     CHIEF COMPLAINT Patient presents for Post-op Follow-up   HISTORY OF PRESENT ILLNESS: Kathleen Hammond is a 64 y.o. female who presents to the clinic today for:   HPI    Post-op Follow-up    In left eye.  Discomfort includes floaters.  Vision is stable.  I, the attending physician,  performed the HPI with the patient and updated documentation appropriately.          Comments    Pt c/o seeing a semi-circle in the lower field of vision of OS. Pt states the circle goes from light to dark in color. Pt states if she is in the sun it becomes worse. Pt states it does come and go but she has seen it longer today than any other day.       Last edited by Elyse Jarvis on 04/04/2020  3:33 PM. (History)      Referring physician: Benita Stabile, MD [address] [name],  [place] [address]  HISTORICAL INFORMATION:   Selected notes from the MEDICAL RECORD NUMBER    Lab Results  Component Value Date   HGBA1C 5.3 08/28/2019     CURRENT MEDICATIONS: Current Outpatient Medications (Ophthalmic Drugs)  Medication Sig  . cyclopentolate (CYCLOGYL) 0.5 % ophthalmic solution Place 1 drop into the left eye in the morning, at noon, and at bedtime.  . prednisoLONE acetate (PRED FORTE) 1 % ophthalmic suspension Place 1 drop into the left eye 4 (four) times daily.   No current facility-administered medications for this visit. (Ophthalmic Drugs)   Current Outpatient Medications (Other)  Medication Sig  . albuterol (PROVENTIL HFA;VENTOLIN HFA) 108 (90 BASE) MCG/ACT inhaler Inhale 2 puffs into the lungs as needed (for asthma).   Marland Kitchen atorvastatin (LIPITOR) 10 MG tablet Take 10 mg by mouth daily.  Marland Kitchen azithromycin (ZITHROMAX Z-PAK) 250 MG tablet 2 tablets today and 1 tablet daily until gone.  . ergocalciferol (VITAMIN D2) 1.25 MG (50000 UT) capsule Take 50,000 Units by mouth once a week.   Marland Kitchen PANTOPRAZOLE SODIUM PO Take 40 mg by mouth daily.  . predniSONE (DELTASONE) 10 MG tablet 4 tabs  for 2 days, then 3 tabs for 2 days, 2 tabs for 2 days, then 1 tab for 2 days, then stop  . SINGULAIR 10 MG tablet Take 1 tablet by mouth at bedtime.    No current facility-administered medications for this visit. (Other)      REVIEW OF SYSTEMS:    ALLERGIES Allergies  Allergen Reactions  . Cefuroxime Axetil Shortness Of Breath  . Iodine Shortness Of Breath  . Avelox [Moxifloxacin Hcl In Nacl] Other (See Comments)    Hallucinations   . Macrodantin [Nitrofurantoin Macrocrystal] Nausea Only    Chills, aching  . Bactrim Rash    Septra  . Ceclor [Cefaclor] Rash    PAST MEDICAL HISTORY Past Medical History:  Diagnosis Date  . Alpha-1-antitrypsin deficiency (HCC)   . Asthma   . History of anemia   . Perimenopausal vasomotor symptoms   . Postmenopausal   . Urinary incontinence    Past Surgical History:  Procedure Laterality Date  . BREAST EXCISIONAL BIOPSY Right 1996  . CATARACT EXTRACTION Right 05/2014   [facility]  . CATARACT EXTRACTION Left 05/2014   [facility]  . CESAREAN SECTION    . CHOLECYSTECTOMY    . cyst removed from the gum    . DILATION AND CURETTAGE OF UTERUS    . GALLBLADDER SURGERY    .  lump removed from the breast    . TUBAL LIGATION    . YAG LASER APPLICATION Left 2016   [facility]  . YAG LASER APPLICATION Right 2016   [facility]    FAMILY HISTORY Family History  Problem Relation Age of Onset  . Heart attack Father 38       massive heart attack  . Hypertension Father   . Cancer Mother        sinus   . Anemia Mother   . Hypertension Other   . Heart disease Other   . Stroke Other     SOCIAL HISTORY Social History   Tobacco Use  . Smoking status: Never Smoker  . Smokeless tobacco: Never Used  Substance Use Topics  . Alcohol use: Yes    Comment: occasional  . Drug use: No         OPHTHALMIC EXAM:  Base Eye Exam    Visual Acuity (Snellen - Linear)      Right Left   Dist cc 20/20 20/25 +   Correction: Glasses       Tonometry (Tonopen, 3:36  PM)      Right Left   Pressure 13 16       Pupils      Dark Light Shape React APD   Right 4 3 Round Slow None   Left 5 4 Round Slow None       Neuro/Psych    Oriented x3: Yes   Mood/Affect: Normal       Dilation    Left eye: 1.0% Mydriacyl, 2.5% Phenylephrine @ 3:36 PM        Slit Lamp and Fundus Exam    External Exam      Right Left   External Normal Normal       Slit Lamp Exam      Right Left   Lids/Lashes Normal Normal   Conjunctiva/Sclera White and quiet White and quiet   Cornea Clear Clear   Anterior Chamber Deep and quiet Deep and quiet   Iris Round and reactive Round and reactive   Lens Posterior chamber intraocular lens Posterior chamber intraocular lens   Anterior Vitreous Normal Normal       Fundus Exam      Right Left   Posterior Vitreous  Clear vitrectomized,   Disc  Normal   C/D Ratio  0.35   Macula  Normal   Vessels  Normal   Periphery  Retinotomy site superior to the nerve, along the equator.  No new retinal breaks, no detachment, scleral depression 28D, superior 180,          IMAGING AND PROCEDURES  Imaging and Procedures for 04/04/20           ASSESSMENT/PLAN:  Retinal detachment of left eye with retinal break OS, retina attached, symptom of a small dime size region inferiorly likely refers to the retinotomy drainage site and its attendant laser retinopexy. I explained to the patient this is likely to be permanent although typically it can fade slightly over time      ICD-10-CM   1. Retinal detachment of left eye with retinal break  H33.002 CANCELED: OCT, Retina - OU - Both Eyes    1. Patient has discontinued all topical medications left eye.  2. To report any new onset flashes of light curtain of darkness or new increasing shadow size 3.  Ophthalmic Meds Ordered this visit:  No orders of the defined types were placed in this encounter.  Return in about 4 months (around 08/04/2020) for As scheduled, COLOR FP,  DILATE OU.  There are no Patient Instructions on file for this visit.   Explained the diagnoses, plan, and follow up with the patient and they expressed understanding.  Patient expressed understanding of the importance of proper follow up care.   Alford Highland Dejion Grillo M.D. Diseases & Surgery of the Retina and Vitreous [facility] 04/04/20     Abbreviations: M myopia (nearsighted); A astigmatism; H hyperopia (farsighted); P presbyopia; Mrx spectacle prescription;  CTL contact lenses; OD right eye; OS left eye; OU both eyes  XT exotropia; ET esotropia; PEK punctate epithelial keratitis; PEE punctate epithelial erosions; DES dry eye syndrome; MGD meibomian gland dysfunction; ATs artificial tears; PFAT's preservative free artificial tears; NSC nuclear sclerotic cataract; PSC posterior subcapsular cataract; ERM epi-retinal membrane; PVD posterior vitreous detachment; RD retinal detachment; DM diabetes mellitus; DR diabetic retinopathy; NPDR non-proliferative diabetic retinopathy; PDR proliferative diabetic retinopathy; CSME clinically significant macular edema; DME diabetic macular edema; dbh dot blot hemorrhages; CWS cotton wool spot; POAG primary open angle glaucoma; C/D cup-to-disc ratio; HVF humphrey visual field; GVF goldmann visual field; OCT optical coherence tomography; IOP intraocular pressure; BRVO Branch retinal vein occlusion; CRVO central retinal vein occlusion; CRAO central retinal artery occlusion; BRAO branch retinal artery occlusion; RT retinal tear; SB scleral buckle; PPV pars plana vitrectomy; VH Vitreous hemorrhage; PRP panretinal laser photocoagulation; IVK intravitreal kenalog; VMT vitreomacular traction; MH Macular hole;  NVD neovascularization of the disc; NVE neovascularization elsewhere; AREDS age related eye disease study; ARMD age related macular degeneration; POAG primary open angle glaucoma; EBMD epithelial/anterior basement membrane dystrophy; ACIOL anterior  chamber intraocular lens; IOL intraocular lens; PCIOL posterior chamber intraocular lens; Phaco/IOL phacoemulsification with intraocular lens placement; PRK photorefractive keratectomy; LASIK laser assisted in situ keratomileusis; HTN hypertension; DM diabetes mellitus; COPD chronic obstructive pulmonary disease

## 2020-04-04 NOTE — Assessment & Plan Note (Signed)
OS, retina attached, symptom of a small dime size region inferiorly likely refers to the retinotomy drainage site and its attendant laser retinopexy. I explained to the patient this is likely to be permanent although typically it can fade slightly over time

## 2020-04-12 DIAGNOSIS — M810 Age-related osteoporosis without current pathological fracture: Secondary | ICD-10-CM | POA: Diagnosis not present

## 2020-04-12 DIAGNOSIS — K582 Mixed irritable bowel syndrome: Secondary | ICD-10-CM | POA: Diagnosis not present

## 2020-04-12 DIAGNOSIS — Z712 Person consulting for explanation of examination or test findings: Secondary | ICD-10-CM | POA: Diagnosis not present

## 2020-04-12 DIAGNOSIS — E785 Hyperlipidemia, unspecified: Secondary | ICD-10-CM | POA: Diagnosis not present

## 2020-04-12 DIAGNOSIS — E782 Mixed hyperlipidemia: Secondary | ICD-10-CM | POA: Diagnosis not present

## 2020-04-12 DIAGNOSIS — J019 Acute sinusitis, unspecified: Secondary | ICD-10-CM | POA: Diagnosis not present

## 2020-04-12 DIAGNOSIS — Z0189 Encounter for other specified special examinations: Secondary | ICD-10-CM | POA: Diagnosis not present

## 2020-04-12 DIAGNOSIS — E559 Vitamin D deficiency, unspecified: Secondary | ICD-10-CM | POA: Diagnosis not present

## 2020-04-12 DIAGNOSIS — E8801 Alpha-1-antitrypsin deficiency: Secondary | ICD-10-CM | POA: Diagnosis not present

## 2020-04-12 DIAGNOSIS — K219 Gastro-esophageal reflux disease without esophagitis: Secondary | ICD-10-CM | POA: Diagnosis not present

## 2020-05-06 ENCOUNTER — Other Ambulatory Visit: Payer: Self-pay | Admitting: Obstetrics and Gynecology

## 2020-05-06 DIAGNOSIS — Z1231 Encounter for screening mammogram for malignant neoplasm of breast: Secondary | ICD-10-CM

## 2020-05-13 DIAGNOSIS — K219 Gastro-esophageal reflux disease without esophagitis: Secondary | ICD-10-CM | POA: Diagnosis not present

## 2020-05-13 DIAGNOSIS — Z712 Person consulting for explanation of examination or test findings: Secondary | ICD-10-CM | POA: Diagnosis not present

## 2020-05-13 DIAGNOSIS — E8801 Alpha-1-antitrypsin deficiency: Secondary | ICD-10-CM | POA: Diagnosis not present

## 2020-05-13 DIAGNOSIS — E559 Vitamin D deficiency, unspecified: Secondary | ICD-10-CM | POA: Diagnosis not present

## 2020-05-13 DIAGNOSIS — E785 Hyperlipidemia, unspecified: Secondary | ICD-10-CM | POA: Diagnosis not present

## 2020-05-13 DIAGNOSIS — Z0189 Encounter for other specified special examinations: Secondary | ICD-10-CM | POA: Diagnosis not present

## 2020-05-13 DIAGNOSIS — M810 Age-related osteoporosis without current pathological fracture: Secondary | ICD-10-CM | POA: Diagnosis not present

## 2020-05-13 DIAGNOSIS — K582 Mixed irritable bowel syndrome: Secondary | ICD-10-CM | POA: Diagnosis not present

## 2020-05-13 DIAGNOSIS — E782 Mixed hyperlipidemia: Secondary | ICD-10-CM | POA: Diagnosis not present

## 2020-05-15 DIAGNOSIS — E559 Vitamin D deficiency, unspecified: Secondary | ICD-10-CM | POA: Diagnosis not present

## 2020-05-15 DIAGNOSIS — H33002 Unspecified retinal detachment with retinal break, left eye: Secondary | ICD-10-CM | POA: Diagnosis not present

## 2020-05-15 DIAGNOSIS — E8801 Alpha-1-antitrypsin deficiency: Secondary | ICD-10-CM | POA: Diagnosis not present

## 2020-05-15 DIAGNOSIS — K582 Mixed irritable bowel syndrome: Secondary | ICD-10-CM | POA: Diagnosis not present

## 2020-05-15 DIAGNOSIS — M810 Age-related osteoporosis without current pathological fracture: Secondary | ICD-10-CM | POA: Diagnosis not present

## 2020-05-15 DIAGNOSIS — K219 Gastro-esophageal reflux disease without esophagitis: Secondary | ICD-10-CM | POA: Diagnosis not present

## 2020-05-15 DIAGNOSIS — E782 Mixed hyperlipidemia: Secondary | ICD-10-CM | POA: Diagnosis not present

## 2020-05-17 ENCOUNTER — Ambulatory Visit
Admission: RE | Admit: 2020-05-17 | Discharge: 2020-05-17 | Disposition: A | Discharge status: Home / Self Care | Payer: Medicare PPO | Source: Ambulatory Visit

## 2020-05-17 ENCOUNTER — Other Ambulatory Visit: Payer: Self-pay

## 2020-05-17 DIAGNOSIS — Z1231 Encounter for screening mammogram for malignant neoplasm of breast: Secondary | ICD-10-CM | POA: Diagnosis not present

## 2020-06-17 NOTE — Telephone Encounter (Signed)
No antibody testing is not recommended.   Yes agree with recommendations to get Booster (Same Covid Vaccine she previously received. )

## 2020-06-17 NOTE — Telephone Encounter (Signed)
Tammy, please advise on pt email, thanks!  Tammy,  I am an Alpha 1 patient.  I am fully vaccinated for COVID. My 2nd shot was early Feb. and I'm 64 so I am eligible for the booster.  What are you all recommending? Is antibody testing recommended or do you simple recommend that I follow up and get the booster?

## 2020-06-24 NOTE — Telephone Encounter (Signed)
Tammy, please see new mychart message from pt and advise.   To: [facility]    From: Kathleen Hammond    Created: 06/24/2020 6:06 AM     *-*-*This message was handled on 06/24/2020 12:06 PM by Jaislyn Blinn P*-*-*  I took my Moderna booster this past Thursday. I have had issues with my asthma all weekend. I don't have any COVID symptoms such as cough, fever, loss of taste or smell, etc. This morning I am tight in my chest and feel like I've reached that point where I need to take prednisone. Is it safe for me to take it after receiving the booster?      I have updated pt's immunization list showing all 3 moderna vaccines.

## 2020-06-24 NOTE — Telephone Encounter (Signed)
This is a duplicate encounter to one that was already sent by pt and has been sent to provider. Closing this encounter.

## 2020-06-24 NOTE — Telephone Encounter (Signed)
Can we set her up for a televisit tomorrow morning with myself or Dr. Isaiah Serge to evaluate and decide if steroids are needed.  Please contact office for sooner follow up if symptoms do not improve or worsen or seek emergency care

## 2020-06-25 ENCOUNTER — Other Ambulatory Visit: Payer: Self-pay

## 2020-06-25 ENCOUNTER — Ambulatory Visit (INDEPENDENT_AMBULATORY_CARE_PROVIDER_SITE_OTHER): Payer: Medicare PPO | Admitting: Pulmonary Disease

## 2020-06-25 DIAGNOSIS — J4541 Moderate persistent asthma with (acute) exacerbation: Secondary | ICD-10-CM

## 2020-06-25 MED ORDER — BUDESONIDE-FORMOTEROL FUMARATE 160-4.5 MCG/ACT IN AERO: 2.0000 | INHALATION_SPRAY | Freq: Two times a day (BID) | RESPIRATORY_TRACT | 5 refills | Status: DC

## 2020-06-25 MED ORDER — PREDNISONE 20 MG PO TABS: ORAL_TABLET | ORAL | 0 refills | Status: DC

## 2020-06-25 NOTE — Addendum Note (Signed)
Addended by: Jacquiline Doe on: 06/25/2020 09:06 AM   Modules accepted: Orders

## 2020-06-25 NOTE — Progress Notes (Signed)
Kathleen Hammond    [id number]    October 07, 1955  Primary Care Physician:Hall, Kathleene Hazel, MD  Referring Physician: Benita Stabile, MD [address] [name],  [place] [address]  Virtual Visit via Telephone Note  I connected with Zyairah Wacha Frigon on 06/25/20 at  8:45 AM EDT by telephone and verified that I am speaking with the correct person using two identifiers.  Location: Patient: Home Provider: [facility], [address], [place], [place]   I discussed the limitations, risks, security and privacy concerns of performing an evaluation and management service by telephone and the availability of in person appointments. I also discussed with the patient that there may be a patient responsible charge related to this service. The patient expressed understanding and agreed to proceed.  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  Diagnosed over 10 years ago.  She was previously being followed by Dr. Juanetta Gosling and is seeking a new primary provider due to retirement.  Has been asymptomatic with no inhaler therapy.  She was also evaluated at [facility] and advised that she did not need replacement therapy.  Has history of asthma which appears to be mild.  Previously on Symbicort but currently on albuterol with good control of symptoms  Pets: No pets Occupation: Retired school principal Exposures: No known exposures.  No mold, hot tub, Jacuzzi Smoking history: Smoker Travel history: Significant travel history Relevant family history: No significant family history of lung disease.  Interim history: She had modena vaccine last week and has been feeling unwell since then with chest tightness, congestion, wheezing.  Denies any fevers, chills.  Outpatient Encounter Medications as of 06/25/2020  Medication Sig  . albuterol (PROVENTIL HFA;VENTOLIN HFA) 108 (90 BASE) MCG/ACT inhaler Inhale 2 puffs into  the lungs as needed (for asthma).   . ergocalciferol (VITAMIN D2) 1.25 MG (50000 UT) capsule Take 50,000 Units by mouth once a week.   Marland Kitchen PANTOPRAZOLE SODIUM PO Take 40 mg by mouth daily.  . rosuvastatin (CRESTOR) 5 MG tablet   . SINGULAIR 10 MG tablet Take 1 tablet by mouth at bedtime.   . predniSONE (DELTASONE) 10 MG tablet 4 tabs for 2 days, then 3 tabs for 2 days, 2 tabs for 2 days, then 1 tab for 2 days, then stop  . [DISCONTINUED] atorvastatin (LIPITOR) 10 MG tablet Take 10 mg by mouth daily. (Patient not taking: Reported on 06/25/2020)  . [DISCONTINUED] azithromycin (ZITHROMAX Z-PAK) 250 MG tablet 2 tablets today and 1 tablet daily until gone.  . [DISCONTINUED] cyclopentolate (CYCLOGYL) 0.5 % ophthalmic solution Place 1 drop into the left eye in the morning, at noon, and at bedtime. (Patient not taking: Reported on 06/25/2020)  . [DISCONTINUED] prednisoLONE acetate (PRED FORTE) 1 % ophthalmic suspension Place 1 drop into the left eye 4 (four) times daily. (Patient not taking: Reported on 06/25/2020)   No facility-administered encounter medications on file as of 06/25/2020.    Physical Exam: Televisit  Data Reviewed: Imaging: Chest x-ray 04/06/2012-no acute cardiopulmonary abnormality CT abdomen pelvis 05/22/13-visualized lung bases are normal. I have reviewed the images personally.  PFTs: 08/03/2018 FVC 3.29 [95%], FEV1 2.45 [92%], F/F 75, TLC 5.16 [96%], DLCO 14.58 [54%) No obstruction or restriction.  Moderate diffusion impairment.  Labs: Alpha-1 antitrypsin 06/14/2019-72, PI MZ Hepatic panel 06/14/2019-within normal limits  Assessment:  Asthma, acute bronchitis Symptoms started after recent Moderna vaccination.  She is  just on albuterol as needed We will call in prednisone 40 mg a day for 4 days and Symbicort inhaler for now We may be able to stop the controller medication once her symptoms have stabilized as she was well controlled before  Alpha-1 antitrypsin carrier  status Alpha-1 levels were low at last assessment.  No obstruction on PFTs in the past She will need repeat PFTs and reassessment in the office 3 months from now once she is recovered from the current asthma episode  Plan/Recommendations: - Prednisone, Symbicort - PFTs and follow-up visit in 3 months.  I discussed the assessment and treatment plan with the patient. The patient was provided an opportunity to ask questions and all were answered. The patient agreed with the plan and demonstrated an understanding of the instructions.   The patient was advised to call back or seek an in-person evaluation if the symptoms worsen or if the condition fails to improve as anticipated.  I provided 25 minutes of non-face-to-face time during this encounter.   Chilton Greathouse MD [facility] 06/25/2020, 8:47 AM

## 2020-06-25 NOTE — Telephone Encounter (Signed)
Spoke with pt on the phone. She has been added to Dr. Shirlee More schedule today at 0845. Nothing further was needed.

## 2020-06-25 NOTE — Patient Instructions (Signed)
We will call in prednisone 40 mg a day for 4 days Symbicort inhaler 160/4.5  Schedule PFTs in 2 to 3 months and follow-up in clinic after

## 2020-07-23 NOTE — Telephone Encounter (Signed)
Dr. Isaiah Serge please advise on patient mychart message. Thank you   I have been having sinus symptoms for over a week.  I finished a 4 dose burst pack of prednisone on Friday(40 mg prednisone for 4 days) for asthma symptoms hoping to avoid this getting into my chest. This morning, I am hoarse, tight in my bronchial area, congested, teeth aching, face tender, legs achy, etc.  I have no fever.  I haven't lost my sense of smell or taste. I just used my ProAir inhaler and my Symbicort inhaler.  Could you call me in Augmentin 875 mg and prednisone? Updated info..the nasal mucous I have is yellowish. Not coughing anything up from the chest congestion

## 2020-07-24 MED ORDER — PREDNISONE 10 MG PO TABS: 10.0000 mg | ORAL_TABLET | Freq: Every day | ORAL | 0 refills | Status: DC

## 2020-07-24 MED ORDER — PREDNISONE 10 MG PO TABS
ORAL_TABLET | ORAL | 0 refills | Status: DC
Start: 2020-07-24 — End: 2020-10-15

## 2020-07-24 MED ORDER — AMOXICILLIN-POT CLAVULANATE 875-125 MG PO TABS: 1.0000 | ORAL_TABLET | Freq: Two times a day (BID) | ORAL | 0 refills | Status: DC

## 2020-07-24 NOTE — Telephone Encounter (Signed)
I have sent in these prescriptions for Augmentin and prednisone

## 2020-07-29 ENCOUNTER — Ambulatory Visit (INDEPENDENT_AMBULATORY_CARE_PROVIDER_SITE_OTHER): Payer: Medicare PPO | Admitting: Ophthalmology

## 2020-07-29 ENCOUNTER — Encounter (INDEPENDENT_AMBULATORY_CARE_PROVIDER_SITE_OTHER): Payer: Self-pay | Admitting: Ophthalmology

## 2020-07-29 ENCOUNTER — Other Ambulatory Visit: Payer: Self-pay

## 2020-07-29 DIAGNOSIS — H33002 Unspecified retinal detachment with retinal break, left eye: Secondary | ICD-10-CM | POA: Diagnosis not present

## 2020-07-29 DIAGNOSIS — H43811 Vitreous degeneration, right eye: Secondary | ICD-10-CM

## 2020-07-29 DIAGNOSIS — H353132 Nonexudative age-related macular degeneration, bilateral, intermediate dry stage: Secondary | ICD-10-CM

## 2020-07-29 NOTE — Progress Notes (Signed)
07/29/2020     CHIEF COMPLAINT Patient presents for Retina Follow Up   HISTORY OF PRESENT ILLNESS: Kathleen Hammond is a 64 y.o. female who presents to the clinic today for:   HPI    Retina Follow Up    Patient presents with  Retinal Break/Detachment.  In left eye.  Severity is moderate.  Duration of 4 months.  Since onset it is stable.  I, the attending physician,  performed the HPI with the patient and updated documentation appropriately.          Comments    4 Month f\u OU. FP  Pt states no changes in vision. Pt has seen occasional floaters.       Last edited by Elyse Jarvis on 07/29/2020 11:01 AM. (History)      Referring physician: Benita Stabile, MD [address] [name],  [place] [address]  HISTORICAL INFORMATION:   Selected notes from the MEDICAL RECORD NUMBER    Lab Results  Component Value Date   HGBA1C 5.3 08/28/2019     CURRENT MEDICATIONS: No current outpatient medications on file. (Ophthalmic Drugs)   No current facility-administered medications for this visit. (Ophthalmic Drugs)   Current Outpatient Medications (Other)  Medication Sig  . albuterol (PROVENTIL HFA;VENTOLIN HFA) 108 (90 BASE) MCG/ACT inhaler Inhale 2 puffs into the lungs as needed (for asthma).   Marland Kitchen amoxicillin-clavulanate (AUGMENTIN) 875-125 MG tablet Take 1 tablet by mouth 2 (two) times daily.  . budesonide-formoterol (SYMBICORT) 160-4.5 MCG/ACT inhaler Inhale 2 puffs into the lungs 2 (two) times daily.  . ergocalciferol (VITAMIN D2) 1.25 MG (50000 UT) capsule Take 50,000 Units by mouth once a week.   Marland Kitchen PANTOPRAZOLE SODIUM PO Take 40 mg by mouth daily.  . predniSONE (DELTASONE) 10 MG tablet Take 1 tablet (10 mg total) by mouth daily with breakfast.  . predniSONE (DELTASONE) 10 MG tablet 4 tabs for 2 days, then 3 tabs for 2 days, 2 tabs for 2 days, then 1 tab for 2 days, then stop  . predniSONE (DELTASONE) 20 MG tablet Take 40mg  daily for 4 days  . rosuvastatin (CRESTOR) 5 MG tablet    . SINGULAIR 10 MG tablet Take 1 tablet by mouth at bedtime.    No current facility-administered medications for this visit. (Other)      REVIEW OF SYSTEMS:    ALLERGIES Allergies  Allergen Reactions  . Cefuroxime Axetil Shortness Of Breath  . Iodine Shortness Of Breath  . Avelox [Moxifloxacin Hcl In Nacl] Other (See Comments)    Hallucinations   . Macrodantin [Nitrofurantoin Macrocrystal] Nausea Only    Chills, aching  . Bactrim Rash    Septra  . Ceclor [Cefaclor] Rash    PAST MEDICAL HISTORY Past Medical History:  Diagnosis Date  . Alpha-1-antitrypsin deficiency (HCC)   . Asthma   . History of anemia   . Perimenopausal vasomotor symptoms   . Postmenopausal   . Urinary incontinence    Past Surgical History:  Procedure Laterality Date  . BREAST EXCISIONAL BIOPSY Right 1996  . CATARACT EXTRACTION Right 05/2014   [facility]  . CATARACT EXTRACTION Left 05/2014   [facility]  . CESAREAN SECTION    . CHOLECYSTECTOMY    . cyst removed from the gum    . DILATION AND CURETTAGE OF UTERUS    . GALLBLADDER SURGERY    . lump removed from the breast    . TUBAL LIGATION    . YAG LASER APPLICATION Left 2016  [facility]  . YAG LASER APPLICATION Right 2016   [facility]    FAMILY HISTORY Family History  Problem Relation Age of Onset  . Heart attack Father 80       massive heart attack  . Hypertension Father   . Cancer Mother        sinus   . Anemia Mother   . Hypertension Other   . Heart disease Other   . Stroke Other     SOCIAL HISTORY Social History   Tobacco Use  . Smoking status: Never Smoker  . Smokeless tobacco: Never Used  Substance Use Topics  . Alcohol use: Yes    Comment: occasional  . Drug use: No         OPHTHALMIC EXAM:  Base Eye Exam    Visual Acuity (Snellen - Linear)      Right Left   Dist cc 20/20 20/20 -1   Correction: Glasses       Tonometry (Tonopen, 11:04 AM)      Right Left   Pressure 12 14       Pupils      Pupils Dark Light Shape  React APD   Right PERRL 4 3 Round Brisk None   Left PERRL 5 4 Round Brisk None       Neuro/Psych    Oriented x3: Yes   Mood/Affect: Normal       Dilation    Both eyes: 1.0% Mydriacyl, 2.5% Phenylephrine @ 11:04 AM        Slit Lamp and Fundus Exam    External Exam      Right Left   External Normal Normal       Slit Lamp Exam      Right Left   Lids/Lashes Normal Normal   Conjunctiva/Sclera White and quiet White and quiet   Cornea Clear Clear   Anterior Chamber Deep and quiet Deep and quiet   Iris Round and reactive Round and reactive   Lens Posterior chamber intraocular lens Posterior chamber intraocular lens   Anterior Vitreous Normal Normal       Fundus Exam      Right Left   Posterior Vitreous Posterior vitreous detachment Clear vitrectomized,   Disc Normal Normal   C/D Ratio 0.35 0.4   Macula Intermediate age related macular degeneration, Soft drusen, no macular thickening, no exudates, Hard drusen, no hemorrhage Soft drusen, no macular thickening, no hemorrhage   Vessels Normal Normal   Periphery Normal, no retinal breaks. Retinotomy site superior to the nerve, along the equator.  No new retinal breaks, no detachment, scleral depression 28D, superior 180,          IMAGING AND PROCEDURES  Imaging and Procedures for 07/29/20  Color Fundus Photography Optos - OU - Both Eyes       Right Eye Progression has been stable. Disc findings include normal observations. Macula : normal observations. Vessels : normal observations. Periphery : normal observations.   Left Eye Progression has improved. Disc findings include normal observations. Macula : drusen. Vessels : normal observations.                 ASSESSMENT/PLAN:  Intermediate stage nonexudative age-related macular degeneration of both eyes The nature of age--related macular degeneration was discussed with the patient as well as the distinction between dry and wet types. Checking an Amsler Grid daily  with advice to return immediately should a distortion develop, was given to the patient. The patient 's smoking status now and  in the past was determined and advice based on the AREDS study was provided regarding the consumption of antioxidant supplements. AREDS 2 vitamin formulation was recommended. Consumption of dark leafy vegetables and fresh fruits of various colors was recommended. Treatment modalities for wet macular degeneration particularly the use of intravitreal injections of anti-blood vessel growth factors was discussed with the patient. Avastin, Lucentis, and Eylea are the available options. On occasion, therapy includes the use of photodynamic therapy and thermal laser. Stressed to the patient do not rub eyes.  Patient was advised to check Amsler Grid daily and return immediately if changes are noted. Instructions on using the grid were given to the patient. All patient questions were answered.  Posterior vitreous detachment of right eye   The nature of posterior vitreous detachment was discussed with the patient as well as its physiology, its age prevalence, and its possible implication regarding retinal breaks and detachment.  An informational brochure was given to the patient.  All the patient's questions were answered.  The patient was asked to return if new or different flashes or floaters develops.   Patient was instructed to contact office immediately if any changes were noticed. I explained to the patient that vitreous inside the eye is similar to jello inside a bowl. As the jello melts it can start to pull away from the bowl, similarly the vitreous throughout our lives can begin to pull away from the retina. That process is called a posterior vitreous detachment. In some cases, the vitreous can tug hard enough on the retina to form a retinal tear. I discussed with the patient the signs and symptoms of a retinal detachment.  Do not rub the eye.      ICD-10-CM   1. Retinal detachment of  left eye with retinal break  H33.002 Color Fundus Photography Optos - OU - Both Eyes  2. Intermediate stage nonexudative age-related macular degeneration of both eyes  H35.3132   3. Posterior vitreous detachment of right eye  H43.811     1.  OS, successful retinal detachment repair via vitrectomy, laser gas, July 2021.  No new findings no new complications no new progressions    2.  OD, posterior vitreous detachment no retinal holes or breaks  3.  Patient instructed to contact the office promptly for new onset visual acuity decline or distortions which could be from progression of age-related maculopathy  Ophthalmic Meds Ordered this visit:  No orders of the defined types were placed in this encounter.      Return in about 6 months (around 01/27/2021) for DILATE OU, COLOR FP.  There are no Patient Instructions on file for this visit.   Explained the diagnoses, plan, and follow up with the patient and they expressed understanding.  Patient expressed understanding of the importance of proper follow up care.   Alford Highland Arlo Buffone M.D. Diseases & Surgery of the Retina and Vitreous [facility] 07/29/20     Abbreviations: M myopia (nearsighted); A astigmatism; H hyperopia (farsighted); P presbyopia; Mrx spectacle prescription;  CTL contact lenses; OD right eye; OS left eye; OU both eyes  XT exotropia; ET esotropia; PEK punctate epithelial keratitis; PEE punctate epithelial erosions; DES dry eye syndrome; MGD meibomian gland dysfunction; ATs artificial tears; PFAT's preservative free artificial tears; NSC nuclear sclerotic cataract; PSC posterior subcapsular cataract; ERM epi-retinal membrane; PVD posterior vitreous detachment; RD retinal detachment; DM diabetes mellitus; DR diabetic retinopathy; NPDR non-proliferative diabetic retinopathy; PDR proliferative diabetic retinopathy; CSME clinically significant macular  edema; DME diabetic macular edema; dbh dot blot hemorrhages; CWS  cotton wool spot; POAG primary open angle glaucoma; C/D cup-to-disc ratio; HVF humphrey visual field; GVF goldmann visual field; OCT optical coherence tomography; IOP intraocular pressure; BRVO Branch retinal vein occlusion; CRVO central retinal vein occlusion; CRAO central retinal artery occlusion; BRAO branch retinal artery occlusion; RT retinal tear; SB scleral buckle; PPV pars plana vitrectomy; VH Vitreous hemorrhage; PRP panretinal laser photocoagulation; IVK intravitreal kenalog; VMT vitreomacular traction; MH Macular hole;  NVD neovascularization of the disc; NVE neovascularization elsewhere; AREDS age related eye disease study; ARMD age related macular degeneration; POAG primary open angle glaucoma; EBMD epithelial/anterior basement membrane dystrophy; ACIOL anterior chamber intraocular lens; IOL intraocular lens; PCIOL posterior chamber intraocular lens; Phaco/IOL phacoemulsification with intraocular lens placement; PRK photorefractive keratectomy; LASIK laser assisted in situ keratomileusis; HTN hypertension; DM diabetes mellitus; COPD chronic obstructive pulmonary disease

## 2020-07-29 NOTE — Assessment & Plan Note (Signed)

## 2020-07-29 NOTE — Assessment & Plan Note (Signed)

## 2020-07-30 ENCOUNTER — Telehealth: Payer: Self-pay | Admitting: Pulmonary Disease

## 2020-07-30 DIAGNOSIS — E8801 Alpha-1-antitrypsin deficiency: Secondary | ICD-10-CM

## 2020-07-30 NOTE — Telephone Encounter (Signed)
07/30/2020  Attempted to reach the patient.  Left voicemail.  We will route message to Dr. Isaiah Serge to see if he would like to reassess patient's alpha-1 antitrypsin levels.  These were tested a year ago and they were low at the last assessment.  Dr. Isaiah Serge please advise:  Patient has upcoming appointment with pulmonary function testing Do you also want her to have alpha-1 antitrypsin lab work done at that time? Do you want her to have any other additional lab work done at that time to your knowledge?  Please let us know and then we can follow-up with the patient  Elisha Headland, FNP

## 2020-08-01 NOTE — Telephone Encounter (Signed)
08/01/2020  I have contacted the patient.  She is aware to complete lab work when she comes in for pulmonary function testing.  Nothing further needed.  Elisha Headland, FNP

## 2020-08-01 NOTE — Telephone Encounter (Signed)
I have ordered alpha-1 antitrypsin levels and a metabolic panel Please have her stop by labs to get these collected when she comes in for PFTs on 1/4

## 2020-08-20 NOTE — Telephone Encounter (Signed)
08/20/2020  If you start to develop acute respiratory symptoms or symptoms of COVID-19 would recommend that you obtain Covid testing.  Would favor PCR testing.  I do not believe that at home test would be excepted for the monoclonal antibody infusion.  You would be candidate for the monoclonal antibody infusion given the fact your age is over 68.  And we would recommend that.  Our office does not coordinator schedule this.  We can refer you though.  If you do test positive please let us know and we can send this referral over to the monoclonal antibody infusion team.  If asymptomatic in greater than 10 days after exposure I do not believe that you need to reschedule pulmonary function testing or the office visit.  If you are having any sort of acute respiratory symptoms then you would need to obtain testing.  If testing negative okay to keep appointments.  If testing positive then we need to reschedule.  I hope that helps  Elisha Headland, FNP

## 2020-08-20 NOTE — Telephone Encounter (Signed)
Sending email to Arlys John since Dr. Isaiah Serge is off this week-    My daughter and her husband were at our house Christmas Eve for about 8 hours. They are vaccinated and had planned to get the booster this week. Of course, I am vaccinated and have had my booster. He tested positive for COVID on Sunday and she tested positive Monday.  I was able to secure 2 home tests today and will use one if I develop symptoms. I will notify you immediately. If I do get it, would I need for your office to schedule the monoclonal antibody infusion or do I call the phone number [facility] provides and schedule it myself? Im scheduled for my PF test and a visit with you next week. It will be 10+ days after my exposure on 12/24. Do I still need to reschedule?    Please advise if we need to reschedule pft and OV.  PFT is 08/27/20 and OV is 08/29/20.  Pt is vaccinated and has received booster.  Currently asymptomatic, 3 days after exposure.  Thanks!

## 2020-08-29 ENCOUNTER — Ambulatory Visit: Payer: Medicare PPO | Admitting: Pulmonary Disease

## 2020-09-19 ENCOUNTER — Ambulatory Visit: Payer: Medicare PPO | Admitting: Podiatry

## 2020-09-19 ENCOUNTER — Ambulatory Visit (INDEPENDENT_AMBULATORY_CARE_PROVIDER_SITE_OTHER): Payer: Medicare PPO

## 2020-09-19 ENCOUNTER — Other Ambulatory Visit: Payer: Self-pay

## 2020-09-19 ENCOUNTER — Encounter: Payer: Self-pay | Admitting: Podiatry

## 2020-09-19 DIAGNOSIS — M79671 Pain in right foot: Secondary | ICD-10-CM | POA: Diagnosis not present

## 2020-09-19 DIAGNOSIS — L600 Ingrowing nail: Secondary | ICD-10-CM

## 2020-09-19 DIAGNOSIS — M79672 Pain in left foot: Secondary | ICD-10-CM

## 2020-09-20 NOTE — Progress Notes (Signed)
Subjective:   Patient ID: Kathleen Hammond, female   DOB: 65 y.o.   MRN: [id number]   HPI Patient presents stating that she was concerned about big toe pain right possibility of infection ingrown toenail or arthritis or gout.  States it is been present for several weeks and she is done some soaks and also that she does not smoke and would like to be more active   Review of Systems  All other systems reviewed and are negative.       Objective:  Physical Exam Vitals and nursing note reviewed.  Constitutional:      Appearance: She is well-developed and well-nourished.  Cardiovascular:     Pulses: Intact distal pulses.  Pulmonary:     Effort: Pulmonary effort is normal.  Musculoskeletal:        General: Normal range of motion.  Skin:    General: Skin is warm.  Neurological:     Mental Status: She is alert.     Neurovascular status was found to be intact muscle strength was found to be adequate range of motion is found to be within normal limits.  Patient does have mild swelling of the inner phalangeal joint right big toe and on the medial border right hallux possibility for incurvated corner but currently not sore no redness no drainage associated with it.  Good digital perfusion well oriented x3     Assessment:  Possibility for low-grade inflammatory process of the inner phalangeal joint slightly proximal of the right hallux for possible ingrown toenail mild paronychia infection right hallux      Plan:  H&P reviewed condition with patient.  At this point I have recommended soaks and padding and applied a digital cushion to try to take pressure off the toe.  If symptoms persist over the next 2 to 3 weeks we may consider injection or possible ingrown toenail removal and patient will be watched and evaluated  X-rays taken and indicated no signs of arthritis in comparison with the left foot with no indications of narrowing of the joint surface or spur formation noted current

## 2020-10-02 ENCOUNTER — Encounter: Payer: Self-pay | Admitting: Podiatry

## 2020-10-07 NOTE — Telephone Encounter (Signed)
Yes I agree to continue to be more cautious as she is high risk with her underlying medical problems.  Can discuss in more detail when she sees Dr. Isaiah Serge later this month .

## 2020-10-07 NOTE — Telephone Encounter (Signed)
Please advise on patient mychart message  Hi Kathleen Hammond.Marland KitchenMarland KitchenMy little granddaughters are having a roller skating birthday party next Saturday for their birthday. Of course, I'm invited.  My concern is that I really haven't been anywhere in 2 years in a group of people due to the Alpha 1.  I attend church virtually and shop online..even my groceries.  It's hard to judge from the news just what the exact status of Omicron is right now. Would I be best advised to continue to avoid gatherings like this?  I see Dr. Rennis Harding on the 22nd for a PF test, blood work and office visit, so I wouldn't see him before this event on the 19th for the girls. Everyone will truly understand if I am not advised to attend.  I just still feel so nervous and uncertain about exposure to this virus.

## 2020-10-11 DIAGNOSIS — Z20822 Contact with and (suspected) exposure to covid-19: Secondary | ICD-10-CM | POA: Diagnosis not present

## 2020-10-15 ENCOUNTER — Encounter: Payer: Self-pay | Admitting: Pulmonary Disease

## 2020-10-15 ENCOUNTER — Ambulatory Visit: Payer: Medicare PPO | Admitting: Pulmonary Disease

## 2020-10-15 ENCOUNTER — Ambulatory Visit (INDEPENDENT_AMBULATORY_CARE_PROVIDER_SITE_OTHER): Payer: Medicare PPO | Admitting: Pulmonary Disease

## 2020-10-15 ENCOUNTER — Other Ambulatory Visit: Payer: Self-pay

## 2020-10-15 VITALS — BP 132/72 | HR 108 | Temp 97.8°F | Ht 66.0 in | Wt 168.6 lb

## 2020-10-15 DIAGNOSIS — J4531 Mild persistent asthma with (acute) exacerbation: Secondary | ICD-10-CM

## 2020-10-15 DIAGNOSIS — J4541 Moderate persistent asthma with (acute) exacerbation: Secondary | ICD-10-CM

## 2020-10-15 DIAGNOSIS — E8801 Alpha-1-antitrypsin deficiency: Secondary | ICD-10-CM

## 2020-10-15 LAB — PULMONARY FUNCTION TEST
DL/VA % pred: 98 %
DL/VA: 4.03 ml/min/mmHg/L
DLCO cor % pred: 84 %
DLCO cor: 17.71 ml/min/mmHg
DLCO unc % pred: 84 %
DLCO unc: 17.71 ml/min/mmHg
FEF 25-75 Post: 2.08 L/sec
FEF 25-75 Pre: 1.82 L/sec
FEF2575-%Change-Post: 14 %
FEF2575-%Pred-Post: 96 %
FEF2575-%Pred-Pre: 84 %
FEV1-%Change-Post: 2 %
FEV1-%Pred-Post: 93 %
FEV1-%Pred-Pre: 91 %
FEV1-Post: 2.4 L
FEV1-Pre: 2.33 L
FEV1FVC-%Change-Post: 4 %
FEV1FVC-%Pred-Pre: 96 %
FEV6-%Change-Post: -1 %
FEV6-%Pred-Post: 96 %
FEV6-%Pred-Pre: 98 %
FEV6-Post: 3.11 L
FEV6-Pre: 3.15 L
FEV6FVC-%Change-Post: 0 %
FEV6FVC-%Pred-Post: 104 %
FEV6FVC-%Pred-Pre: 104 %
FVC-%Change-Post: -1 %
FVC-%Pred-Post: 92 %
FVC-%Pred-Pre: 94 %
FVC-Post: 3.11 L
FVC-Pre: 3.15 L
Post FEV1/FVC ratio: 77 %
Post FEV6/FVC ratio: 100 %
Pre FEV1/FVC ratio: 74 %
Pre FEV6/FVC Ratio: 100 %
RV % pred: 78 %
RV: 1.76 L
TLC % pred: 88 %
TLC: 4.75 L

## 2020-10-15 LAB — CBC WITH DIFFERENTIAL/PLATELET
Basophils Absolute: 0.1 10*3/uL (ref 0.0–0.1)
Basophils Relative: 0.5 % (ref 0.0–3.0)
Eosinophils Absolute: 0.1 10*3/uL (ref 0.0–0.7)
Eosinophils Relative: 0.6 % (ref 0.0–5.0)
HCT: 41.5 % (ref 36.0–46.0)
Hemoglobin: 13.9 g/dL (ref 12.0–15.0)
Lymphocytes Relative: 19.3 % (ref 12.0–46.0)
Lymphs Abs: 2.2 10*3/uL (ref 0.7–4.0)
MCHC: 33.5 g/dL (ref 30.0–36.0)
MCV: 86.7 fl (ref 78.0–100.0)
Monocytes Absolute: 0.8 10*3/uL (ref 0.1–1.0)
Monocytes Relative: 6.8 % (ref 3.0–12.0)
Neutro Abs: 8.5 10*3/uL — ABNORMAL HIGH (ref 1.4–7.7)
Neutrophils Relative %: 72.8 % (ref 43.0–77.0)
Platelets: 270 10*3/uL (ref 150.0–400.0)
RBC: 4.79 Mil/uL (ref 3.87–5.11)
RDW: 13.5 % (ref 11.5–15.5)
WBC: 11.6 10*3/uL — ABNORMAL HIGH (ref 4.0–10.5)

## 2020-10-15 LAB — COMPREHENSIVE METABOLIC PANEL
ALT: 20 U/L (ref 0–35)
AST: 18 U/L (ref 0–37)
Albumin: 4.8 g/dL (ref 3.5–5.2)
Alkaline Phosphatase: 62 U/L (ref 39–117)
BUN: 17 mg/dL (ref 6–23)
CO2: 27 mEq/L (ref 19–32)
Calcium: 10.1 mg/dL (ref 8.4–10.5)
Chloride: 102 mEq/L (ref 96–112)
Creatinine, Ser: 0.88 mg/dL (ref 0.40–1.20)
GFR: 68.16 mL/min (ref >60.00)
Glucose, Bld: 96 mg/dL (ref 70–99)
Potassium: 3.5 mEq/L (ref 3.5–5.1)
Sodium: 140 mEq/L (ref 135–145)
Total Bilirubin: 0.5 mg/dL (ref 0.2–1.2)
Total Protein: 7.7 g/dL (ref 6.0–8.3)

## 2020-10-15 MED ORDER — AZITHROMYCIN 250 MG PO TABS: ORAL_TABLET | ORAL | 0 refills | Status: DC

## 2020-10-15 MED ORDER — PREDNISONE 20 MG PO TABS: ORAL_TABLET | ORAL | 0 refills | Status: DC

## 2020-10-15 NOTE — Progress Notes (Signed)
PFT done today. 

## 2020-10-15 NOTE — Progress Notes (Signed)
Kathleen Hammond    [id number]    June 28, 1956  Primary Care Physician:Hall, Kathleene Hazel, MD  Referring Physician: Benita Stabile, MD [address] 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  Diagnosed over 10 years ago.    Has been asymptomatic with no inhaler therapy.  She was also evaluated at [facility] and advised that she did not need replacement therapy.  Has history of asthma which appears to be mild.  Previously on Symbicort but currently on albuterol with good control of symptoms  In November 2021 she required prednisone for 5 days after she developed a flareup of asthma after Moderna booster  Pets: No pets Occupation: Retired school principal Exposures: No known exposures.  No mold, hot tub, Jacuzzi Smoking history: Smoker Travel history: Significant travel history Relevant family history: No significant family history of lung disease.  Interim history: Continues to do well except for today morning when she woke up with chest tightness, mild wheezing She is not taking the Symbicort on a regular basis.  Denies any cough, sputum production, fevers, chills  Outpatient Encounter Medications as of 10/15/2020  Medication Sig  . albuterol (PROVENTIL HFA;VENTOLIN HFA) 108 (90 BASE) MCG/ACT inhaler Inhale 2 puffs into the lungs as needed (for asthma).  . budesonide-formoterol (SYMBICORT) 160-4.5 MCG/ACT inhaler Inhale 2 puffs into the lungs 2 (two) times daily.  . ergocalciferol (VITAMIN D2) 1.25 MG (50000 UT) capsule Take 50,000 Units by mouth once a week.   Marland Kitchen PANTOPRAZOLE SODIUM PO Take 40 mg by mouth daily.  . rosuvastatin (CRESTOR) 5 MG tablet   . SINGULAIR 10 MG tablet Take 1 tablet by mouth at bedtime.   . predniSONE (DELTASONE) 10 MG tablet Take 1 tablet (10 mg total) by mouth daily with breakfast.  . predniSONE (DELTASONE) 20 MG tablet Take  40mg  daily for 4 days  . [DISCONTINUED] amoxicillin-clavulanate (AUGMENTIN) 875-125 MG tablet Take 1 tablet by mouth 2 (two) times daily.  . [DISCONTINUED] predniSONE (DELTASONE) 10 MG tablet 4 tabs for 2 days, then 3 tabs for 2 days, 2 tabs for 2 days, then 1 tab for 2 days, then stop   No facility-administered encounter medications on file as of 10/15/2020.    Physical Exam: Blood pressure 132/72, pulse (!) 108, temperature 97.8 F (36.6 C), temperature source Temporal, height 5\' 6"  (1.676 m), weight 168 lb 9.6 oz (76.5 kg), SpO2 99 %. Gen:      No acute distress HEENT:  EOMI, sclera anicteric Neck:     No masses; no thyromegaly Lungs:    Clear to auscultation bilaterally; normal respiratory effort CV:         Regular rate and rhythm; no murmurs Abd:      + bowel sounds; soft, non-tender; no palpable masses, no distension Ext:    No edema; adequate peripheral perfusion Skin:      Warm and dry; no rash Neuro: alert and oriented x 3 Psych: normal mood and affect  Data Reviewed: Imaging: Chest x-ray 04/06/2012-no acute cardiopulmonary abnormality CT abdomen pelvis 05/22/13-visualized lung bases are normal. Chest x-ray 06/14/2019-no acute cardiopulmonary abnormality I have reviewed the images personally.  PFTs: 08/03/2018 FVC 3.29 [95%], FEV1 2.45 [92%], F/F 75, TLC 5.16 [96%], DLCO 14.58 [54%) No obstruction or restriction.  Moderate diffusion impairment  10/15/2020 FVC 3.11 [92%], FEV1 2.40 [93%], F/F 77, TLC 4.75 [88%],  DLCO 17.71 [4%] Normal test   Labs: Alpha-1 antitrypsin 06/14/2019-72, PI MZ Hepatic panel 06/14/2019-within normal limits  CBC 06/13/2020- WBC 4.6, eos 8.8%, absolute eosinophil count 405  Assessment:  Mild asthma She is usually not on controller medication but appears to be having a mild flareup today with chest tightness, wheezing   I asked her to start using the Symbicort that she already has.  She will use 2 puffs twice daily for the next 2  weeks Call in a prescription for prednisone and Z-Pak to be held in reserve in case her symptoms worsen Check CBC with differential, IgE  Alpha-1 antitrypsin carrier status Alpha-1 levels were low at last assessment.  No obstruction on PFTs Recheck alpha-1 antitrypsin levels and metabolic panel  Plan/Recommendations: - Resume Symbicort - Check labs including metabolic panel, alpha-1 antitrypsin levels, CBC, IgE  Chilton Greathouse MD [facility] 06/25/2020, 8:47 AM

## 2020-10-15 NOTE — Patient Instructions (Signed)
We will get some labs today including comprehensive metabolic panel, alpha-1 antitrypsin levels, CBC with differential and IgE  Start using the Symbicort 2 puffs in the morning and 2 puffs in the evening as you are more short of breath with chest tightness We will call in prednisone 40 mg for 5 days and a Z-Pak to be held in reserve in case his symptoms worsen  Follow-up in 1 year.

## 2020-10-16 DIAGNOSIS — E8801 Alpha-1-antitrypsin deficiency: Secondary | ICD-10-CM

## 2020-10-17 NOTE — Telephone Encounter (Signed)
mychart message sent by pt requesting to know the interpretation of her recent labwork. Dr. Isaiah Serge, please advise.

## 2020-10-17 NOTE — Telephone Encounter (Signed)
Another message sent by pt: Klimaszewski, Shonna Chock, MD 8 minutes ago (9:09 AM)  I haven't seen my antitrypsin report come in yet so maybe I need to wait until that comes in. There are a few other results I'd like to discuss with him. Can this be done by phone?

## 2020-10-17 NOTE — Telephone Encounter (Signed)
I called and discussed results with patients. All labs are normal except for elevated WBC count. She is suffering from asthmatic bronchitis. I have asked her to take the z pack and she already has a prescription for that  On review of labs it appears that alpha-1 antitrypsin phenotype has been drawn and not the alpha one antitrypsin levels as I had wanted  Triage- Can you please order alpha-1 antitrypsin levels and she will come in next week to get it drawn.

## 2020-10-18 NOTE — Telephone Encounter (Signed)
Order for alpha 1 antitrypsin phenotype has been placed.

## 2020-10-22 NOTE — Telephone Encounter (Signed)
Acutally. Looking though the labs it seems that we already have collected the right lab on 2/22  Please let her know that the Alpha 1 antitrypsin levels are normal and we do not need to redraw labs

## 2020-10-22 NOTE — Telephone Encounter (Signed)
Dr. Isaiah Serge patient sent email regarding Alpha 1 labs. I see there are two orders in one for the phenotype and one for the Alpha 1 antitrypsin.  Which one do you want and which one can I delete?  Then I will let the patient know she can come in and get her lab drawn.

## 2020-10-23 LAB — IGE: IgE (Immunoglobulin E), Serum: 3 kU/L (ref <=114)

## 2020-10-23 LAB — ALPHA-1 ANTITRYPSIN PHENOTYPE: A-1 Antitrypsin, Ser: 92 mg/dL (ref 83–199)

## 2020-10-25 MED ORDER — ALBUTEROL SULFATE HFA 108 (90 BASE) MCG/ACT IN AERS: 2.0000 | INHALATION_SPRAY | Freq: Four times a day (QID) | RESPIRATORY_TRACT | 5 refills | Status: DC | PRN

## 2020-10-25 MED ORDER — BUDESONIDE-FORMOTEROL FUMARATE 160-4.5 MCG/ACT IN AERO: 2.0000 | INHALATION_SPRAY | Freq: Two times a day (BID) | RESPIRATORY_TRACT | 5 refills | Status: DC

## 2020-10-25 NOTE — Addendum Note (Signed)
Addended by: Delrae Rend on: 10/25/2020 10:51 AM   Modules accepted: Orders

## 2020-10-31 DIAGNOSIS — K219 Gastro-esophageal reflux disease without esophagitis: Secondary | ICD-10-CM | POA: Diagnosis not present

## 2020-10-31 DIAGNOSIS — L600 Ingrowing nail: Secondary | ICD-10-CM | POA: Diagnosis not present

## 2020-10-31 DIAGNOSIS — E782 Mixed hyperlipidemia: Secondary | ICD-10-CM | POA: Diagnosis not present

## 2020-10-31 DIAGNOSIS — M810 Age-related osteoporosis without current pathological fracture: Secondary | ICD-10-CM | POA: Diagnosis not present

## 2020-10-31 DIAGNOSIS — E785 Hyperlipidemia, unspecified: Secondary | ICD-10-CM | POA: Diagnosis not present

## 2020-10-31 DIAGNOSIS — E8801 Alpha-1-antitrypsin deficiency: Secondary | ICD-10-CM | POA: Diagnosis not present

## 2020-10-31 DIAGNOSIS — Z0189 Encounter for other specified special examinations: Secondary | ICD-10-CM | POA: Diagnosis not present

## 2020-10-31 DIAGNOSIS — Z712 Person consulting for explanation of examination or test findings: Secondary | ICD-10-CM | POA: Diagnosis not present

## 2020-10-31 DIAGNOSIS — K582 Mixed irritable bowel syndrome: Secondary | ICD-10-CM | POA: Diagnosis not present

## 2020-10-31 DIAGNOSIS — E559 Vitamin D deficiency, unspecified: Secondary | ICD-10-CM | POA: Diagnosis not present

## 2020-11-01 IMAGING — DX DG CHEST 2V
2 series · 2 of 2 positions shown · non-contrast
Comparison: 04/06/2012

CLINICAL DATA: Follow-up for alpha-6 antitrypsin deficiency

EXAM:
CHEST - 2 VIEW

[chest pa]
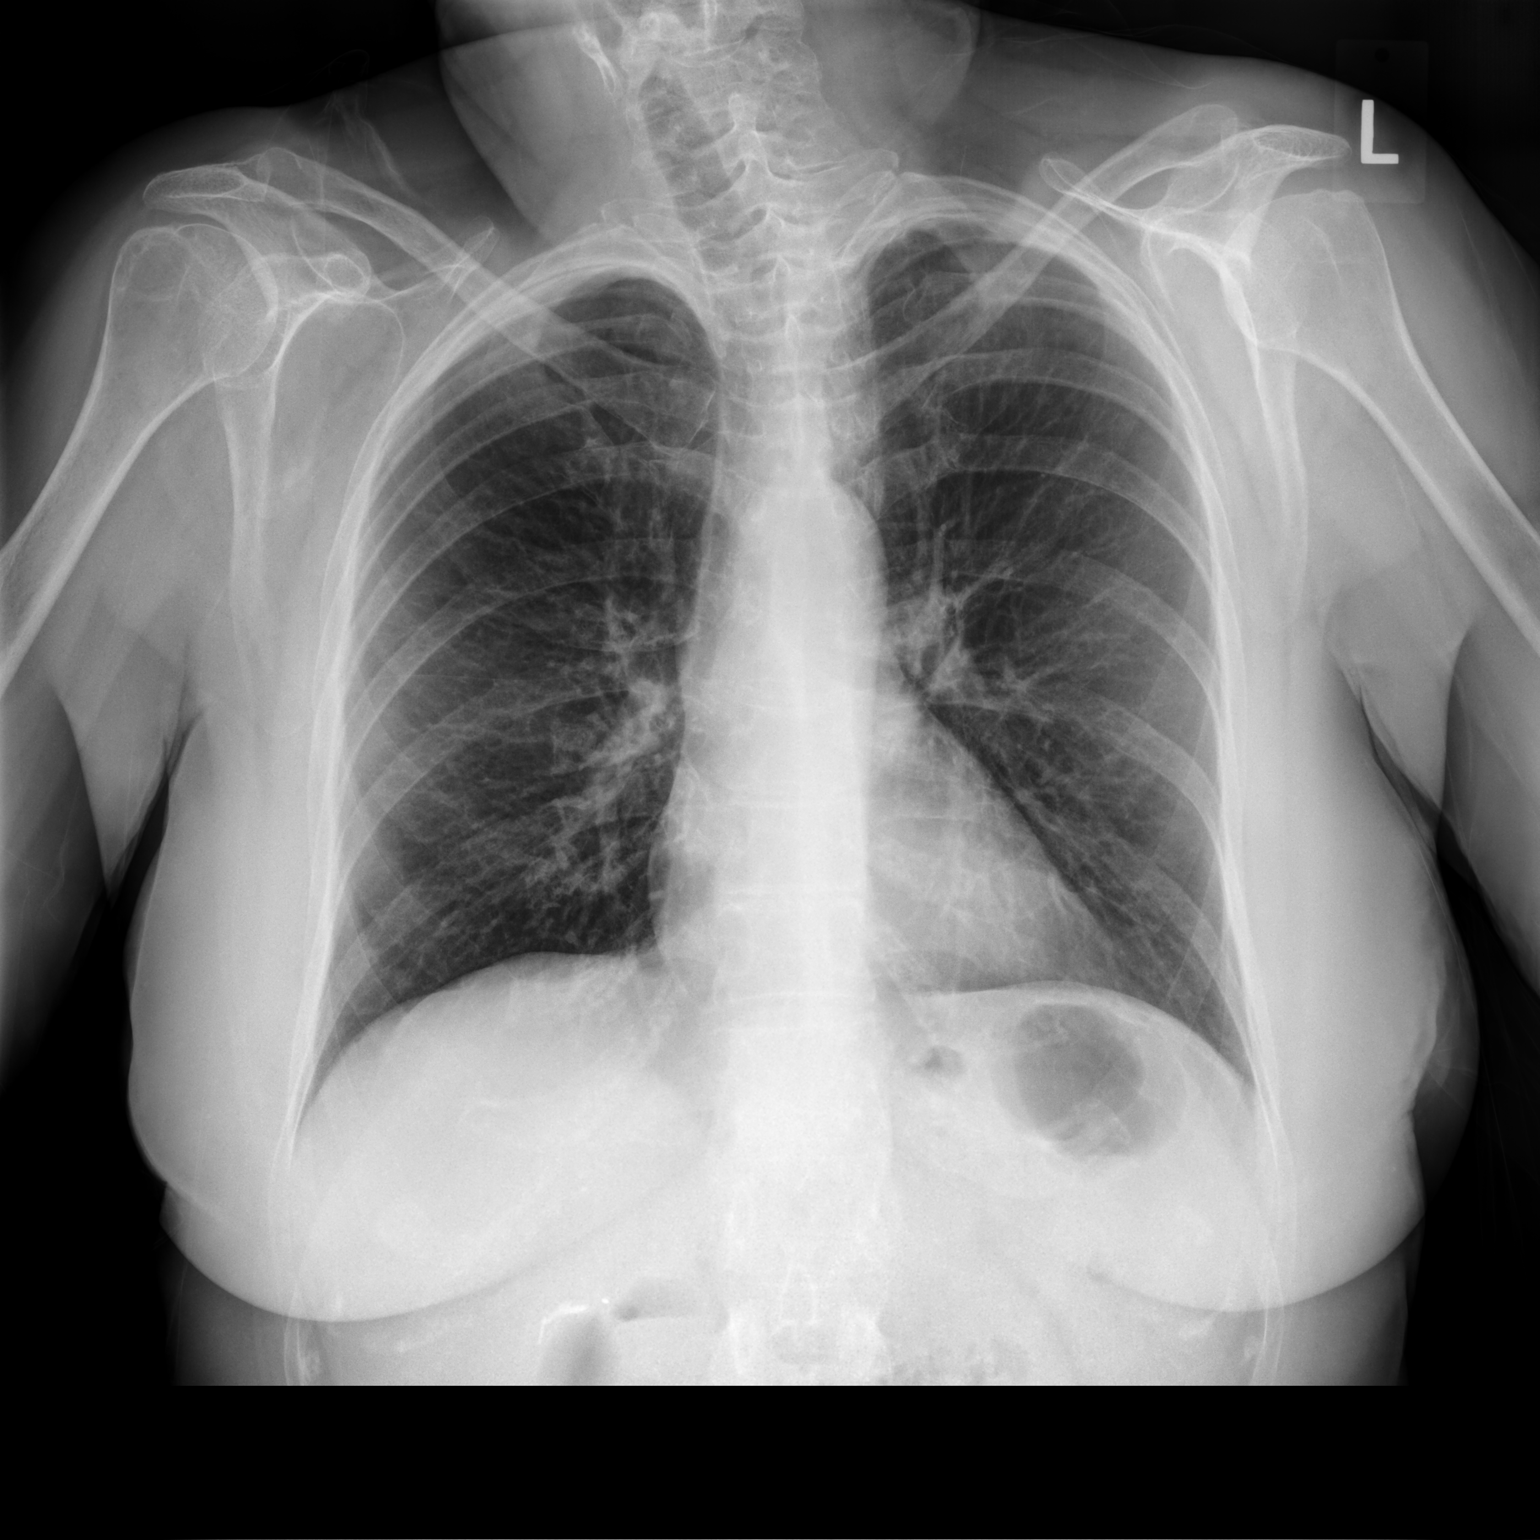

[chest lat]
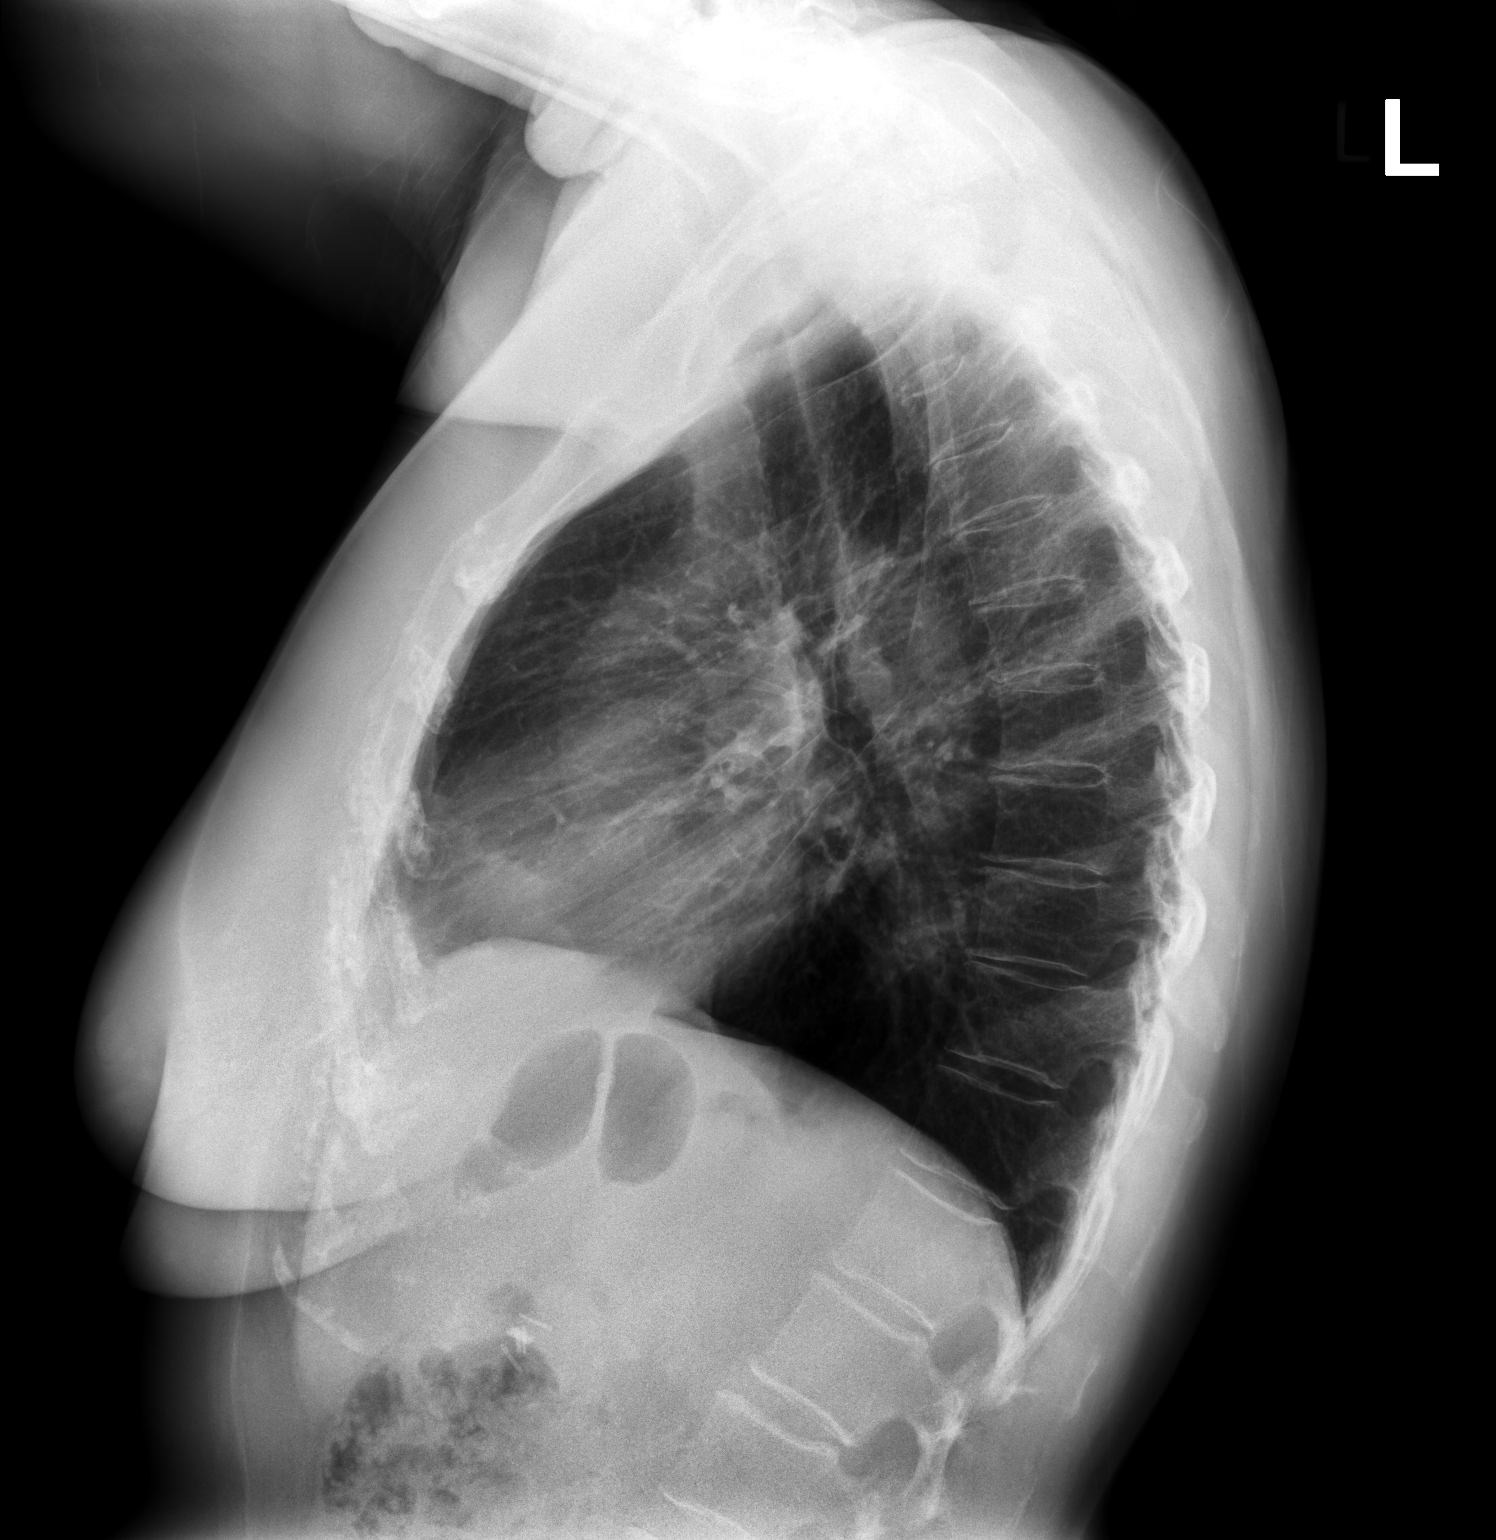

[2 of 2 positions shown; findings below may reference images not displayed]

FINDINGS: Normal heart size and mediastinal contours. No infiltrate or edema.
No effusion or pneumothorax. No acute osseous findings.
IMPRESSION: Stable chest with no definite or progressive hyperinflation.

## 2020-11-06 DIAGNOSIS — E8801 Alpha-1-antitrypsin deficiency: Secondary | ICD-10-CM | POA: Diagnosis not present

## 2020-11-06 DIAGNOSIS — M79674 Pain in right toe(s): Secondary | ICD-10-CM | POA: Diagnosis not present

## 2020-11-06 DIAGNOSIS — M810 Age-related osteoporosis without current pathological fracture: Secondary | ICD-10-CM | POA: Diagnosis not present

## 2020-11-07 DIAGNOSIS — M810 Age-related osteoporosis without current pathological fracture: Secondary | ICD-10-CM | POA: Diagnosis not present

## 2020-11-07 DIAGNOSIS — S92404A Nondisplaced unspecified fracture of right great toe, initial encounter for closed fracture: Secondary | ICD-10-CM | POA: Diagnosis not present

## 2020-11-07 DIAGNOSIS — E559 Vitamin D deficiency, unspecified: Secondary | ICD-10-CM | POA: Diagnosis not present

## 2020-11-07 DIAGNOSIS — R5383 Other fatigue: Secondary | ICD-10-CM | POA: Diagnosis not present

## 2020-11-15 DIAGNOSIS — E782 Mixed hyperlipidemia: Secondary | ICD-10-CM | POA: Diagnosis not present

## 2020-11-15 DIAGNOSIS — E785 Hyperlipidemia, unspecified: Secondary | ICD-10-CM | POA: Diagnosis not present

## 2020-11-15 DIAGNOSIS — E559 Vitamin D deficiency, unspecified: Secondary | ICD-10-CM | POA: Diagnosis not present

## 2020-11-20 DIAGNOSIS — K219 Gastro-esophageal reflux disease without esophagitis: Secondary | ICD-10-CM | POA: Diagnosis not present

## 2020-11-20 DIAGNOSIS — E782 Mixed hyperlipidemia: Secondary | ICD-10-CM | POA: Diagnosis not present

## 2020-11-20 DIAGNOSIS — E559 Vitamin D deficiency, unspecified: Secondary | ICD-10-CM | POA: Diagnosis not present

## 2020-11-20 DIAGNOSIS — K582 Mixed irritable bowel syndrome: Secondary | ICD-10-CM | POA: Diagnosis not present

## 2020-11-20 DIAGNOSIS — H33002 Unspecified retinal detachment with retinal break, left eye: Secondary | ICD-10-CM | POA: Diagnosis not present

## 2020-11-20 DIAGNOSIS — E8801 Alpha-1-antitrypsin deficiency: Secondary | ICD-10-CM | POA: Diagnosis not present

## 2020-11-20 DIAGNOSIS — M810 Age-related osteoporosis without current pathological fracture: Secondary | ICD-10-CM | POA: Diagnosis not present

## 2020-11-29 ENCOUNTER — Other Ambulatory Visit: Payer: Self-pay

## 2020-11-29 ENCOUNTER — Encounter: Payer: Self-pay | Admitting: Family Medicine

## 2020-11-29 ENCOUNTER — Ambulatory Visit: Payer: Medicare PPO | Admitting: Family Medicine

## 2020-11-29 DIAGNOSIS — M818 Other osteoporosis without current pathological fracture: Secondary | ICD-10-CM

## 2020-11-29 NOTE — Assessment & Plan Note (Signed)
Acute on chronic in nature.  She has tried Fosamax and has a strong family history of osteoporosis.  She continues to be osteoporotic despite treatment.  She has had anaphylactic reaction to bisphosphonate.  She is currently getting supplemented with vitamin D.  Her most likely successful outcome would be with a anabolic medication. -Counseled on home exercise therapy and supportive care. -[facility]

## 2020-11-29 NOTE — Progress Notes (Signed)
Kathleen Hammond - 65 y.o. female MRN [id number]  Date of birth: 23-Sep-1955  SUBJECTIVE:  Including CC & ROS.  No chief complaint on file.   Kathleen Hammond is a 65 y.o. female that is presenting with questions regarding osteoporosis.  She has a strong family history of osteoporosis and has had an anaphylactic reaction to bisphosphonate.  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  Her most recent bone density report shows osteoporosis.   Review of Systems See HPI   HISTORY: Past Medical, Surgical, Social, and Family History Reviewed & Updated per EMR.   Pertinent Historical Findings include:  Past Medical History:  Diagnosis Date  . Alpha-1-antitrypsin deficiency (HCC)   . Asthma   . History of anemia   . Perimenopausal vasomotor symptoms   . Postmenopausal   . Urinary incontinence     Past Surgical History:  Procedure Laterality Date  . BREAST EXCISIONAL BIOPSY Right 1996  . CATARACT EXTRACTION Right 05/2014   [facility]  . CATARACT EXTRACTION Left 05/2014   [facility]  . CESAREAN SECTION    . CHOLECYSTECTOMY    . cyst removed from the gum    . DILATION AND CURETTAGE OF UTERUS    . GALLBLADDER SURGERY    . lump removed from the breast    . TUBAL LIGATION    . YAG LASER APPLICATION Left 2016   [facility]  . YAG LASER APPLICATION Right 2016   [facility]    Family History  Problem Relation Age of Onset  . Heart attack Father 63       massive heart attack  . Hypertension Father   . Cancer Mother        sinus   . Anemia Mother   . Hypertension Other   . Heart disease Other   . Stroke Other     Social History   Socioeconomic History  . Marital status: Married    Spouse name: Not on file  . Number of children: Not on file  . Years of education: Not on file  . Highest education level: Not on file  Occupational History  . Not on file  Tobacco Use  . Smoking status: Never Smoker  . Smokeless tobacco: Never Used  Substance and Sexual Activity  . Alcohol use: Yes     Comment: occasional  . Drug use: No  . Sexual activity: Not on file  Other Topics Concern  . Not on file  Social History Narrative  . Not on file   Social Determinants of Health   Financial Resource Strain: Not on file  Food Insecurity: Not on file  Transportation Needs: Not on file  Physical Activity: Not on file  Stress: Not on file  Social Connections: Not on file  Intimate Partner Violence: Not on file     PHYSICAL EXAM:  VS: BP 118/72 (BP Location: Left Arm, Patient Position: Sitting, Cuff Size: Normal)   Ht 5\' 6"  (1.676 m)   Wt 168 lb (76.2 kg)   BMI 27.12 kg/m  Physical Exam Gen: NAD, alert, cooperative with exam, well-appearing    ASSESSMENT & PLAN:   Osteoporosis Acute on chronic in nature.  She has tried Fosamax and has a strong family history of osteoporosis.  She continues to be osteoporotic despite treatment.  She has had anaphylactic reaction to bisphosphonate.  She is currently getting supplemented with vitamin D.  Her most likely successful outcome would be with a anabolic medication. -Counseled on home exercise  therapy and supportive care. -[facility]

## 2020-12-03 MED ORDER — AZITHROMYCIN 250 MG PO TABS: ORAL_TABLET | ORAL | 0 refills | Status: DC

## 2020-12-03 NOTE — Telephone Encounter (Signed)
Ok to send [employer]

## 2020-12-03 NOTE — Telephone Encounter (Signed)
Spoke with the pt  She is c/o facial pressure and pain in her teeth past few days  She states that she is not coughing "yet" but feels "tightness in my bronchial tubes" She typically starts out this way when getting a sinus infections  She is not wheezing but has had slight increase in SOB  She states that she is going to start back on her symbicort today  Still on singulair  Wants zpack to hold in case gets worse over the holiday  Please advise thanks!  Allergies  Allergen Reactions  . Cefuroxime Axetil Shortness Of Breath  . Iodine Shortness Of Breath  . Avelox [Moxifloxacin Hcl In Nacl] Other (See Comments)    Hallucinations   . Macrodantin [Nitrofurantoin Macrocrystal] Nausea Only    Chills, aching  . Bactrim Rash    Septra  . Ceclor [Cefaclor] Rash

## 2020-12-27 DIAGNOSIS — H10413 Chronic giant papillary conjunctivitis, bilateral: Secondary | ICD-10-CM | POA: Diagnosis not present

## 2020-12-27 DIAGNOSIS — Z961 Presence of intraocular lens: Secondary | ICD-10-CM | POA: Diagnosis not present

## 2020-12-27 DIAGNOSIS — H5052 Exophoria: Secondary | ICD-10-CM | POA: Diagnosis not present

## 2020-12-27 DIAGNOSIS — H353131 Nonexudative age-related macular degeneration, bilateral, early dry stage: Secondary | ICD-10-CM | POA: Diagnosis not present

## 2020-12-27 DIAGNOSIS — H338 Other retinal detachments: Secondary | ICD-10-CM | POA: Diagnosis not present

## 2021-01-06 MED ORDER — AZITHROMYCIN 250 MG PO TABS: ORAL_TABLET | ORAL | 0 refills | Status: DC

## 2021-01-06 MED ORDER — PREDNISONE 20 MG PO TABS: 40.0000 mg | ORAL_TABLET | Freq: Every day | ORAL | 0 refills | Status: DC

## 2021-01-06 NOTE — Telephone Encounter (Signed)
Getting the second booster is appropriate at this time.  If you do not want to get it along with other injection then waiting for a month is fine  Okay to call Z-Pak and prednisone 40 mg a day for 5 days

## 2021-01-06 NOTE — Telephone Encounter (Signed)
Dr. Isaiah Serge please advise on booster recommendations as well as acute recommendations. Thanks!

## 2021-01-19 NOTE — Telephone Encounter (Signed)
PM please advise. Thanks    I've been sick for 2 weeks now. I finished the ZPak and 5 days of prednisone that you called in for me on May 16th.  While on the prednisone, my breathing was better.  [place].  My husband has had it too.  I have taken 5 home COVID tests...all negative. I am still congested and am now tight in my chest again.  I am taking Muscinex, Symbicort, and Albuterol inhaler.  Is it too soon for more prednisone?  Do I need to see you in the office?

## 2021-01-19 NOTE — Telephone Encounter (Signed)
PM please advise. Thanks  I took another COVID test early this morning. Negative.  I certainly will. I also wonder if I need another antibiotic. The ZPak didn't seem to do much. Sometimes he gives me Augmentin. The prednisone really helped with the tightness in my chest but as soon as I came off of it, it came back. This morning, I am tight all across my upper chest/ bronchial area.

## 2021-01-22 ENCOUNTER — Other Ambulatory Visit: Payer: Self-pay | Admitting: *Deleted

## 2021-01-22 ENCOUNTER — Telehealth: Payer: Self-pay | Admitting: Family Medicine

## 2021-01-22 MED ORDER — PREDNISONE 10 MG PO TABS: ORAL_TABLET | ORAL | 0 refills | Status: DC

## 2021-01-22 MED ORDER — AMOXICILLIN-POT CLAVULANATE 875-125 MG PO TABS: 1.0000 | ORAL_TABLET | Freq: Two times a day (BID) | ORAL | 0 refills | Status: DC

## 2021-01-22 NOTE — Telephone Encounter (Signed)
Ok to call in augmentin 875 mg bid for 7 days Additional prednisone starting at 60 mg/day.  Reduce by 10 mg every 2 days  She will need to be seen in office a chest x-ray if not improved

## 2021-01-22 NOTE — Telephone Encounter (Signed)
Patient called states she has been sick, was put on Antibiotic --wonders if she should rcv the [facility] Inject since she is on Rx & sick  ---Please advise.-Call [phone]  (she has appt Monday, 6/6 for the injection)  -glh

## 2021-01-27 ENCOUNTER — Ambulatory Visit: Payer: Medicare PPO | Admitting: Family Medicine

## 2021-01-27 ENCOUNTER — Encounter (INDEPENDENT_AMBULATORY_CARE_PROVIDER_SITE_OTHER): Payer: Medicare PPO | Admitting: Ophthalmology

## 2021-01-28 ENCOUNTER — Ambulatory Visit (INDEPENDENT_AMBULATORY_CARE_PROVIDER_SITE_OTHER): Payer: Medicare PPO | Admitting: Ophthalmology

## 2021-01-28 ENCOUNTER — Other Ambulatory Visit: Payer: Self-pay

## 2021-01-28 ENCOUNTER — Encounter (INDEPENDENT_AMBULATORY_CARE_PROVIDER_SITE_OTHER): Payer: Self-pay | Admitting: Ophthalmology

## 2021-01-28 DIAGNOSIS — H43811 Vitreous degeneration, right eye: Secondary | ICD-10-CM

## 2021-01-28 DIAGNOSIS — H353132 Nonexudative age-related macular degeneration, bilateral, intermediate dry stage: Secondary | ICD-10-CM

## 2021-01-28 DIAGNOSIS — H33002 Unspecified retinal detachment with retinal break, left eye: Secondary | ICD-10-CM | POA: Diagnosis not present

## 2021-01-28 NOTE — Assessment & Plan Note (Signed)

## 2021-01-28 NOTE — Patient Instructions (Signed)
Age-Related Macular Degeneration  Age-related macular degeneration (AMD) is an eye disease related to aging. The disease causes a loss of central vision. Central vision allows a person to see objects clearly and do daily tasks like reading and driving. There are two main types of AMD:  Dry AMD. People with this type generally lose their vision slowly. This is the most common type of AMD. Some people with dry AMD notice very little change in their vision as they age.  Wet AMD. People with this type can lose their vision quickly. What are the causes? This condition is caused by damage to the part of the eye that provides you with central vision (macula).  Dry AMD happens when deposits in the macula cause light-sensitive cells to slowly break down.  Wet AMD happens when abnormal blood vessels grow under the macula and leak blood and fluid. What increases the risk? You are more likely to develop this condition if you:  Are 50 years old or older, and especially 75 years old or older.  Smoke.  Are obese.  Have a family history of AMD.  Have high cholesterol, high blood pressure, or heart disease.  Have been exposed to high levels of ultraviolet (UV) light and blue light.  Are white (Caucasian).  Are female. What are the signs or symptoms? Common symptoms of this condition include:  Blurred vision, especially when reading print material. The blurred vision often improves in brighter light.  A blurred or blind spot in the center of your field of vision that is small but growing larger.  Bright colors seeming less bright than they used to be.  Decreased ability to recognize and see faces.  One eye seeing worse than the other.  Decreased ability to adapt to dimly lit rooms.  Straight lines appearing crooked or wavy. How is this diagnosed? This condition is diagnosed based on your symptoms and an eye exam. During the eye exam:  Eye drops will be placed into your eyes to  enlarge (dilate) your pupils. This will allow your health care provider to see the back of your eye.  You may be asked to look at an image that looks like a checkerboard (Amsler grid). Early changes in your central vision may cause the grid to appear distorted. After the exam, you may be given one or both of these tests:  Fluorescein angiogram. This test determines whether you have dry or wet AMD.  Optical coherence tomography (OCT) test to evaluate deep layers of the retina. How is this treated? There is no cure for this condition, but treatment can help to slow down progression of the disease. This condition may be treated with:  Supplements, including vitamin C, vitamin E, beta carotene, and zinc.  Laser surgery to destroy new blood vessels or leaking blood vessels in your eye.  Injections of medicines into your eye to slow down the formation of abnormal blood vessels that may leak. These injections may need to be repeated on a routine basis. Follow these instructions at home:  Take over-the-counter and prescription medicines only as told by your health care provider.  Take vitamins and supplements as told by your health care provider.  Ask your health care provider for an Amsler grid. Use it every day to check each eye for vision changes.  Get an eye exam as often as told by your health care provider. Make sure to get an eye exam at least once every year.  Keep all follow-up visits as told by   your health care provider. This is important. Contact a health care provider if:  You notice any new changes in your vision. Get help right away if:  You suddenly lose vision or develop pain in the eye. Summary  Age-related macular degeneration (AMD) is an eye disease related to aging. There are two types of this condition: dry AMD and wet AMD.  This condition is caused by damage to the part of the eye that provides you with central vision (macula).  Once diagnosed with AMD, make sure  to get an eye exam every year, take supplements and vitamins as directed, use an Amsler grid at home, and follow up with your health care provider. This information is not intended to replace advice given to you by your health care provider. Make sure you discuss any questions you have with your health care provider. Document Revised: 02/16/2018 Document Reviewed: 02/16/2018 Elsevier Patient Education  2021 [employer]  

## 2021-01-28 NOTE — Assessment & Plan Note (Signed)

## 2021-01-28 NOTE — Progress Notes (Signed)
01/28/2021     CHIEF COMPLAINT Patient presents for Retina Follow Up (6 month fu OU and FP/Pt states, "everything seems to be stable and the same. I saw Dr. Dione BoozeGroat last month and my exam was good.")   HISTORY OF PRESENT ILLNESS: Kathleen Hammond is a 65 y.o. female who presents to the clinic today for:   HPI    Retina Follow Up    Diagnosis: Retinal Break/Detachment   Laterality: left eye   Onset: 6 months ago   Severity: mild   Duration: 6 months   Course: stable   Comments: 6 month fu OU and FP Pt states, "everything seems to be stable and the same. I saw Dr. Dione BoozeGroat last month and my exam was good."       Last edited by Kathleen Hammond, Kathleen Hammond, COA on 01/28/2021  8:54 AM. (History)      Referring physician: Benita Hammond, Kathleen Z, MD [address] [place],  [place] [id number]  HISTORICAL INFORMATION:   Selected notes from the MEDICAL RECORD NUMBER    Lab Results  Component Value Date   HGBA1C 5.3 08/28/2019     CURRENT MEDICATIONS: No current outpatient medications on file. (Ophthalmic Drugs)   No current facility-administered medications for this visit. (Ophthalmic Drugs)   Current Outpatient Medications (Other)  Medication Sig  . albuterol (VENTOLIN HFA) 108 (90 Base) MCG/ACT inhaler Inhale 2 puffs into the lungs every 6 (six) hours as needed (for asthma).  Marland Kitchen. amoxicillin-clavulanate (AUGMENTIN) 875-125 MG tablet Take 1 tablet by mouth 2 (two) times daily.  Marland Kitchen. azithromycin (ZITHROMAX Hammond-PAK) 250 MG tablet Take 2 tabs today, then 1 daily until gone  . budesonide-formoterol (SYMBICORT) 160-4.5 MCG/ACT inhaler Inhale 2 puffs into the lungs 2 (two) times daily.  . ergocalciferol (VITAMIN D2) 1.25 MG (50000 UT) capsule Take 50,000 Units by mouth once a week.   Marland Kitchen. PANTOPRAZOLE SODIUM PO Take 40 mg by mouth daily.  . predniSONE (DELTASONE) 10 MG tablet Take 1 tablet (10 mg total) by mouth daily with breakfast.  . predniSONE (DELTASONE) 10 MG tablet Take 60mg x2  day,50mg x2day,40mg x2day,30mg x2days,20mg x2days,10mg x's2day  . predniSONE (DELTASONE) 20 MG tablet Take 2 tablets (40 mg total) by mouth daily with breakfast.  . rosuvastatin (CRESTOR) 5 MG tablet   . SINGULAIR 10 MG tablet Take 1 tablet by mouth at bedtime.    No current facility-administered medications for this visit. (Other)      REVIEW OF SYSTEMS:    ALLERGIES Allergies  Allergen Reactions  . Cefuroxime Axetil Shortness Of Breath  . Iodine Shortness Of Breath  . Avelox [Moxifloxacin Hcl In Nacl] Other (See Comments)    Hallucinations   . Macrodantin [Nitrofurantoin Macrocrystal] Nausea Only    Chills, aching  . Bactrim Rash    Septra  . Ceclor [Cefaclor] Rash    PAST MEDICAL HISTORY Past Medical History:  Diagnosis Date  . Alpha-1-antitrypsin deficiency (HCC)   . Asthma   . History of anemia   . Perimenopausal vasomotor symptoms   . Postmenopausal   . Urinary incontinence    Past Surgical History:  Procedure Laterality Date  . BREAST EXCISIONAL BIOPSY Right 1996  . CATARACT EXTRACTION Right 05/2014   Groat  . CATARACT EXTRACTION Left 05/2014   Groat  . CESAREAN SECTION    . CHOLECYSTECTOMY    . cyst removed from the gum    . DILATION AND CURETTAGE OF UTERUS    . GALLBLADDER SURGERY    . lump removed from the  breast    . TUBAL LIGATION    . YAG LASER APPLICATION Left 2016   Groat  . YAG LASER APPLICATION Right 2016   Groat    FAMILY HISTORY Family History  Problem Relation Age of Onset  . Heart attack Father 73       massive heart attack  . Hypertension Father   . Cancer Mother        sinus   . Anemia Mother   . Hypertension Other   . Heart disease Other   . Stroke Other     SOCIAL HISTORY Social History   Tobacco Use  . Smoking status: Never Smoker  . Smokeless tobacco: Never Used  Substance Use Topics  . Alcohol use: Yes    Comment: occasional  . Drug use: No         OPHTHALMIC EXAM:  Base Eye Exam    Visual Acuity (ETDRS)       Right Left   Dist Marks 20/20 20/20 -1       Tonometry (Tonopen, 8:58 AM)      Right Left   Pressure 14 14       Pupils      Pupils Dark Light Shape React APD   Right PERRL 4 3 Round Brisk None   Left PERRL 5 4 Round Brisk None       Visual Fields (Counting fingers)      Left Right    Full Full       Extraocular Movement      Right Left    Full Full       Neuro/Psych    Oriented x3: Yes   Mood/Affect: Normal       Dilation    Both eyes: 1.0% Mydriacyl, 2.5% Phenylephrine @ 8:58 AM        Slit Lamp and Fundus Exam    External Exam      Right Left   External Normal Normal       Slit Lamp Exam      Right Left   Lids/Lashes Normal Normal   Conjunctiva/Sclera White and quiet White and quiet   Cornea Clear Clear   Anterior Chamber Deep and quiet Deep and quiet   Iris Round and reactive Round and reactive   Lens Posterior chamber intraocular lens Posterior chamber intraocular lens   Anterior Vitreous Normal Normal       Fundus Exam      Right Left   Posterior Vitreous Posterior vitreous detachment Clear vitrectomized,   Disc Normal Normal   C/D Ratio 0.35 0.4   Macula Intermediate age related macular degeneration, Soft drusen, no macular thickening, no exudates, Hard drusen, no hemorrhage Soft drusen, no macular thickening, no hemorrhage   Vessels Normal Normal   Periphery Normal, no retinal breaks. Retinotomy site superior to the nerve, along the equator.  No new retinal breaks, no detachment, scleral depression 28D, superior 180,          IMAGING AND PROCEDURES  Imaging and Procedures for 01/28/21  Color Fundus Photography Optos - OU - Both Eyes       Right Eye Progression has been stable. Disc findings include normal observations. Macula : normal observations, drusen. Vessels : normal observations. Periphery : normal observations.   Left Eye Progression has been stable. Disc findings include normal observations. Macula : drusen. Vessels :  normal observations.   Notes OU, stable ARMD, intermediate, no signs of complication  OS with a history of retinal  detachment, retinotomy site well treated.  No new retinal breaks OU.                ASSESSMENT/PLAN:  Intermediate stage nonexudative age-related macular degeneration of both eyes The nature of age--related macular degeneration was discussed with the patient as well as the distinction between dry and wet types. Checking an Amsler Grid daily with advice to return immediately should a distortion develop, was given to the patient. The patient 's smoking status now and in the past was determined and advice based on the AREDS study was provided regarding the consumption of antioxidant supplements. AREDS 2 vitamin formulation was recommended. Consumption of dark leafy vegetables and fresh fruits of various colors was recommended. Treatment modalities for wet macular degeneration particularly the use of intravitreal injections of anti-blood vessel growth factors was discussed with the patient. Avastin, Lucentis, and Eylea are the available options. On occasion, therapy includes the use of photodynamic therapy and thermal laser. Stressed to the patient do not rub eyes.  Patient was advised to check Amsler Grid daily and return immediately if changes are noted. Instructions on using the grid were given to the patient. All patient questions were answered.  Retinal detachment of left eye with retinal break 1 year post surgical repair, no new findings.  Posterior vitreous detachment of right eye  The nature of posterior vitreous detachment was discussed with the patient as well as its physiology, its age prevalence, and its possible implication regarding retinal breaks and detachment.  An informational brochure was offered to the patient.  All the patient's questions were answered.  The patient was asked to return if new or different flashes or floaters develops.   Patient was instructed to  contact office immediately if any new changes were noticed. I explained to the patient that vitreous inside the eye is similar to jello inside a bowl. As the jello melts it can start to pull away from the bowl, similarly the vitreous throughout our lives can begin to pull away from the retina. That process is called a posterior vitreous detachment. In some cases, the vitreous can tug hard enough on the retina to form a retinal tear. I discussed with the patient the signs and symptoms of a retinal detachment.  Do not rub the eye.        ICD-10-CM   1. Retinal detachment of left eye with retinal break  H33.002 Color Fundus Photography Optos - OU - Both Eyes    CANCELED: OCT, Retina - OU - Both Eyes  2. Intermediate stage nonexudative age-related macular degeneration of both eyes  H35.3132   3. Posterior vitreous detachment of right eye  H43.811     1.  Patient continues on eye vitamins in the form of lutein alone as she was unable to tolerate the formulations and included zinc.  This is for the slowing of age-related macular degeneration.  2.  For retinal examination each eye remained stable and no new findings  3.  Ophthalmic Meds Ordered this visit:  No orders of the defined types were placed in this encounter.      Return in about 6 months (around 07/30/2021) for DILATE OU, OCT.  Patient Instructions  Age-Related Macular Degeneration  Age-related macular degeneration (AMD) is an eye disease related to aging. The disease causes a loss of central vision. Central vision allows a person to see objects clearly and do daily tasks like reading and driving. There are two main types of AMD:  Dry AMD. People  with this type generally lose their vision slowly. This is the most common type of AMD. Some people with dry AMD notice very little change in their vision as they age.  Wet AMD. People with this type can lose their vision quickly. What are the causes? This condition is caused by damage  to the part of the eye that provides you with central vision (macula).  Dry AMD happens when deposits in the macula cause light-sensitive cells to slowly break down.  Wet AMD happens when abnormal blood vessels grow under the macula and leak blood and fluid. What increases the risk? You are more likely to develop this condition if you:  Are 10 years old or older, and especially 41 years old or older.  Smoke.  Are obese.  Have a family history of AMD.  Have high cholesterol, high blood pressure, or heart disease.  Have been exposed to high levels of ultraviolet (UV) light and blue light.  Are white (Caucasian).  Are female. What are the signs or symptoms? Common symptoms of this condition include:  Blurred vision, especially when reading print material. The blurred vision often improves in brighter light.  A blurred or blind spot in the center of your field of vision that is small but growing larger.  Bright colors seeming less bright than they used to be.  Decreased ability to recognize and see faces.  One eye seeing worse than the other.  Decreased ability to adapt to dimly lit rooms.  Straight lines appearing crooked or wavy. How is this diagnosed? This condition is diagnosed based on your symptoms and an eye exam. During the eye exam:  Eye drops will be placed into your eyes to enlarge (dilate) your pupils. This will allow your health care provider to see the back of your eye.  You may be asked to look at an image that looks like a checkerboard (Amsler grid). Early changes in your central vision may cause the grid to appear distorted. After the exam, you may be given one or both of these tests:  Fluorescein angiogram. This test determines whether you have dry or wet AMD.  Optical coherence tomography (OCT) test to evaluate deep layers of the retina. How is this treated? There is no cure for this condition, but treatment can help to slow down progression of the  disease. This condition may be treated with:  Supplements, including vitamin C, vitamin E, beta carotene, and zinc.  Laser surgery to destroy new blood vessels or leaking blood vessels in your eye.  Injections of medicines into your eye to slow down the formation of abnormal blood vessels that may leak. These injections may need to be repeated on a routine basis. Follow these instructions at home:  Take over-the-counter and prescription medicines only as told by your health care provider.  Take vitamins and supplements as told by your health care provider.  Ask your health care provider for an Amsler grid. Use it every day to check each eye for vision changes.  Get an eye exam as often as told by your health care provider. Make sure to get an eye exam at least once every year.  Keep all follow-up visits as told by your health care provider. This is important. Contact a health care provider if:  You notice any new changes in your vision. Get help right away if:  You suddenly lose vision or develop pain in the eye. Summary  Age-related macular degeneration (AMD) is an eye disease related to  aging. There are two types of this condition: dry AMD and wet AMD.  This condition is caused by damage to the part of the eye that provides you with central vision (macula).  Once diagnosed with AMD, make sure to get an eye exam every year, take supplements and vitamins as directed, use an Amsler grid at home, and follow up with your health care provider. This information is not intended to replace advice given to you by your health care provider. Make sure you discuss any questions you have with your health care provider. Document Revised: 02/16/2018 Document Reviewed: 02/16/2018 Elsevier Patient Education  2021 Elsevier Inc.     Explained the diagnoses, plan, and follow up with the patient and they expressed understanding.  Patient expressed understanding of the importance of proper follow  up care.   Alford Highland Lesley Atkin M.D. Diseases & Surgery of the Retina and Vitreous [facility] 01/28/21     Abbreviations: M myopia (nearsighted); A astigmatism; H hyperopia (farsighted); P presbyopia; Mrx spectacle prescription;  CTL contact lenses; OD right eye; OS left eye; OU both eyes  XT exotropia; ET esotropia; PEK punctate epithelial keratitis; PEE punctate epithelial erosions; DES dry eye syndrome; MGD meibomian gland dysfunction; ATs artificial tears; PFAT's preservative free artificial tears; NSC nuclear sclerotic cataract; PSC posterior subcapsular cataract; ERM epi-retinal membrane; PVD posterior vitreous detachment; RD retinal detachment; DM diabetes mellitus; DR diabetic retinopathy; NPDR non-proliferative diabetic retinopathy; PDR proliferative diabetic retinopathy; CSME clinically significant macular edema; DME diabetic macular edema; dbh dot blot hemorrhages; CWS cotton wool spot; POAG primary open angle glaucoma; C/D cup-to-disc ratio; HVF humphrey visual field; GVF goldmann visual field; OCT optical coherence tomography; IOP intraocular pressure; BRVO Branch retinal vein occlusion; CRVO central retinal vein occlusion; CRAO central retinal artery occlusion; BRAO branch retinal artery occlusion; RT retinal tear; SB scleral buckle; PPV pars plana vitrectomy; VH Vitreous hemorrhage; PRP panretinal laser photocoagulation; IVK intravitreal kenalog; VMT vitreomacular traction; MH Macular hole;  NVD neovascularization of the disc; NVE neovascularization elsewhere; AREDS age related eye disease study; ARMD age related macular degeneration; POAG primary open angle glaucoma; EBMD epithelial/anterior basement membrane dystrophy; ACIOL anterior chamber intraocular lens; IOL intraocular lens; PCIOL posterior chamber intraocular lens; Phaco/IOL phacoemulsification with intraocular lens placement; PRK photorefractive keratectomy; LASIK laser assisted in situ keratomileusis; HTN  hypertension; DM diabetes mellitus; COPD chronic obstructive pulmonary disease

## 2021-01-28 NOTE — Assessment & Plan Note (Signed)
1 year post surgical repair, no new findings.

## 2021-02-12 ENCOUNTER — Telehealth: Payer: Self-pay

## 2021-02-12 NOTE — Telephone Encounter (Signed)
Evenity VOB initiated via [email or web address] 

## 2021-02-13 NOTE — Telephone Encounter (Signed)
Agree with recommendations to test 3 days after he did or when symptoms develop to call us.

## 2021-02-13 NOTE — Telephone Encounter (Signed)
Patient sent email   My husband just tested positive for COVID. I don't have symptoms at this time. He took 2 home tests. Both were negative. Our family doctor did the rapid [facility] and it was positive. Should I get a rapid PCR or wait? Any other advice? He is in a mask and I am staying at the other end of our house.  I told her to test 3 days after he did or when symptoms develop to call us.    Sending to Dr. Isaiah Serge for further recommendations.

## 2021-02-14 NOTE — Telephone Encounter (Signed)
Pt ready for scheduling  Out-of-pocket cost due at time of visit: $$80  Primary: [employer] [place] co-insurance: $40 Admin fee co-insurance: $40  Secondary: n/a [employer] co-insurance:  Admin fee co-insurance:   Deductible: does not apply  Prior Auth: APPROVED [id number] Valid: 12/11/20-12/11/21

## 2021-02-15 DIAGNOSIS — Z20822 Contact with and (suspected) exposure to covid-19: Secondary | ICD-10-CM | POA: Diagnosis not present

## 2021-02-18 ENCOUNTER — Ambulatory Visit: Payer: Medicare PPO | Admitting: Family Medicine

## 2021-02-19 ENCOUNTER — Telehealth: Payer: Self-pay | Admitting: Pulmonary Disease

## 2021-02-19 DIAGNOSIS — U071 COVID-19: Secondary | ICD-10-CM | POA: Diagnosis not present

## 2021-02-19 MED ORDER — AZITHROMYCIN 250 MG PO TABS: ORAL_TABLET | ORAL | 0 refills | Status: DC

## 2021-02-19 MED ORDER — PREDNISONE 20 MG PO TABS: 40.0000 mg | ORAL_TABLET | Freq: Every day | ORAL | 0 refills | Status: DC

## 2021-02-19 NOTE — Telephone Encounter (Signed)
Attempted to call pt but unable to reach. Left message for her to return call. 

## 2021-02-19 NOTE — Telephone Encounter (Signed)
Called and spoke to pt. Pt states this morning she awoke with facial pain (in maxillary sinus), chest tightness, sinus congestion with little mucus, scratchy throat. Pt denies cough, sob, no wheezing,no f/c/s. Pt states what little mucus she does blow out of her nose, it is not discolored. Reading through pt's recent sick notes, she states she was completely at baseline for about two weeks before these symptoms presented. Pt states she tested negative for covid. Her husband tested positive on 6/22. Her med list has been updated, however, she is not taking symbicort. She states she only takes it when she has a 'flare up'. Pt states she will start this tonight. Pt also states she did not complete the augmentin (prescribed on 6/1 for sinus infection) because it caused lip tingling and felt as they were swelling. Pt states this is the second time she has taken Augmenting with this reaction, med added to allergy list. Pt is requesting recs from Dr. Isaiah Serge. Pt last seen 10/15/20 with rec to follow up in a year.    Dr. Isaiah Serge, please advise. Thanks.

## 2021-02-19 NOTE — Telephone Encounter (Signed)
Called and spoke to pt. Informed her of the recs per Dr. Mannam. Rx sent to preferred pharmacy. Pt verbalized understanding and denied any further questions or concerns at this time.   

## 2021-02-19 NOTE — Telephone Encounter (Signed)
Please call in z pack and prednisone 40mg/day for 5 days 

## 2021-02-19 NOTE — Telephone Encounter (Signed)
pt calling because she having a negative covid but  she having synptoms  states she is experiencing aching face & teeth,  slight tightness in chest around bronchical area,  ear are ringing, nasal congestion, able to see PA @PCP , via mychart visit  wanted to see if Dr wanted her to see someone in our office.  please advise [phone]

## 2021-03-03 ENCOUNTER — Ambulatory Visit: Payer: Medicare PPO | Admitting: Family Medicine

## 2021-03-04 DIAGNOSIS — Z13820 Encounter for screening for osteoporosis: Secondary | ICD-10-CM | POA: Diagnosis not present

## 2021-03-04 DIAGNOSIS — M818 Other osteoporosis without current pathological fracture: Secondary | ICD-10-CM | POA: Diagnosis not present

## 2021-03-08 NOTE — Telephone Encounter (Signed)
2nd canceled appointment. Please follow up with pt to see if she wishes to pursue Evenity.

## 2021-03-10 ENCOUNTER — Encounter: Payer: Self-pay | Admitting: Family Medicine

## 2021-03-10 NOTE — Telephone Encounter (Signed)
Dr. Isaiah Serge please advise on the following My Chart message:   My plans to go ahead and get booster #2 were delayed by a sinus infection and my husband getting COVID.  I wanted to get tested as you directed 3 days after he tested positive to make sure I didn't have it.  My results were negative.  Now my Vira Agar is whether I should go ahead and take the [employer] #2 now or wait until October when they are projected to have the new Moderna vaccine ready to administer that covers the current variants. It is so confusing to know what to do. I don't want to take a booster now and then turn around in a little over 2 months and take another vaccine to cover these variants that are currently becoming more rampant.  What do you advise?  Thank you

## 2021-03-11 NOTE — Telephone Encounter (Signed)
There is no correct answer as both options are reasonable. With the levels of infection being low it would be ok to wait for the new vaccine if it seems imminent later this year.

## 2021-03-28 ENCOUNTER — Other Ambulatory Visit: Payer: Self-pay | Admitting: Pulmonary Disease

## 2021-03-28 ENCOUNTER — Other Ambulatory Visit: Payer: Self-pay

## 2021-03-30 NOTE — Telephone Encounter (Signed)
PM please advise. Thanks  [place], [facility] I requested a refill of the last ZPak you prescribed for me due to another sinus infection that won't seem to clear up. I'm using my Symbicort andAlbuterol inhalers. I have taken a COVID test that was negative.

## 2021-03-31 ENCOUNTER — Telehealth: Payer: Self-pay | Admitting: Pulmonary Disease

## 2021-03-31 MED ORDER — AZITHROMYCIN 250 MG PO TABS: 250.0000 mg | ORAL_TABLET | ORAL | 0 refills | Status: DC

## 2021-03-31 NOTE — Telephone Encounter (Signed)
Ok to send in prescription for z pack

## 2021-03-31 NOTE — Telephone Encounter (Signed)
Spoke with pharmacist and gave clarification on rx- zpack # 1 as directed no rf Nothing further needed

## 2021-04-07 ENCOUNTER — Ambulatory Visit: Payer: Medicare PPO

## 2021-04-14 ENCOUNTER — Other Ambulatory Visit: Payer: Self-pay

## 2021-04-14 ENCOUNTER — Other Ambulatory Visit: Payer: Self-pay | Admitting: Obstetrics and Gynecology

## 2021-04-14 ENCOUNTER — Ambulatory Visit: Payer: Medicare PPO | Admitting: *Deleted

## 2021-04-14 DIAGNOSIS — M818 Other osteoporosis without current pathological fracture: Secondary | ICD-10-CM | POA: Diagnosis not present

## 2021-04-14 DIAGNOSIS — Z1231 Encounter for screening mammogram for malignant neoplasm of breast: Secondary | ICD-10-CM

## 2021-04-16 NOTE — Telephone Encounter (Addendum)
Inj #1 - 04/14/21 Inj #2 - 05/19/21 Inj #3 - 06/23/21 Inj #4 - 07/28/21 Inj #5 - appt 09/01/21 (re-verify before inj given) Inj #6 -  Inj #7 -  Inj #8 -  Inj #9 -  Inj #10 -  Inj #11 -  Inj #12 -

## 2021-04-23 DIAGNOSIS — M722 Plantar fascial fibromatosis: Secondary | ICD-10-CM | POA: Diagnosis not present

## 2021-04-23 DIAGNOSIS — S9031XA Contusion of right foot, initial encounter: Secondary | ICD-10-CM | POA: Diagnosis not present

## 2021-04-25 ENCOUNTER — Other Ambulatory Visit: Payer: Self-pay

## 2021-04-25 ENCOUNTER — Other Ambulatory Visit: Payer: Self-pay | Admitting: Pulmonary Disease

## 2021-04-25 NOTE — Telephone Encounter (Signed)
Patient states she has a sinus infection and needs Azithromycin. Would you like to refill this?

## 2021-04-25 NOTE — Telephone Encounter (Signed)
Dr Isaiah Serge, please advise on the following My Chart message:   Once again I am presenting with symptoms of a sinus infection. Slightly discolored mucus, throbbing teeth, achy ears, tender face, etc. This is my worst allergy season every year. I'd like to be proactive and get started on a ZPak and prednisone to hopefully avoid this moving down to my bronchial tubes and lungs. Could you please submit these for me to [facility] on [address] in [place]. If we need to do a virtual visit I'm fine with that. Thank you so much.   Thank you

## 2021-04-25 NOTE — Telephone Encounter (Signed)
Is requesting azithromycin and prednisone for stated sinus infection, would you like to refill these?

## 2021-04-29 MED ORDER — AZITHROMYCIN 250 MG PO TABS: ORAL_TABLET | ORAL | 0 refills | Status: AC

## 2021-04-29 MED ORDER — PREDNISONE 20 MG PO TABS: 40.0000 mg | ORAL_TABLET | Freq: Every day | ORAL | 0 refills | Status: DC

## 2021-04-29 NOTE — Telephone Encounter (Signed)
Ok to send in prescription for z pack and prednisone 40mg /day for 5 days

## 2021-05-11 NOTE — Telephone Encounter (Signed)
PM please advise. Thanks   Good morning. What is your recommendation concerning the new COVID booster?  I have had the first 2 doses of Moderna and 1  modernist booster that was given in October of 2021.

## 2021-05-12 NOTE — Telephone Encounter (Signed)
I would encourage you to get the new booster and it would keep you protected against the new strains.

## 2021-05-16 DIAGNOSIS — H43391 Other vitreous opacities, right eye: Secondary | ICD-10-CM | POA: Diagnosis not present

## 2021-05-19 ENCOUNTER — Ambulatory Visit (INDEPENDENT_AMBULATORY_CARE_PROVIDER_SITE_OTHER): Payer: Medicare PPO | Admitting: *Deleted

## 2021-05-19 ENCOUNTER — Encounter: Payer: Self-pay | Admitting: *Deleted

## 2021-05-19 DIAGNOSIS — M818 Other osteoporosis without current pathological fracture: Secondary | ICD-10-CM

## 2021-05-19 NOTE — Progress Notes (Signed)
Patient here for nurse visit for her second Evenity. Patient received SQ injections in bilateral arms. Patient tolerated injections well. She will return in 1 month for her next Evenity.

## 2021-05-21 DIAGNOSIS — E559 Vitamin D deficiency, unspecified: Secondary | ICD-10-CM | POA: Diagnosis not present

## 2021-05-21 DIAGNOSIS — E782 Mixed hyperlipidemia: Secondary | ICD-10-CM | POA: Diagnosis not present

## 2021-05-22 ENCOUNTER — Ambulatory Visit
Admission: RE | Admit: 2021-05-22 | Discharge: 2021-05-22 | Disposition: A | Discharge status: Home / Self Care | Payer: Medicare PPO | Source: Ambulatory Visit

## 2021-05-22 ENCOUNTER — Other Ambulatory Visit: Payer: Self-pay

## 2021-05-22 DIAGNOSIS — Z1231 Encounter for screening mammogram for malignant neoplasm of breast: Secondary | ICD-10-CM | POA: Diagnosis not present

## 2021-05-26 DIAGNOSIS — Z6827 Body mass index (BMI) 27.0-27.9, adult: Secondary | ICD-10-CM | POA: Diagnosis not present

## 2021-05-26 DIAGNOSIS — Z01419 Encounter for gynecological examination (general) (routine) without abnormal findings: Secondary | ICD-10-CM | POA: Diagnosis not present

## 2021-05-27 DIAGNOSIS — K219 Gastro-esophageal reflux disease without esophagitis: Secondary | ICD-10-CM | POA: Diagnosis not present

## 2021-05-27 DIAGNOSIS — E559 Vitamin D deficiency, unspecified: Secondary | ICD-10-CM | POA: Diagnosis not present

## 2021-05-27 DIAGNOSIS — H43811 Vitreous degeneration, right eye: Secondary | ICD-10-CM | POA: Diagnosis not present

## 2021-05-27 DIAGNOSIS — M810 Age-related osteoporosis without current pathological fracture: Secondary | ICD-10-CM | POA: Diagnosis not present

## 2021-05-27 DIAGNOSIS — M75101 Unspecified rotator cuff tear or rupture of right shoulder, not specified as traumatic: Secondary | ICD-10-CM | POA: Diagnosis not present

## 2021-05-27 DIAGNOSIS — E8801 Alpha-1-antitrypsin deficiency: Secondary | ICD-10-CM | POA: Diagnosis not present

## 2021-05-27 DIAGNOSIS — Z0001 Encounter for general adult medical examination with abnormal findings: Secondary | ICD-10-CM | POA: Diagnosis not present

## 2021-05-27 DIAGNOSIS — E782 Mixed hyperlipidemia: Secondary | ICD-10-CM | POA: Diagnosis not present

## 2021-05-27 DIAGNOSIS — K589 Irritable bowel syndrome without diarrhea: Secondary | ICD-10-CM | POA: Diagnosis not present

## 2021-06-04 ENCOUNTER — Other Ambulatory Visit: Payer: Self-pay | Admitting: Pulmonary Disease

## 2021-06-05 ENCOUNTER — Other Ambulatory Visit: Payer: Self-pay

## 2021-06-05 DIAGNOSIS — E8801 Alpha-1-antitrypsin deficiency: Secondary | ICD-10-CM | POA: Diagnosis not present

## 2021-06-05 DIAGNOSIS — J309 Allergic rhinitis, unspecified: Secondary | ICD-10-CM | POA: Diagnosis not present

## 2021-06-05 DIAGNOSIS — R062 Wheezing: Secondary | ICD-10-CM | POA: Diagnosis not present

## 2021-06-23 ENCOUNTER — Ambulatory Visit (INDEPENDENT_AMBULATORY_CARE_PROVIDER_SITE_OTHER): Payer: Medicare PPO | Admitting: *Deleted

## 2021-06-23 DIAGNOSIS — M8000XD Age-related osteoporosis with current pathological fracture, unspecified site, subsequent encounter for fracture with routine healing: Secondary | ICD-10-CM

## 2021-07-07 DIAGNOSIS — Z803 Family history of malignant neoplasm of breast: Secondary | ICD-10-CM | POA: Diagnosis not present

## 2021-07-07 DIAGNOSIS — Z9189 Other specified personal risk factors, not elsewhere classified: Secondary | ICD-10-CM | POA: Diagnosis not present

## 2021-07-10 ENCOUNTER — Other Ambulatory Visit: Payer: Self-pay | Admitting: *Deleted

## 2021-07-10 ENCOUNTER — Encounter: Payer: Self-pay | Admitting: Pulmonary Disease

## 2021-07-10 MED ORDER — AZITHROMYCIN 250 MG PO TABS: ORAL_TABLET | ORAL | 0 refills | Status: DC

## 2021-07-10 MED ORDER — PREDNISONE 20 MG PO TABS: ORAL_TABLET | ORAL | 0 refills | Status: DC

## 2021-07-10 NOTE — Telephone Encounter (Signed)
PM please advise. Thanks  

## 2021-07-10 NOTE — Telephone Encounter (Signed)
Okay to send an order for Z-Pak and prednisone 40 mg a day for 5 days

## 2021-07-28 ENCOUNTER — Ambulatory Visit (INDEPENDENT_AMBULATORY_CARE_PROVIDER_SITE_OTHER): Payer: Medicare PPO | Admitting: *Deleted

## 2021-07-28 DIAGNOSIS — M8000XD Age-related osteoporosis with current pathological fracture, unspecified site, subsequent encounter for fracture with routine healing: Secondary | ICD-10-CM

## 2021-07-28 NOTE — Progress Notes (Signed)
Patient here for nurse visit for Evenity. Patient received SQ injections in bilateral arms. Patient tolerated injections well. She will return in 1 month for her next Evenity. °

## 2021-07-31 ENCOUNTER — Ambulatory Visit (INDEPENDENT_AMBULATORY_CARE_PROVIDER_SITE_OTHER): Payer: Medicare PPO | Admitting: Ophthalmology

## 2021-07-31 ENCOUNTER — Other Ambulatory Visit: Payer: Self-pay

## 2021-07-31 ENCOUNTER — Encounter (INDEPENDENT_AMBULATORY_CARE_PROVIDER_SITE_OTHER): Payer: Self-pay | Admitting: Ophthalmology

## 2021-07-31 DIAGNOSIS — H353132 Nonexudative age-related macular degeneration, bilateral, intermediate dry stage: Secondary | ICD-10-CM

## 2021-07-31 DIAGNOSIS — H43811 Vitreous degeneration, right eye: Secondary | ICD-10-CM | POA: Diagnosis not present

## 2021-07-31 DIAGNOSIS — H33002 Unspecified retinal detachment with retinal break, left eye: Secondary | ICD-10-CM

## 2021-07-31 NOTE — Assessment & Plan Note (Signed)
Physiologic 

## 2021-07-31 NOTE — Assessment & Plan Note (Signed)
Retina attached stable overall

## 2021-07-31 NOTE — Assessment & Plan Note (Signed)

## 2021-07-31 NOTE — Progress Notes (Signed)
07/31/2021     CHIEF COMPLAINT Patient presents for  Chief Complaint  Patient presents with   Retina Follow Up    6 month fu OU and FP Pt states, "everything seems to be stable and the same. I saw Dr. Dione Booze last month and my exam was good."      HISTORY OF PRESENT ILLNESS: Kathleen Hammond is a 65 y.o. female who presents to the clinic today for:   HPI     Retina Follow Up           Diagnosis: Retinal Break/Detachment   Laterality: left eye   Onset: 6 months ago   Severity: mild   Duration: 6 months   Course: stable   Comments: 6 month fu OU and FP Pt states, "everything seems to be stable and the same. I saw Dr. Dione Booze last month and my exam was good."         Comments   6 mos fu OU oct. Patient states vision is stable and unchanged since last visit. Denies any new floaters or FOL. Pt states she saw the PA at Dr. Lucious [facility] office a month ago for floaters in the left eye, for a new sudden onset of multiple floaters. She states 2 days after the visit she noticed the floaters were gone.      Last edited by Nelva Nay on 07/31/2021  9:41 AM.      Referring physician: Sallye Lat, MD [address] [place],  [place] [address]  HISTORICAL INFORMATION:   Selected notes from the MEDICAL RECORD NUMBER    Lab Results  Component Value Date   HGBA1C 5.3 08/28/2019     CURRENT MEDICATIONS: No current outpatient medications on file. (Ophthalmic Drugs)   No current facility-administered medications for this visit. (Ophthalmic Drugs)   Current Outpatient Medications (Other)  Medication Sig   albuterol (VENTOLIN HFA) 108 (90 Base) MCG/ACT inhaler Inhale 2 puffs into the lungs every 6 (six) hours as needed (for asthma).   azithromycin (ZITHROMAX Z-PAK) 250 MG tablet Take 2 tabs first day and 1 tab daily until completed   azithromycin (ZITHROMAX) 250 MG tablet Take as directed   budesonide-formoterol (SYMBICORT) 160-4.5 MCG/ACT inhaler Inhale 2 puffs into  the lungs 2 (two) times daily.   ergocalciferol (VITAMIN D2) 1.25 MG (50000 UT) capsule Take 50,000 Units by mouth once a week.    PANTOPRAZOLE SODIUM PO Take 40 mg by mouth daily.   predniSONE (DELTASONE) 20 MG tablet Take 2 tablets (40 mg total) by mouth daily with breakfast.   predniSONE (DELTASONE) 20 MG tablet Take 40mg  for 5 days   rosuvastatin (CRESTOR) 5 MG tablet    SINGULAIR 10 MG tablet Take 1 tablet by mouth at bedtime.    No current facility-administered medications for this visit. (Other)      REVIEW OF SYSTEMS:    ALLERGIES Allergies  Allergen Reactions   Cefuroxime Axetil Shortness Of Breath   Iodine Shortness Of Breath   Augmentin [Amoxicillin-Pot Clavulanate] Other (See Comments)    Pt c/o tingling in lips   Avelox [Moxifloxacin Hcl In Nacl] Other (See Comments)    Hallucinations    Macrodantin [Nitrofurantoin Macrocrystal] Nausea Only    Chills, aching   Bactrim Rash    Septra   Ceclor [Cefaclor] Rash    PAST MEDICAL HISTORY Past Medical History:  Diagnosis Date   Alpha-1-antitrypsin deficiency (HCC)    Asthma    History of anemia  Perimenopausal vasomotor symptoms    Postmenopausal    Urinary incontinence    Past Surgical History:  Procedure Laterality Date   BREAST EXCISIONAL BIOPSY Right 1996   CATARACT EXTRACTION Right 05/2014   [facility]   CATARACT EXTRACTION Left 05/2014   [facility]   CESAREAN SECTION     CHOLECYSTECTOMY     cyst removed from the gum     DILATION AND CURETTAGE OF UTERUS     GALLBLADDER SURGERY     lump removed from the breast     TUBAL LIGATION     YAG LASER APPLICATION Left 2016   [facility]   YAG LASER APPLICATION Right 2016   [facility]    FAMILY HISTORY Family History  Problem Relation Age of Onset   Heart attack Father 71       massive heart attack   Hypertension Father    Cancer Mother        sinus    Anemia Mother    Hypertension Other    Heart disease Other    Stroke Other     SOCIAL HISTORY Social History    Tobacco Use   Smoking status: Never   Smokeless tobacco: Never  Substance Use Topics   Alcohol use: Yes    Comment: occasional   Drug use: No         OPHTHALMIC EXAM:  Base Eye Exam     Visual Acuity (ETDRS)       Right Left   Dist cc 20/20 -1 20/20 -1    Correction: Glasses         Tonometry (Tonopen, 9:39 AM)       Right Left   Pressure 14 17         Pupils       Pupils Dark Light APD   Right PERRL 4 3 None   Left PERRL 5 4 None         Visual Fields (Counting fingers)       Left Right    Full Full         Extraocular Movement       Right Left    Full Full         Neuro/Psych     Oriented x3: Yes   Mood/Affect: Normal         Dilation     Both eyes: 1.0% Mydriacyl, 2.5% Phenylephrine @ 9:39 AM           Slit Lamp and Fundus Exam     External Exam       Right Left   External Normal Normal         Slit Lamp Exam       Right Left   Lids/Lashes Normal Normal   Conjunctiva/Sclera White and quiet White and quiet   Cornea Clear Clear   Anterior Chamber Deep and quiet Deep and quiet   Iris Round and reactive Round and reactive   Lens Posterior chamber intraocular lens Posterior chamber intraocular lens   Anterior Vitreous Normal Normal         Fundus Exam       Right Left   Posterior Vitreous Posterior vitreous detachment Clear vitrectomized   Disc Normal Normal   C/D Ratio 0.35 0.4   Macula Intermediate age related macular degeneration, Soft drusen, no macular thickening, no exudates, Hard drusen, no hemorrhage Soft drusen, no macular thickening, no hemorrhage   Vessels Normal Normal   Periphery Normal, no retinal breaks. Retinotomy  site superior to the nerve, along the equator.  No new retinal breaks, no detachment, scleral depression 28D, superior 180            IMAGING AND PROCEDURES  Imaging and Procedures for 07/31/21  OCT, Retina - OU - Both Eyes       Right Eye Quality was good. Scan  locations included subfoveal. Central Foveal Thickness: 297. Findings include normal observations, abnormal foveal contour, retinal drusen .   Left Eye Quality was good. Scan locations included subfoveal. Central Foveal Thickness: 308. Findings include normal observations, abnormal foveal contour, retinal drusen .   Notes No signs of CNVM OU.             ASSESSMENT/PLAN:  Intermediate stage nonexudative age-related macular degeneration of both eyes The nature of age--related macular degeneration was discussed with the patient as well as the distinction between dry and wet types. Checking an Amsler Grid daily with advice to return immediately should a distortion develop, was given to the patient. The patient 's smoking status now and in the past was determined and advice based on the AREDS study was provided regarding the consumption of antioxidant supplements. AREDS 2 vitamin formulation was recommended. Consumption of dark leafy vegetables and fresh fruits of various colors was recommended. Treatment modalities for wet macular degeneration particularly the use of intravitreal injections of anti-blood vessel growth factors was discussed with the patient. Avastin, Lucentis, and Eylea are the available options. On occasion, therapy includes the use of photodynamic therapy and thermal laser. Stressed to the patient do not rub eyes.  Patient was advised to check Amsler Grid daily and return immediately if changes are noted. Instructions on using the grid were given to the patient. All patient questions were answered.  Posterior vitreous detachment of right eye Physiologic  Retinal detachment of left eye with retinal break Retina attached stable overall     ICD-10-CM   1. Retinal detachment of left eye with retinal break  H33.002 OCT, Retina - OU - Both Eyes    2. Intermediate stage nonexudative age-related macular degeneration of both eyes  H35.3132 OCT, Retina - OU - Both Eyes    3.  Posterior vitreous detachment of right eye  H43.811       1.  OD, no change over the interval, PVD with no retinal breaks or tears  2.  OS with history of retinal detachment, repaired nicely July 2021 stable  3.  OU intermediate AMD no complications  Ophthalmic Meds Ordered this visit:  No orders of the defined types were placed in this encounter.      Return in about 1 year (around 07/31/2022) for DILATE OU, OCT, COLOR FP.  There are no Patient Instructions on file for this visit.   Explained the diagnoses, plan, and follow up with the patient and they expressed understanding.  Patient expressed understanding of the importance of proper follow up care.   Alford Highland Cortlyn Cannell M.D. Diseases & Surgery of the Retina and Vitreous [facility] 07/31/21     Abbreviations: M myopia (nearsighted); A astigmatism; H hyperopia (farsighted); P presbyopia; Mrx spectacle prescription;  CTL contact lenses; OD right eye; OS left eye; OU both eyes  XT exotropia; ET esotropia; PEK punctate epithelial keratitis; PEE punctate epithelial erosions; DES dry eye syndrome; MGD meibomian gland dysfunction; ATs artificial tears; PFAT's preservative free artificial tears; NSC nuclear sclerotic cataract; PSC posterior subcapsular cataract; ERM epi-retinal membrane; PVD posterior vitreous detachment; RD retinal detachment; DM diabetes  mellitus; DR diabetic retinopathy; NPDR non-proliferative diabetic retinopathy; PDR proliferative diabetic retinopathy; CSME clinically significant macular edema; DME diabetic macular edema; dbh dot blot hemorrhages; CWS cotton wool spot; POAG primary open angle glaucoma; C/D cup-to-disc ratio; HVF humphrey visual field; GVF goldmann visual field; OCT optical coherence tomography; IOP intraocular pressure; BRVO Branch retinal vein occlusion; CRVO central retinal vein occlusion; CRAO central retinal artery occlusion; BRAO branch retinal artery occlusion; RT retinal tear; SB  scleral buckle; PPV pars plana vitrectomy; VH Vitreous hemorrhage; PRP panretinal laser photocoagulation; IVK intravitreal kenalog; VMT vitreomacular traction; MH Macular hole;  NVD neovascularization of the disc; NVE neovascularization elsewhere; AREDS age related eye disease study; ARMD age related macular degeneration; POAG primary open angle glaucoma; EBMD epithelial/anterior basement membrane dystrophy; ACIOL anterior chamber intraocular lens; IOL intraocular lens; PCIOL posterior chamber intraocular lens; Phaco/IOL phacoemulsification with intraocular lens placement; PRK photorefractive keratectomy; LASIK laser assisted in situ keratomileusis; HTN hypertension; DM diabetes mellitus; COPD chronic obstructive pulmonary disease

## 2021-08-08 DIAGNOSIS — H903 Sensorineural hearing loss, bilateral: Secondary | ICD-10-CM | POA: Diagnosis not present

## 2021-08-08 DIAGNOSIS — Z974 Presence of external hearing-aid: Secondary | ICD-10-CM | POA: Diagnosis not present

## 2021-08-25 ENCOUNTER — Encounter: Payer: Self-pay | Admitting: Pulmonary Disease

## 2021-08-27 NOTE — Telephone Encounter (Signed)
Agree to stay safe due to the increase in number of flu and covid cases. Advise to wear a mask if you need to attend a gathering

## 2021-08-29 NOTE — Telephone Encounter (Signed)
Pt ready for scheduling on or after 08/29/21  Out-of-pocket cost due at time of visit: $0.00  Primary: [employer] Evenity co-insurance: 0% Admin fee co-insurance: 0%  Secondary: n/a Evenity co-insurance:  Admin fee co-insurance:   Deductible: does not apply  Prior Auth: APPROVED PA# [id number] Valid: 12/11/20-12/11/21    ** This summary of benefits is an estimation of the patient's out-of-pocket cost. Exact cost may vary based on individual plan coverage.

## 2021-09-01 ENCOUNTER — Encounter: Payer: Self-pay | Admitting: *Deleted

## 2021-09-01 ENCOUNTER — Ambulatory Visit (INDEPENDENT_AMBULATORY_CARE_PROVIDER_SITE_OTHER): Payer: Medicare PPO | Admitting: *Deleted

## 2021-09-01 DIAGNOSIS — M8000XD Age-related osteoporosis with current pathological fracture, unspecified site, subsequent encounter for fracture with routine healing: Secondary | ICD-10-CM | POA: Diagnosis not present

## 2021-09-01 NOTE — Progress Notes (Signed)
Patient here for nurse visit for Evenity. Patient received SQ injections in bilateral arms. Patient tolerated injections well. She will return in 1 month for her next Evenity. °

## 2021-09-29 NOTE — Telephone Encounter (Addendum)
Evenity injection schedule:  Inj #1 - 04/14/21 Inj #2 - 05/19/21 Inj #3 - 06/23/21 Inj #4 - 07/28/21 Inj #5 - 09/01/21  Inj #6 - 10/08/21 Inj #7 - scheduled 11/06/21 renew prior auth expires 12/11/21 Inj #8 -  Inj #9 -  Inj #10 -  Inj #11 -  Inj #12 -

## 2021-10-05 IMAGING — MG DIGITAL SCREENING BILAT W/ TOMO W/ CAD
8 series · 8 of 24 positions shown · non-contrast
Comparison: Previous exam(s).

CLINICAL DATA: Screening.

EXAM:
DIGITAL SCREENING BILATERAL MAMMOGRAM WITH TOMO AND CAD

[L CC synth-2D]
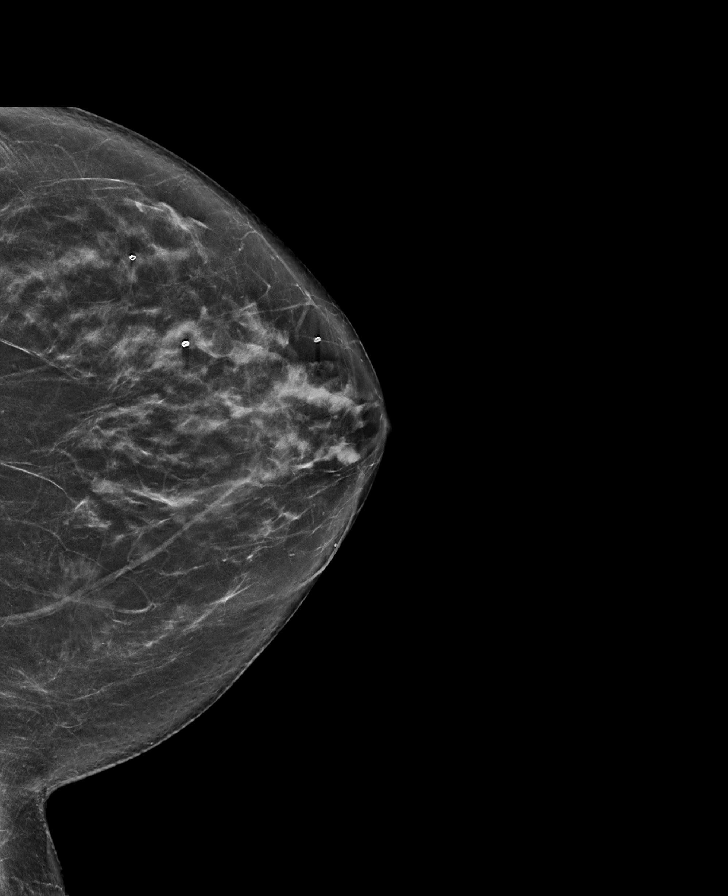

[L MLO synth-2D]
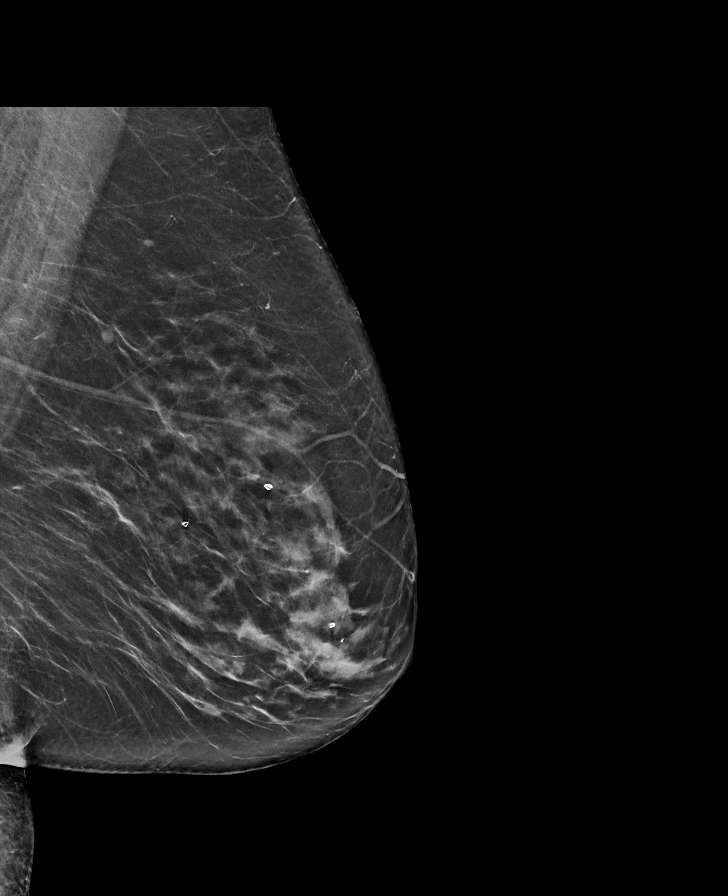

[R MLO synth-2D]
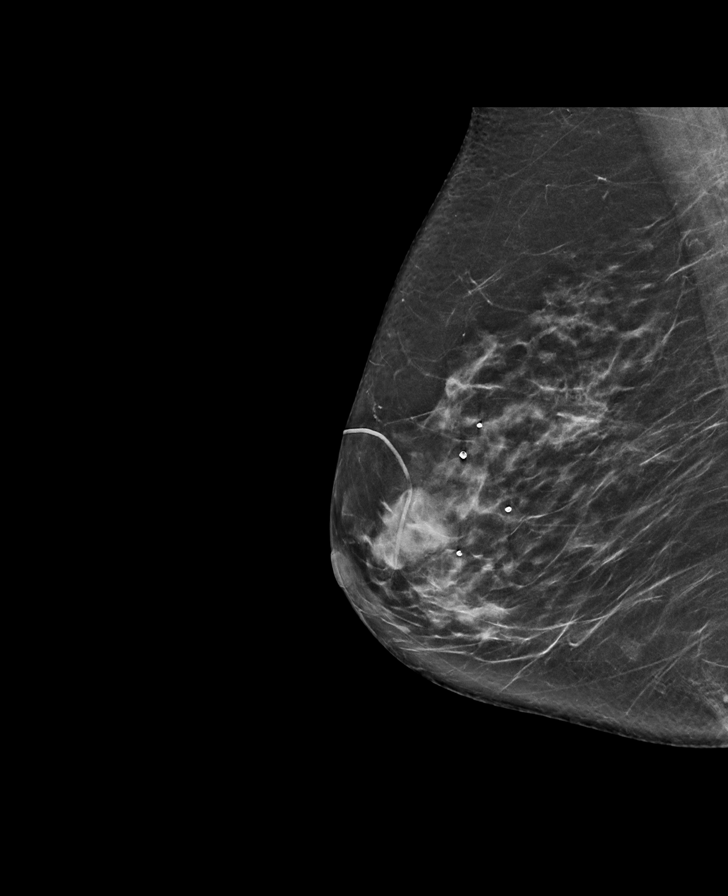

[R CC synth-2D]
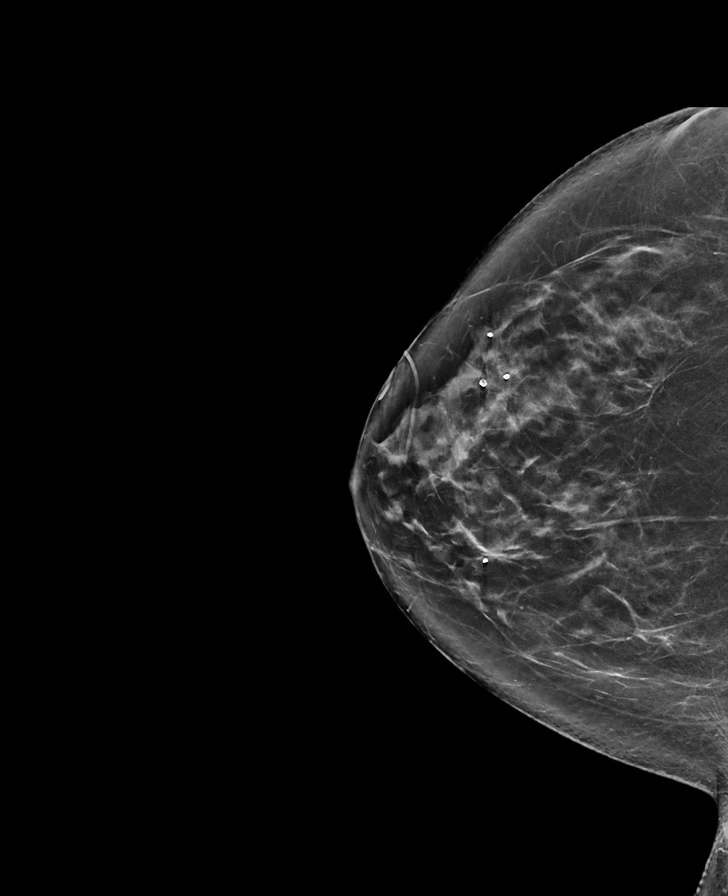

[R MLO tomo · tomo slice 36/71.0]
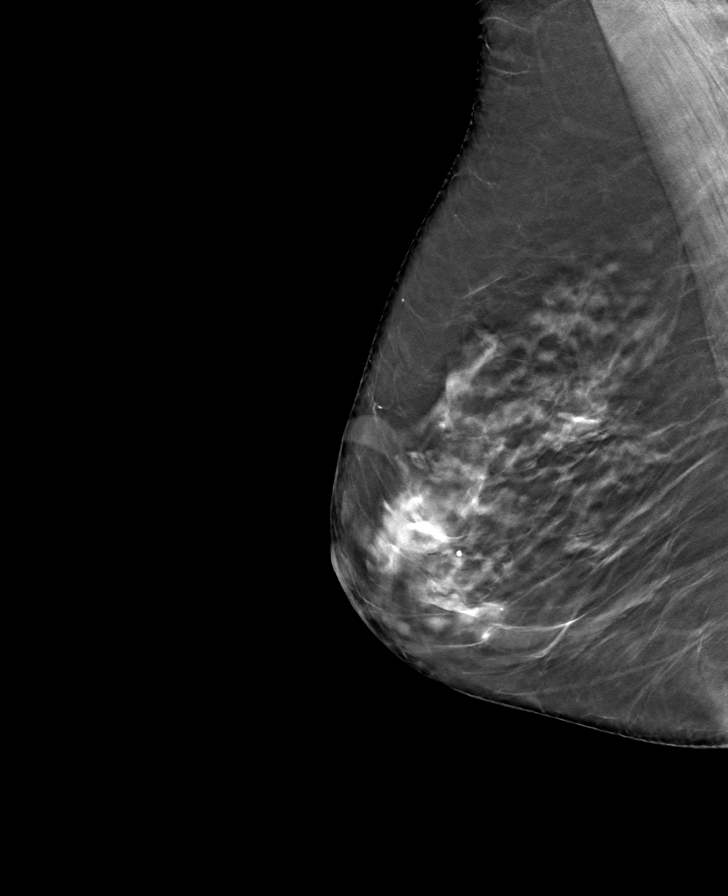

[L MLO tomo · tomo slice 37/73.0]
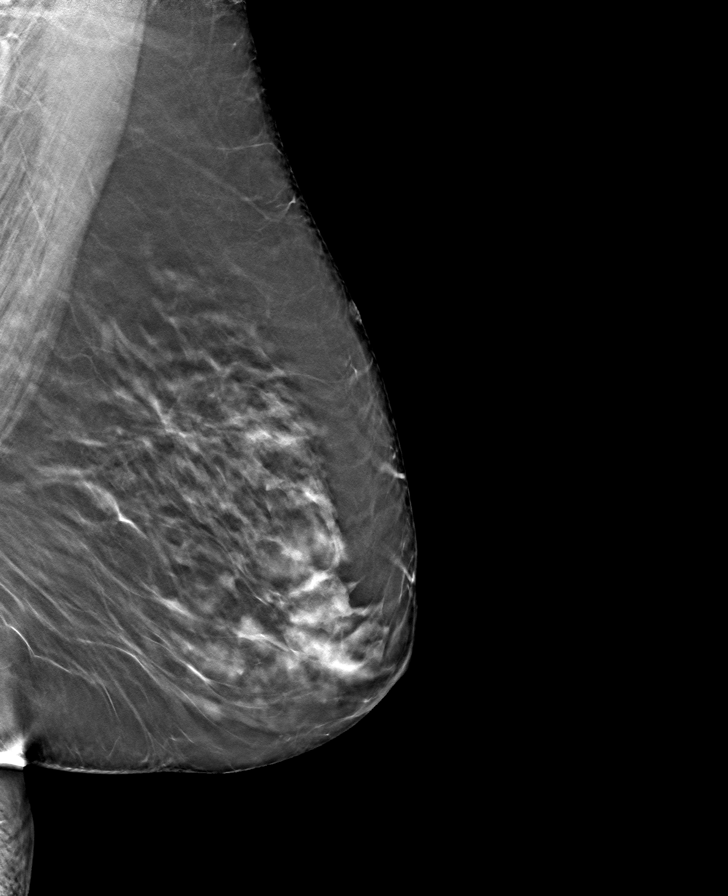

[L CC tomo · tomo slice 34/67.0]
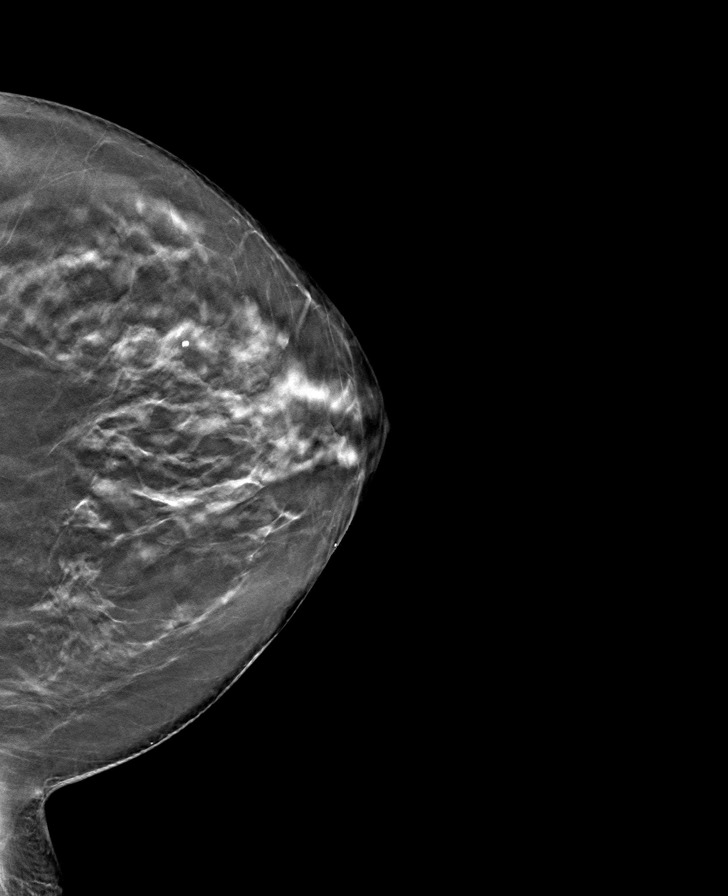

[R CC tomo · tomo slice 39/76.0]
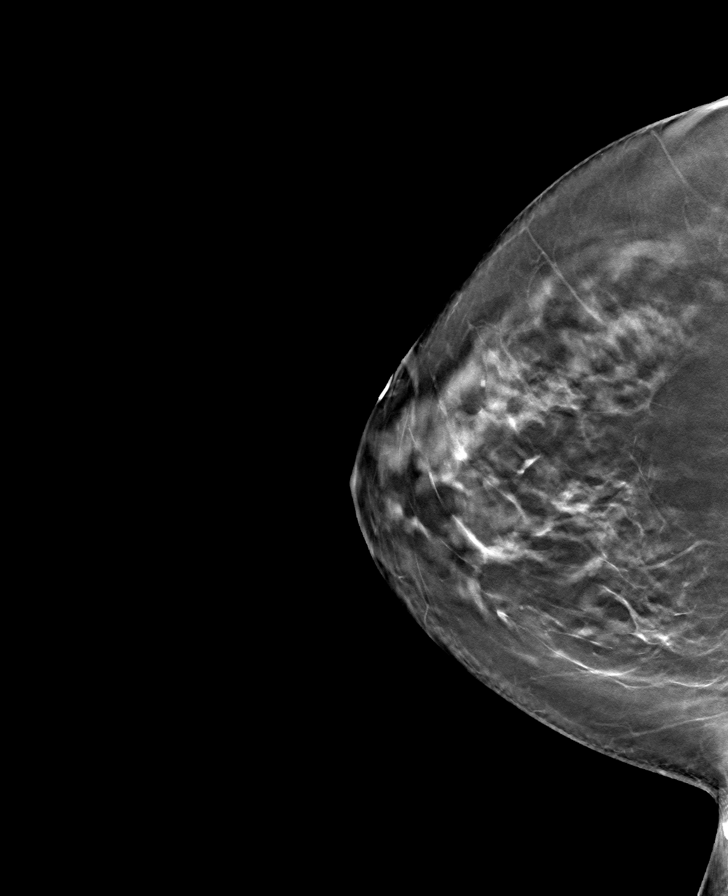

[8 of 24 positions shown; findings below may reference images not displayed]

ACR Breast Density Category c: The breast tissue is heterogeneously
dense, which may obscure small masses.
FINDINGS: There are no findings suspicious for malignancy. Images were
processed with CAD.
IMPRESSION: No mammographic evidence of malignancy. A result letter of this
screening mammogram will be mailed directly to the patient.

RECOMMENDATION:
Screening mammogram in one year. (Code:[id number])

BI-RADS CATEGORY  1: Negative.

## 2021-10-06 ENCOUNTER — Ambulatory Visit: Payer: Medicare PPO

## 2021-10-08 ENCOUNTER — Ambulatory Visit (INDEPENDENT_AMBULATORY_CARE_PROVIDER_SITE_OTHER): Payer: Medicare PPO | Admitting: *Deleted

## 2021-10-08 DIAGNOSIS — M8000XD Age-related osteoporosis with current pathological fracture, unspecified site, subsequent encounter for fracture with routine healing: Secondary | ICD-10-CM

## 2021-10-08 NOTE — Progress Notes (Signed)
Patient here for nurse visit for Evenity. Patient received SQ injections in bilateral arms. Patient tolerated injections well. She will return in 1 month for her next Evenity. °

## 2021-10-15 ENCOUNTER — Other Ambulatory Visit: Payer: Self-pay

## 2021-10-15 ENCOUNTER — Ambulatory Visit: Payer: Medicare PPO | Admitting: Pulmonary Disease

## 2021-10-15 ENCOUNTER — Encounter: Payer: Self-pay | Admitting: Pulmonary Disease

## 2021-10-15 VITALS — BP 122/60 | HR 90 | Temp 98.1°F | Ht 65.0 in | Wt 168.9 lb

## 2021-10-15 DIAGNOSIS — J4531 Mild persistent asthma with (acute) exacerbation: Secondary | ICD-10-CM

## 2021-10-15 DIAGNOSIS — E8801 Alpha-1-antitrypsin deficiency: Secondary | ICD-10-CM

## 2021-10-15 MED ORDER — PREDNISONE 20 MG PO TABS: ORAL_TABLET | ORAL | 0 refills | Status: DC

## 2021-10-15 MED ORDER — BUDESONIDE-FORMOTEROL FUMARATE 160-4.5 MCG/ACT IN AERO: 2.0000 | INHALATION_SPRAY | Freq: Two times a day (BID) | RESPIRATORY_TRACT | 5 refills | Status: DC

## 2021-10-15 MED ORDER — AZITHROMYCIN 250 MG PO TABS: ORAL_TABLET | ORAL | 0 refills | Status: DC

## 2021-10-15 NOTE — Progress Notes (Signed)
Kathleen Hammond    [id number]    [birth year]  Primary Care Physician:Hall, Kathleene Hazel, MD  Referring Physician: Benita Stabile, MD [address] [name],  [place] [address]   Chief complaint: Follow-up for alpha-1 antitrypsin deficiency  HPI: 66 year old with history of heterozygous alpha-1 antitrypsin carrier, asthma.  Diagnosed over 10 years ago.    Has been asymptomatic with no inhaler therapy.  She was also evaluated at [facility] and advised that she did not need replacement therapy.  Has history of asthma which appears to be mild.  Previously on Symbicort but currently on albuterol with good control of symptoms  In November 2021 she required prednisone for 5 days after she developed a flareup of asthma after Moderna booster  Pets: No pets Occupation: Retired school principal Exposures: No known exposures.  No mold, hot tub, Jacuzzi Smoking history: Smoker Travel history: Significant travel history Relevant family history: No significant family history of lung disease.  Interim history: Doing well off controller medication but notes some chest heaviness and sinus congestion with wheezing for the past week Denies any cough, sputum production, fevers, chills  Outpatient Encounter Medications as of 10/15/2021  Medication Sig   albuterol (VENTOLIN HFA) 108 (90 Base) MCG/ACT inhaler Inhale 2 puffs into the lungs every 6 (six) hours as needed (for asthma).   azithromycin (ZITHROMAX Z-PAK) 250 MG tablet Take 2 tabs first day and 1 tab daily until completed   azithromycin (ZITHROMAX) 250 MG tablet Take as directed   budesonide-formoterol (SYMBICORT) 160-4.5 MCG/ACT inhaler Inhale 2 puffs into the lungs 2 (two) times daily.   ergocalciferol (VITAMIN D2) 1.25 MG (50000 UT) capsule Take 50,000 Units by mouth once a week.    PANTOPRAZOLE SODIUM PO Take 40 mg by mouth daily.   predniSONE (DELTASONE) 20 MG tablet Take 2 tablets (40 mg total) by mouth daily with breakfast.   predniSONE  (DELTASONE) 20 MG tablet Take 40mg  for 5 days   rosuvastatin (CRESTOR) 5 MG tablet    SINGULAIR 10 MG tablet Take 1 tablet by mouth at bedtime.    No facility-administered encounter medications on file as of 10/15/2021.    Physical Exam: Blood pressure 122/60, pulse 90, temperature 98.1 F (36.7 C), temperature source Oral, height 5\' 5"  (1.651 m), weight 168 lb 14.4 oz (76.6 kg), SpO2 99 %. Gen:      No acute distress HEENT:  EOMI, sclera anicteric Neck:     No masses; no thyromegaly Lungs:    Clear to auscultation bilaterally; normal respiratory effort CV:         Regular rate and rhythm; no murmurs Abd:      + bowel sounds; soft, non-tender; no palpable masses, no distension Ext:    No edema; adequate peripheral perfusion Skin:      Warm and dry; no rash Neuro: alert and oriented x 3 Psych: normal mood and affect   Data Reviewed: Imaging: Chest x-ray 04/06/2012-no acute cardiopulmonary abnormality CT abdomen pelvis 05/22/13-visualized lung bases are normal. Chest x-ray 06/14/2019-no acute cardiopulmonary abnormality I have reviewed the images personally.  PFTs: 08/03/2018 FVC 3.29 [95%], FEV1 2.45 [92%], F/F 75, TLC 5.16 [96%], DLCO 14.58 [54%) No obstruction or restriction.  Moderate diffusion impairment  10/15/2020 FVC 3.11 [92%], FEV1 2.40 [93%], F/F 77, TLC 4.75 [88%], DLCO 17.71 [4%] Normal test   Labs: Alpha-1 antitrypsin 06/14/2019-72, PI MZ Hepatic panel 06/14/2019-within normal limits  CBC 06/13/2020- WBC 4.6, eos 8.8%, absolute eosinophil  count 405 CBC 10/15/2020-WBC 11.6, eos 0.6%, absolute eosinophil count 70  Assessment:  Mild asthma She is usually not on controller medication but appears to be having a mild flareup today with chest tightness, wheezing   She will start using the Symbicort that she already has.  She will use 2 puffs twice daily for the next 2 weeks Call in a prescription for prednisone and Z-Pak to be held in reserve in case her symptoms  worsen  Alpha-1 antitrypsin carrier status Alpha-1 levels were low at last assessment.  No obstruction on PFTs Repeat PFTs in 1 year  Plan/Recommendations: - Resume Symbicort - Follow-up PFTs  Chilton Greathouse MD [facility] 06/25/2020, 8:47 AM

## 2021-10-15 NOTE — Patient Instructions (Signed)
I am glad you are stable with regard to breathing We will renew the Symbicort We will send in a Z-Pak and prednisone 40 mg a day for 5 days in case you need it for an exacerbation Get PFTs in 1 year and follow-up in 1 year

## 2021-10-28 DIAGNOSIS — H900 Conductive hearing loss, bilateral: Secondary | ICD-10-CM | POA: Diagnosis not present

## 2021-11-06 ENCOUNTER — Ambulatory Visit: Payer: Medicare PPO

## 2021-11-13 ENCOUNTER — Ambulatory Visit: Payer: Medicare PPO | Admitting: Family Medicine

## 2021-11-13 DIAGNOSIS — M8000XD Age-related osteoporosis with current pathological fracture, unspecified site, subsequent encounter for fracture with routine healing: Secondary | ICD-10-CM

## 2021-11-13 NOTE — Progress Notes (Signed)
Patient here for Evenity. This is her 7th Evenity injection. Patient received SQ injections in bilateral arms. Patient tolerated injections well. She will return in 1 month for her next Evenity. ?

## 2021-11-13 NOTE — Telephone Encounter (Signed)
Prior auth renewal initiated via [email or web address] ?KEYHarolyn Rutherford - PA Case ID: [id number] ? ? ? ? ?

## 2021-11-13 NOTE — Progress Notes (Signed)
?  Kathleen Hammond - 66 y.o. female MRN [id number]  Date of birth: [birth year] ? ?SUBJECTIVE:  Including CC & ROS.  ?No chief complaint on file. ? ? ?Kathleen Hammond is a 66 y.o. female that is following up for her osteoporosis.  She has been doing well with the Evenity.  She continues to feel an imbalance and unsteady on her feet at times.  She does walk but she does not do any form of strength training. ? ? ?Review of Systems ?See HPI  ? ?HISTORY: Past Medical, Surgical, Social, and Family History Reviewed & Updated per EMR.   ?Pertinent Historical Findings include: ? ?Past Medical History:  ?Diagnosis Date  ? Alpha-1-antitrypsin deficiency (HCC)   ? Asthma   ? History of anemia   ? Perimenopausal vasomotor symptoms   ? Postmenopausal   ? Urinary incontinence   ? ? ?Past Surgical History:  ?Procedure Laterality Date  ? BREAST EXCISIONAL BIOPSY Right 1996  ? CATARACT EXTRACTION Right 05/2014  ? [facility]  ? CATARACT EXTRACTION Left 05/2014  ? [facility]  ? CESAREAN SECTION    ? CHOLECYSTECTOMY    ? cyst removed from the gum    ? DILATION AND CURETTAGE OF UTERUS    ? GALLBLADDER SURGERY    ? lump removed from the breast    ? TUBAL LIGATION    ? YAG LASER APPLICATION Left 2016  ? [facility]  ? YAG LASER APPLICATION Right 2016  ? [facility]  ? ? ? ?PHYSICAL EXAM:  ?VS: BP 120/80 (BP Location: Right Arm, Patient Position: Sitting)   Ht 5\' 5"  (1.651 m)   Wt 168 lb (76.2 kg)   BMI 27.96 kg/m?  ?Physical Exam ?Gen: NAD, alert, cooperative with exam, well-appearing ?MSK:  ?Neurovascularly intact   ? ? ? ? ?ASSESSMENT & PLAN:  ? ?Osteoporosis ?Acute on chronic in nature.  Has done well with the anabolic treatment of Evenity.  She feels unsteady on her feet and wants to get back to walking more. ?-Counseled on home exercise therapy and supportive care. ?-Referral to physical therapy. ? ? ? ? ?

## 2021-11-13 NOTE — Assessment & Plan Note (Signed)
Acute on chronic in nature.  Has done well with the anabolic treatment of Evenity.  She feels unsteady on her feet and wants to get back to walking more. ?-Counseled on home exercise therapy and supportive care. ?-Referral to physical therapy. ? ?

## 2021-11-14 NOTE — Telephone Encounter (Signed)
Received fax from [facility] stating Evenity PA is approved 12/14/21 - 12/15/22.  ?Auth #: [id number] ?

## 2021-11-20 DIAGNOSIS — E782 Mixed hyperlipidemia: Secondary | ICD-10-CM | POA: Diagnosis not present

## 2021-11-24 DIAGNOSIS — E8801 Alpha-1-antitrypsin deficiency: Secondary | ICD-10-CM | POA: Diagnosis not present

## 2021-11-24 DIAGNOSIS — E782 Mixed hyperlipidemia: Secondary | ICD-10-CM | POA: Diagnosis not present

## 2021-11-24 DIAGNOSIS — M75101 Unspecified rotator cuff tear or rupture of right shoulder, not specified as traumatic: Secondary | ICD-10-CM | POA: Diagnosis not present

## 2021-11-24 DIAGNOSIS — M810 Age-related osteoporosis without current pathological fracture: Secondary | ICD-10-CM | POA: Diagnosis not present

## 2021-11-24 DIAGNOSIS — E559 Vitamin D deficiency, unspecified: Secondary | ICD-10-CM | POA: Diagnosis not present

## 2021-11-24 DIAGNOSIS — K219 Gastro-esophageal reflux disease without esophagitis: Secondary | ICD-10-CM | POA: Diagnosis not present

## 2021-11-24 DIAGNOSIS — H43811 Vitreous degeneration, right eye: Secondary | ICD-10-CM | POA: Diagnosis not present

## 2021-11-24 DIAGNOSIS — M722 Plantar fascial fibromatosis: Secondary | ICD-10-CM | POA: Diagnosis not present

## 2021-11-24 DIAGNOSIS — K589 Irritable bowel syndrome without diarrhea: Secondary | ICD-10-CM | POA: Diagnosis not present

## 2021-11-27 ENCOUNTER — Ambulatory Visit (HOSPITAL_COMMUNITY): Payer: Medicare PPO | Attending: Family Medicine | Admitting: Physical Therapy

## 2021-11-27 ENCOUNTER — Encounter (HOSPITAL_COMMUNITY): Payer: Self-pay | Admitting: Physical Therapy

## 2021-11-27 DIAGNOSIS — R29898 Other symptoms and signs involving the musculoskeletal system: Secondary | ICD-10-CM | POA: Insufficient documentation

## 2021-11-27 DIAGNOSIS — R2689 Other abnormalities of gait and mobility: Secondary | ICD-10-CM | POA: Insufficient documentation

## 2021-11-27 DIAGNOSIS — M8000XD Age-related osteoporosis with current pathological fracture, unspecified site, subsequent encounter for fracture with routine healing: Secondary | ICD-10-CM | POA: Diagnosis not present

## 2021-11-27 DIAGNOSIS — M5459 Other low back pain: Secondary | ICD-10-CM | POA: Diagnosis not present

## 2021-11-27 NOTE — Therapy (Addendum)
?OUTPATIENT PHYSICAL THERAPY THORACOLUMBAR EVALUATION ? ? ?Patient Name: Kathleen Hammond ?MRN: [id number] ?DOB:04-24-1956, 66 y.o., female ?Today's Date: 11/27/2021 ? ? PT End of Session - 11/27/21 0740   ? ? Visit Number 1   ? Number of Visits 8   ? Date for PT Re-Evaluation 01/22/22   ? Authorization Type [employer]   ? Authorization Time Period 8 visits requested - check auth   ? PT Start Time 0745   ? PT Stop Time 0825   ? PT Time Calculation (min) 40 min   ? Activity Tolerance Patient tolerated treatment well   ? Behavior During Therapy [facility] for tasks assessed/performed   ? ?  ?  ? ?  ? ? ?Past Medical History:  ?Diagnosis Date  ? Alpha-1-antitrypsin deficiency ([place])   ? Asthma   ? History of anemia   ? Perimenopausal vasomotor symptoms   ? Postmenopausal   ? Urinary incontinence   ? ?Past Surgical History:  ?Procedure Laterality Date  ? BREAST EXCISIONAL BIOPSY Right 1996  ? CATARACT EXTRACTION Right 05/2014  ? [facility]  ? CATARACT EXTRACTION Left 05/2014  ? [facility]  ? CESAREAN SECTION    ? CHOLECYSTECTOMY    ? cyst removed from the gum    ? DILATION AND CURETTAGE OF UTERUS    ? GALLBLADDER SURGERY    ? lump removed from the breast    ? TUBAL LIGATION    ? YAG LASER APPLICATION Left [id number]  ? [facility]  ? YAG LASER APPLICATION Right [id number]  ? [facility]  ? ?Patient Active Problem List  ? Diagnosis Date Noted  ? Posterior vitreous detachment of right eye 03/28/2020  ? Intermediate stage nonexudative age-related macular degeneration of both eyes 03/28/2020  ? Retinal detachment of left eye with retinal break 03/05/2020  ? Osteoporosis 08/28/2019  ? Mixed hyperlipidemia 03/16/2011  ? Sinus tachycardia 03/16/2011  ? ? ?PCP: Celene Squibb, MD ? ?REFERRING PROVIDER: Rosemarie Ax, MD ? ?REFERRING DIAG: M80.00XD (ICD-10-CM) - Age-related osteoporosis with current pathological fracture with routine healing, subsequent encounter  ? ?THERAPY DIAG:  ?Other low back pain - Plan: PT plan of care cert/re-cert ? ?Other abnormalities of gait  and mobility - Plan: PT plan of care cert/re-cert ? ?Other symptoms and signs involving the musculoskeletal system - Plan: PT plan of care cert/re-cert ? ?ONSET DATE: about 2018 ? ?SUBJECTIVE:                                                                                                                                                                                          ? ?SUBJECTIVE STATEMENT: ?States osteoporosis is  in her family. She wants to do whatever she can to not end up like family who have issues. Is not very coordinated. She is on medication to rebuild bone. Is not currently exercising. No back fractures. Osteoporosis is in the spine and hips.   ? ?PERTINENT HISTORY:  ?Osteoporosis, unsteadiness ? ?PAIN:  ?Are you having pain? No NPRS scale: 0/10 ?Pain location:   ?Pain description:   ?Aggravating factors:   ?Relieving factors:   ? ? ?PRECAUTIONS: None ? ?WEIGHT BEARING RESTRICTIONS No ? ?FALLS:  ?Has patient fallen in last 6 months? No ? ?LIVING ENVIRONMENT: ?Lives with: lives with their spouse ?Lives in: House/apartment ?Stairs: stairs to basement: 12-15 steps, rail both sides ?Has following equipment at home: None ? ?OCCUPATION: Retired, Writer ? ?PLOF: Independent ? ?PATIENT GOALS build up strength and balance to avoid any issues ? ? ?OBJECTIVE:  ? ? ?SCREENING FOR RED FLAGS: ?Bowel or bladder incontinence: No ?Spinal tumors: No ?Cauda equina syndrome: No ?Compression fracture: No ?Abdominal aneurysm: No ? ?COGNITION: ? Overall cognitive status: Within functional limits for tasks assessed   ?  ?SENSATION: ?WFL ? ?POSTURE:  ?Slight forward head and slouched in seated ? ?PALPATION: ?NT ? ?LUMBAR ROM:  ? ?Active  A/PROM  ?11/27/2021  ?Flexion 0% limited  ?Extension 50% limited  ?Right lateral flexion 0% limited  ?Left lateral flexion 0% limited  ?Right rotation 25% limited  ?Left rotation 25% limited  ? (Blank rows = not tested) ? ?LE ROM: WFL ? ?Active  Right ?11/27/2021 Left ?11/27/2021  ?Hip  flexion    ?Hip extension    ?Hip abduction    ?Hip adduction    ?Hip internal rotation    ?Hip external rotation    ?Knee flexion    ?Knee extension    ?Ankle dorsiflexion    ?Ankle plantarflexion    ?Ankle inversion    ?Ankle eversion    ? (Blank rows = not tested) ? ?LE MMT: ? ?MMT Right ?11/27/2021 Left ?11/27/2021  ?Hip flexion 3+/5 3+/5  ?Hip extension    ?Hip abduction    ?Hip adduction    ?Hip internal rotation    ?Hip external rotation    ?Knee flexion 4+/5 4/5  ?Knee extension 4+/5 4+/5  ?Ankle dorsiflexion 4/5 4/5  ?Ankle plantarflexion    ?Ankle inversion    ?Ankle eversion    ? (Blank rows = not tested) ? ?UPPER EXTREMITY ROM: WFL ? ?Active ROM Right ?11/27/2021 Left ?11/27/2021  ?Shoulder flexion    ?Shoulder extension    ?Shoulder abduction    ?Shoulder adduction    ?Shoulder internal rotation    ?Shoulder external rotation    ?Elbow flexion    ?Elbow extension    ?Wrist flexion    ?Wrist extension    ?Wrist ulnar deviation    ?Wrist radial deviation    ?Wrist pronation    ?Wrist supination    ?(Blank rows = not tested) ?*= pain ? ?UPPER EXTREMITY MMT: ? ?MMT Right ?11/27/2021 Left ?11/27/2021  ?Shoulder flexion 4-/5 4-/5  ?Shoulder extension    ?Shoulder abduction 4-/5 4-/5  ?Shoulder adduction    ?Shoulder internal rotation    ?Shoulder external rotation    ?Middle trapezius    ?Lower trapezius    ?Elbow flexion 4-/5 4-/5  ?Elbow extension 4-/5 4-/5  ?Wrist flexion    ?Wrist extension    ?Wrist ulnar deviation    ?Wrist radial deviation    ?Wrist pronation    ?Wrist supination    ?  Grip strength (lbs)    ?(Blank rows = not tested) ?*= pain ? ? ? ? ?FUNCTIONAL TESTS:  ?30 seconds chair stand test: 9 reps without UE support, dynamic knee valgus bilaterally ?2 minute walk test: 375 feet without AD ?Stairs: 7 inch steps, alternating pattern with UE support ? ?GAIT: ?Distance walked: 375 feet ?Assistive device utilized: None ?Level of assistance: Complete Independence ?Comments: 2MWT, decreased hip extension on L,  decreased R hip abduction strength with slight hip drop ? ? ? ?TODAY'S TREATMENT  ?11/27/21 ?STS 2x 10  ?Standing hip abduction 2x 10 bilateral  ? ? ?PATIENT EDUCATION:  ?PATIENT EDUCATION:  ?Education details: Patient educated on exam findings, POC, scope of PT, HEP, osteoporosis, importance of weightbearing and strength training, and beginning a walking program. ?Person educated: Patient ?Education method: Explanation, Demonstration, and Handouts ?Education comprehension: verbalized understanding, returned demonstration, verbal cues required, and tactile cues required ? ? ?HOME EXERCISE PROGRAM: ?Walking program, STS, standing hip abduction ? ?ASSESSMENT: ? ?CLINICAL IMPRESSION: ?Patient a 66 y.o. y.o. female who was seen today for physical therapy evaluation and treatment for osteoporosis, strength, and balance.  Patient will continue to benefit from physical therapy in order to improve function and reduce impairment. ?  ? ? ?OBJECTIVE IMPAIRMENTS Abnormal gait, decreased activity tolerance, decreased balance, decreased endurance, decreased mobility, difficulty walking, decreased strength, impaired perceived functional ability, improper body mechanics, and postural dysfunction.  ? ?ACTIVITY LIMITATIONS cleaning, community activity, meal prep, laundry, yard work, and shopping.  ? ?PERSONAL FACTORS Fitness, Sex, Time since onset of injury/illness/exacerbation, and 1 comorbidity: osteoporosis  are also affecting patient's functional outcome.  ? ? ?REHAB POTENTIAL: Good ? ?CLINICAL DECISION MAKING: Stable/uncomplicated ? ?EVALUATION COMPLEXITY: Low ? ? ?GOALS: ?Goals reviewed with patient? No ? ?SHORT TERM GOALS: Target date: 12/25/2021 ? ?Patient will be independent with HEP in order to improve functional outcomes. ?Baseline:  ?Goal status: INITIAL ? ?2.  Patient will report at least 25% improvement in symptoms for improved quality of life. ?Baseline:  ?Goal status: INITIAL ? ?3.  Patient will be able to perform 12 STS  in 30 seconds without dynamic knee valgus to demonstrate improved LE strength and power. ?Baseline:  ?Goal status: INITIAL ? ? ? ?LONG TERM GOALS: Target date: 01/22/2022 ? ?Patient will report at least 75%

## 2021-11-27 NOTE — Patient Instructions (Signed)
Access Code: [email or web address] ?URL: [email or web address] ?Date: 11/27/2021 ?Prepared by: Greig Castilla Debbie Bellucci ? ?Exercises ?- Sit to Stand with Arms Crossed  - 1 x daily - 7 x weekly - 3 sets - 10 reps ?- Standing Hip Abduction with Counter Support  - 1 x daily - 7 x weekly - 3 sets - 10 reps ?

## 2021-12-04 ENCOUNTER — Ambulatory Visit (HOSPITAL_COMMUNITY): Payer: Medicare PPO | Admitting: Physical Therapy

## 2021-12-04 ENCOUNTER — Telehealth (HOSPITAL_COMMUNITY): Payer: Self-pay | Admitting: Physical Therapy

## 2021-12-10 ENCOUNTER — Encounter (HOSPITAL_COMMUNITY): Payer: Self-pay

## 2021-12-10 ENCOUNTER — Ambulatory Visit (HOSPITAL_COMMUNITY): Payer: Medicare PPO

## 2021-12-10 DIAGNOSIS — R29898 Other symptoms and signs involving the musculoskeletal system: Secondary | ICD-10-CM

## 2021-12-10 DIAGNOSIS — R2689 Other abnormalities of gait and mobility: Secondary | ICD-10-CM | POA: Diagnosis not present

## 2021-12-10 DIAGNOSIS — M8000XD Age-related osteoporosis with current pathological fracture, unspecified site, subsequent encounter for fracture with routine healing: Secondary | ICD-10-CM | POA: Diagnosis not present

## 2021-12-10 DIAGNOSIS — M5459 Other low back pain: Secondary | ICD-10-CM

## 2021-12-10 NOTE — Therapy (Signed)
?OUTPATIENT PHYSICAL THERAPY TREATMENT NOTE ? ? ?Patient Name: Kathleen Hammond ?MRN: [id number] ?DOB:02/01/1956, 66 y.o., female ?Today's Date: 12/10/2021 ? ?PCP: Benita Stabile, MD ?REFERRING PROVIDER: Benita Stabile, MD ? ?END OF SESSION:  ? PT End of Session - 12/10/21 1707   ? ? Visit Number 2   ? Number of Visits 8   ? Date for PT Re-Evaluation 01/22/22   ? Authorization Type [employer]   ? Authorization Time Period 8 visits approved 04/06-->01/22/22   ? PT Start Time 1604   ? PT Stop Time 1642   ? PT Time Calculation (min) 38 min   ? Activity Tolerance Patient tolerated treatment well   ? Behavior During Therapy [facility] for tasks assessed/performed   ? ?  ?  ? ?  ? ? ?Past Medical History:  ?Diagnosis Date  ? Alpha-1-antitrypsin deficiency ([facility])   ? Asthma   ? History of anemia   ? Perimenopausal vasomotor symptoms   ? Postmenopausal   ? Urinary incontinence   ? ?Past Surgical History:  ?Procedure Laterality Date  ? BREAST EXCISIONAL BIOPSY Right 1996  ? CATARACT EXTRACTION Right 05/2014  ? [facility]  ? CATARACT EXTRACTION Left 05/2014  ? [facility]  ? CESAREAN SECTION    ? CHOLECYSTECTOMY    ? cyst removed from the gum    ? DILATION AND CURETTAGE OF UTERUS    ? GALLBLADDER SURGERY    ? lump removed from the breast    ? TUBAL LIGATION    ? YAG LASER APPLICATION Left 2016  ? [facility]  ? YAG LASER APPLICATION Right 2016  ? [facility]  ? ?Patient Active Problem List  ? Diagnosis Date Noted  ? Posterior vitreous detachment of right eye 03/28/2020  ? Intermediate stage nonexudative age-related macular degeneration of both eyes 03/28/2020  ? Retinal detachment of left eye with retinal break 03/05/2020  ? Osteoporosis 08/28/2019  ? Mixed hyperlipidemia 03/16/2011  ? Sinus tachycardia 03/16/2011  ? ? ?REFERRING DIAG: M80.00XD (ICD-10-CM) - Age-related osteoporosis with current pathological fracture with routine healing, subsequent encounter  ? ?THERAPY DIAG:  ?Other low back pain ? ?Other abnormalities of gait and mobility ? ?Other symptoms and  signs involving the musculoskeletal system ? ?PERTINENT HISTORY: Osteoporosis, unsteadiness ? ?PRECAUTIONS: None ? ?SUBJECTIVE: Feeling good today, no reports of pain.  Admits to not completing HEP, feels difficult to get motivated. ? ?PAIN:  ?Are you having pain? No ? ?  ?OBJECTIVE:  ?POSTURE:  ?Slight forward head and slouched in seated ?  ?LUMBAR ROM:  ?  ?Active  A/PROM  ?11/27/2021  ?Flexion 0% limited  ?Extension 50% limited  ?Right lateral flexion 0% limited  ?Left lateral flexion 0% limited  ?Right rotation 25% limited  ?Left rotation 25% limited  ? (Blank rows = not tested) ?  ?LE ROM: WFL ?  ?Active  Right ?11/27/2021 Left ?11/27/2021  ?Hip flexion      ?Hip extension      ?Hip abduction      ?Hip adduction      ?Hip internal rotation      ?Hip external rotation      ?Knee flexion      ?Knee extension      ?Ankle dorsiflexion      ?Ankle plantarflexion      ?Ankle inversion      ?Ankle eversion      ? (Blank rows = not tested) ?  ?LE MMT: ?  ?MMT Right ?11/27/2021 Left ?11/27/2021  ?Hip  flexion 3+/5 3+/5  ?Hip extension      ?Hip abduction      ?Hip adduction      ?Hip internal rotation      ?Hip external rotation      ?Knee flexion 4+/5 4/5  ?Knee extension 4+/5 4+/5  ?Ankle dorsiflexion 4/5 4/5  ?Ankle plantarflexion      ?Ankle inversion      ?Ankle eversion      ? (Blank rows = not tested) ?  ?UPPER EXTREMITY ROM: WFL ?  ?Active ROM Right ?11/27/2021 Left ?11/27/2021  ?Shoulder flexion      ?Shoulder extension      ?Shoulder abduction      ?Shoulder adduction      ?Shoulder internal rotation      ?Shoulder external rotation      ?Elbow flexion      ?Elbow extension      ?Wrist flexion      ?Wrist extension      ?Wrist ulnar deviation      ?Wrist radial deviation      ?Wrist pronation      ?Wrist supination      ?(Blank rows = not tested) ?*= pain ?  ?UPPER EXTREMITY MMT: ?  ?MMT Right ?11/27/2021 Left ?11/27/2021  ?Shoulder flexion 4-/5 4-/5  ?Shoulder extension      ?Shoulder abduction 4-/5 4-/5  ?Shoulder adduction       ?Shoulder internal rotation      ?Shoulder external rotation      ?Middle trapezius      ?Lower trapezius      ?Elbow flexion 4-/5 4-/5  ?Elbow extension 4-/5 4-/5  ?Wrist flexion      ?Wrist extension      ?Wrist ulnar deviation      ?Wrist radial deviation      ?Wrist pronation      ?Wrist supination      ?Grip strength (lbs)      ?(Blank rows = not tested) ?*= pain ?  ?  ?  ?  ?FUNCTIONAL TESTS:  ?30 seconds chair stand test: 9 reps without UE support, dynamic knee valgus bilaterally ?2 minute walk test: 375 feet without AD ?Stairs: 7 inch steps, alternating pattern with UE support ?  ?GAIT: ?Distance walked: 375 feet ?Assistive device utilized: None ?Level of assistance: Complete Independence ?Comments: , decreased hip extension on L, decreased R hip abduction strength with slight hip drop ?  ?  ?  ?TODAY'S TREATMENT  ?12/10/21: ? STS 2x 10 2nd set with GTB around thigh to improve knee alignment ?Standing hip abduction 2x 10 bilateral; 2nd set with GTB around thigh ?Hip extension 10x each ?Alternating march 10x wiht1 HHA ?Knee flexion 10x 2#  ?Tandem stance 2x 30" ?Squat 5x front of chair ? ? Yellow grip putty HEP ? ? ?  ?11/27/21 ?STS 2x 10  ?Standing hip abduction 2x 10 bilateral  ?  ?  ?PATIENT EDUCATION:  ?PATIENT EDUCATION:  ?Education details: Patient educated on exam findings, POC, scope of PT, HEP, osteoporosis, importance of weightbearing and strength training, and beginning a walking program. ?Person educated: Patient ?Education method: Explanation, Demonstration, and Handouts ?Education comprehension: verbalized understanding, returned demonstration, verbal cues required, and tactile cues required ?  ?  ?HOME EXERCISE PROGRAM: ?Walking program, STS, standing hip abduction ?4/19: Given GTB to add to current STS and hip abd.  Putty for grip strengthening ?  ?ASSESSMENT: ?  ?CLINICAL IMPRESSION: ?Reviewed goals, educated importance of HEP compliance for maximal  benefits, pt admit she hasn't began  exercises yet- reviewed exercises with good form and min cueing for stability with LE movements.  Added theraband resistance with STS and hip abd with cueing to improve mechanics to reduce knee valgus Rt and to keep upright still.  Pt reports difficulty with grip strength opening bottles, added putty to HEP with handout given.  No additional LE exercises added to HEP, encouraged pt to complete HEP more regularly for maximal benefits.   ?  ?  ?OBJECTIVE IMPAIRMENTS Abnormal gait, decreased activity tolerance, decreased balance, decreased endurance, decreased mobility, difficulty walking, decreased strength, impaired perceived functional ability, improper body mechanics, and postural dysfunction.  ?  ?ACTIVITY LIMITATIONS cleaning, community activity, meal prep, laundry, yard work, and shopping.  ?  ?PERSONAL FACTORS Fitness, Sex, Time since onset of injury/illness/exacerbation, and 1 comorbidity: osteoporosis  are also affecting patient's functional outcome.  ?  ?  ?REHAB POTENTIAL: Good ?  ?CLINICAL DECISION MAKING: Stable/uncomplicated ?  ?EVALUATION COMPLEXITY: Low ?  ?  ?GOALS: ?Goals reviewed with patient? No ?  ?SHORT TERM GOALS: Target date: 12/25/2021 ?  ?Patient will be independent with HEP in order to improve functional outcomes. ?Baseline:  ?Goal status: Ongoing ?  ?2.  Patient will report at least 25% improvement in symptoms for improved quality of life. ?Baseline:  ?Goal status: Ongoing ?  ?3.  Patient will be able to perform 12 STS in 30 seconds without dynamic knee valgus to demonstrate improved LE strength and power. ?Baseline:  ?Goal status: Ongoing ?  ?  ?  ?LONG TERM GOALS: Target date: 01/22/2022 ?  ?Patient will report at least 75% improvement in symptoms for improved quality of life. ?Baseline:  ?Goal status: Ongoing ?  ?2.  Patient will demonstrate at least 4+/5 MMT in all tested muscles to show improving strength for chores. ?Baseline:  ?Goal status: Ongoing ?  ?3.  Patient will continue  walking/exercise program for improved ability to maintain/improve bone density to reduce risk of fracture. ?Baseline:  ?Goal status: Ongoing ?  ?4.  Patient will be able to ambulate at least 450 feet in in

## 2021-12-10 NOTE — Patient Instructions (Signed)
Theraputty Home Exercise Program  Complete 1-2 times a day.  putty squeeze  Pt. should squeeze putty in hand trying to keep it round by rotating putty after each squeeze. push fingers through putty to palm each time. Complete for ______ minutes.   PUTTY KEY GRIP  Hold the putty at the top of your hand. Squeeze the putty between your thumb and the side of your 2nd finger as shown. Complete for ________ minutes.    PUTTY 3 JAW CHUCK  Roll up some putty into a ball then flatten it. Then, firmly squeeze it with your first 3 fingers as shown. Complete for ______ minutes.          

## 2021-12-15 ENCOUNTER — Ambulatory Visit (INDEPENDENT_AMBULATORY_CARE_PROVIDER_SITE_OTHER): Payer: Medicare PPO | Admitting: *Deleted

## 2021-12-15 DIAGNOSIS — M8000XD Age-related osteoporosis with current pathological fracture, unspecified site, subsequent encounter for fracture with routine healing: Secondary | ICD-10-CM

## 2021-12-15 NOTE — Progress Notes (Signed)
Patient here for nurse visit for Evenity. Patient received SQ injections in bilateral arms. Patient tolerated injections well. She will return in 1 month for her next Evenity. °

## 2021-12-16 NOTE — Telephone Encounter (Addendum)
[facility]ity injection schedule:   Inj #1 - 04/14/21 Inj #2 - 05/19/21 Inj #3 - 06/23/21 Inj #4 - 07/28/21 Inj #5 - 09/01/21  Inj #6 - 10/08/21 Inj #7 - 11/13/21 renew prior auth expires 12/11/21 Inj #8 - 12/15/21 Inj #9 -  01/15/22 Inj #10 - 02/25/22 Inj #11 - 03/30/22 Inj #12 - 05/08/22

## 2021-12-17 ENCOUNTER — Encounter (HOSPITAL_COMMUNITY): Payer: Self-pay | Admitting: Physical Therapy

## 2021-12-17 ENCOUNTER — Ambulatory Visit (HOSPITAL_COMMUNITY): Payer: Medicare PPO | Admitting: Physical Therapy

## 2021-12-17 DIAGNOSIS — M5459 Other low back pain: Secondary | ICD-10-CM | POA: Diagnosis not present

## 2021-12-17 DIAGNOSIS — R29898 Other symptoms and signs involving the musculoskeletal system: Secondary | ICD-10-CM

## 2021-12-17 DIAGNOSIS — R2689 Other abnormalities of gait and mobility: Secondary | ICD-10-CM | POA: Diagnosis not present

## 2021-12-17 DIAGNOSIS — M8000XD Age-related osteoporosis with current pathological fracture, unspecified site, subsequent encounter for fracture with routine healing: Secondary | ICD-10-CM | POA: Diagnosis not present

## 2021-12-17 NOTE — Therapy (Signed)
?OUTPATIENT PHYSICAL THERAPY TREATMENT NOTE ? ? ?Patient Name: Kathleen Hammond ?MRN: [id number] ?DOB:1956-06-29, 66 y.o., female ?Today's Date: 12/17/2021 ? ?PCP: Benita Stabile, MD ?REFERRING PROVIDER: Benita Stabile, MD ? ?END OF SESSION:  ? PT End of Session - 12/17/21 0748   ? ? Visit Number 3   ? Number of Visits 8   ? Date for PT Re-Evaluation 01/22/22   ? Authorization Type [employer]   ? Authorization Time Period 8 visits approved 04/06-->01/22/22   ? Authorization - Visit Number 3   ? Authorization - Number of Visits 8   ? PT Start Time [phone]   ? PT Stop Time [phone]   ? PT Time Calculation (min) 38 min   ? Activity Tolerance Patient tolerated treatment well   ? Behavior During Therapy [facility] for tasks assessed/performed   ? ?  ?  ? ?  ? ? ?Past Medical History:  ?Diagnosis Date  ? Alpha-1-antitrypsin deficiency (HCC)   ? Asthma   ? History of anemia   ? Perimenopausal vasomotor symptoms   ? Postmenopausal   ? Urinary incontinence   ? ?Past Surgical History:  ?Procedure Laterality Date  ? BREAST EXCISIONAL BIOPSY Right 1996  ? CATARACT EXTRACTION Right 05/2014  ? [place]  ? CATARACT EXTRACTION Left 05/2014  ? [facility]  ? CESAREAN SECTION    ? CHOLECYSTECTOMY    ? cyst removed from the gum    ? DILATION AND CURETTAGE OF UTERUS    ? GALLBLADDER SURGERY    ? lump removed from the breast    ? TUBAL LIGATION    ? YAG LASER APPLICATION Left 2016  ? [facility]  ? YAG LASER APPLICATION Right 2016  ? [facility]  ? ?Patient Active Problem List  ? Diagnosis Date Noted  ? Posterior vitreous detachment of right eye 03/28/2020  ? Intermediate stage nonexudative age-related macular degeneration of both eyes 03/28/2020  ? Retinal detachment of left eye with retinal break 03/05/2020  ? Osteoporosis 08/28/2019  ? Mixed hyperlipidemia 03/16/2011  ? Sinus tachycardia 03/16/2011  ? ? ?REFERRING DIAG: M80.00XD (ICD-10-CM) - Age-related osteoporosis with current pathological fracture with routine healing, subsequent encounter  ? ?THERAPY DIAG:  ?Other low  back pain ? ?Other abnormalities of gait and mobility ? ?Other symptoms and signs involving the musculoskeletal system ? ?PERTINENT HISTORY: Osteoporosis, unsteadiness ? ?PRECAUTIONS: None ? ?SUBJECTIVE: Shoulders are a little sore from getting osteoporosis injections yesterday. Has done some exercises but not as many as she is supposed to. She got a walking stick but has not started using it yet.  ? ?PAIN:  ?Are you having pain? No ? ?  ?OBJECTIVE: Objective measures completed at initial evaluation unless otherwise dated ?POSTURE:  ?Slight forward head and slouched in seated ?  ?LUMBAR ROM:  ?  ?Active  A/PROM  ?11/27/2021  ?Flexion 0% limited  ?Extension 50% limited  ?Right lateral flexion 0% limited  ?Left lateral flexion 0% limited  ?Right rotation 25% limited  ?Left rotation 25% limited  ? (Blank rows = not tested) ?  ?LE ROM: WFL ?  ?Active  Right ?11/27/2021 Left ?11/27/2021  ?Hip flexion      ?Hip extension      ?Hip abduction      ?Hip adduction      ?Hip internal rotation      ?Hip external rotation      ?Knee flexion      ?Knee extension      ?Ankle dorsiflexion      ?  Ankle plantarflexion      ?Ankle inversion      ?Ankle eversion      ? (Blank rows = not tested) ?  ?LE MMT: ?  ?MMT Right ?11/27/2021 Left ?11/27/2021  ?Hip flexion 3+/5 3+/5  ?Hip extension      ?Hip abduction      ?Hip adduction      ?Hip internal rotation      ?Hip external rotation      ?Knee flexion 4+/5 4/5  ?Knee extension 4+/5 4+/5  ?Ankle dorsiflexion 4/5 4/5  ?Ankle plantarflexion      ?Ankle inversion      ?Ankle eversion      ? (Blank rows = not tested) ?  ?UPPER EXTREMITY ROM: WFL ?  ?Active ROM Right ?11/27/2021 Left ?11/27/2021  ?Shoulder flexion      ?Shoulder extension      ?Shoulder abduction      ?Shoulder adduction      ?Shoulder internal rotation      ?Shoulder external rotation      ?Elbow flexion      ?Elbow extension      ?Wrist flexion      ?Wrist extension      ?Wrist ulnar deviation      ?Wrist radial deviation      ?Wrist  pronation      ?Wrist supination      ?(Blank rows = not tested) ?*= pain ?  ?UPPER EXTREMITY MMT: ?  ?MMT Right ?11/27/2021 Left ?11/27/2021  ?Shoulder flexion 4-/5 4-/5  ?Shoulder extension      ?Shoulder abduction 4-/5 4-/5  ?Shoulder adduction      ?Shoulder internal rotation      ?Shoulder external rotation      ?Middle trapezius      ?Lower trapezius      ?Elbow flexion 4-/5 4-/5  ?Elbow extension 4-/5 4-/5  ?Wrist flexion      ?Wrist extension      ?Wrist ulnar deviation      ?Wrist radial deviation      ?Wrist pronation      ?Wrist supination      ?Grip strength (lbs)      ?(Blank rows = not tested) ?*= pain ?  ?  ?  ?  ?FUNCTIONAL TESTS:  ?30 seconds chair stand test: 9 reps without UE support, dynamic knee valgus bilaterally ?2 minute walk test: 375 feet without AD ?Stairs: 7 inch steps, alternating pattern with UE support ?  ?GAIT: ?Distance walked: 375 feet ?Assistive device utilized: None ?Level of assistance: Complete Independence ?Comments: , decreased hip extension on L, decreased R hip abduction strength with slight hip drop ?  ?  ?  ?TODAY'S TREATMENT  ?12/17/21 ?UBE 2 minutes forward/2 backward - Level 1 ?Row GTB 2x 10  ?Shoulder Extension RTB 2x 10  ?Palof press RTB 2x 10 bilateral  ?Step up 6 inch 1x 10 bilateral with unilateral UE support ?Step up 6 inch step with contralateral knee drive 2x 10 bilateral ? ? ?12/10/21: ? STS 2x 10 2nd set with GTB around thigh to improve knee alignment ?Standing hip abduction 2x 10 bilateral; 2nd set with GTB around thigh ?Hip extension 10x each ?Alternating march 10x wiht1 HHA ?Knee flexion 10x 2#  ?Tandem stance 2x 30" ?Squat 5x front of chair ? ? Yellow grip putty HEP ? ? ?  ?11/27/21 ?STS 2x 10  ?Standing hip abduction 2x 10 bilateral  ?  ?  ?PATIENT EDUCATION:  ?PATIENT EDUCATION:  ?  Education details: 11/27/21 Patient educated on exam findings, POC, scope of PT, HEP, osteoporosis, importance of weightbearing and strength training, and beginning a walking  program. 4/26 HEP ?Person educated: Patient ?Education method: Explanation, Demonstration, and Handouts ?Education comprehension: verbalized understanding, returned demonstration, verbal cues required, and tactile cues required ?  ?  ?HOME EXERCISE PROGRAM: ?Walking program, STS, standing hip abduction ?4/19: Given GTB to add to current STS and hip abd.  Putty for grip strengthening ?4/26 step up with knee drive ?  ?ASSESSMENT: ?  ?CLINICAL IMPRESSION: ?Began session with UBE for dynamic warm up with cueing for posture. Initiated resisted postural strengthening which is tolerated well. Decreased from GTB to RTB with shoulder extension due to compensations for periscapular and shoulder weakness. Requires unilateral UE support with step exercises for balance with cueing to decrease UE support as able. Notes moderate fatigue at end of session due to breathing/asthma. Patient will continue to benefit from physical therapy in order to improve function and reduce impairment. ? ?  ?  ?OBJECTIVE IMPAIRMENTS Abnormal gait, decreased activity tolerance, decreased balance, decreased endurance, decreased mobility, difficulty walking, decreased strength, impaired perceived functional ability, improper body mechanics, and postural dysfunction.  ?  ?ACTIVITY LIMITATIONS cleaning, community activity, meal prep, laundry, yard work, and shopping.  ?  ?PERSONAL FACTORS Fitness, Sex, Time since onset of injury/illness/exacerbation, and 1 comorbidity: osteoporosis  are also affecting patient's functional outcome.  ?  ?  ?REHAB POTENTIAL: Good ?  ?CLINICAL DECISION MAKING: Stable/uncomplicated ?  ?EVALUATION COMPLEXITY: Low ?  ?  ?GOALS: ?Goals reviewed with patient? Yes ?  ?SHORT TERM GOALS: Target date: 12/25/2021 ?  ?Patient will be independent with HEP in order to improve functional outcomes. ?Baseline:  ?Goal status: Ongoing ?  ?2.  Patient will report at least 25% improvement in symptoms for improved quality of life. ?Baseline:   ?Goal status: Ongoing ?  ?3.  Patient will be able to perform 12 STS in 30 seconds without dynamic knee valgus to demonstrate improved LE strength and power. ?Baseline:  ?Goal status: Ongoing ?  ?  ?  ?LONG TERM GOAL

## 2021-12-24 ENCOUNTER — Encounter (HOSPITAL_COMMUNITY): Payer: Medicare PPO | Admitting: Physical Therapy

## 2021-12-30 NOTE — Telephone Encounter (Signed)
Pt stated she was sick on her stomach. Appt canceled for today  ?

## 2021-12-31 ENCOUNTER — Ambulatory Visit (HOSPITAL_COMMUNITY): Payer: Medicare PPO | Attending: Family Medicine | Admitting: Physical Therapy

## 2021-12-31 ENCOUNTER — Encounter (HOSPITAL_COMMUNITY): Payer: Self-pay | Admitting: Physical Therapy

## 2021-12-31 DIAGNOSIS — R29898 Other symptoms and signs involving the musculoskeletal system: Secondary | ICD-10-CM | POA: Insufficient documentation

## 2021-12-31 DIAGNOSIS — M5459 Other low back pain: Secondary | ICD-10-CM | POA: Insufficient documentation

## 2021-12-31 DIAGNOSIS — R2689 Other abnormalities of gait and mobility: Secondary | ICD-10-CM | POA: Diagnosis not present

## 2021-12-31 NOTE — Therapy (Signed)
?OUTPATIENT PHYSICAL THERAPY TREATMENT NOTE ? ? ?Patient Name: Kathleen Hammond ?MRN: [id number] ?DOB:07/11/1956, 66 y.o., female ?Today's Date: 12/31/2021 ? ?PCP: Hall, John Z, MD ?REFERRING PROVIDER: Schmitz, Jeremy E, MD ? ?END OF SESSION:  ? PT End of Session - 12/31/21 0834   ? ? Visit Number 4   ? Number of Visits 8   ? Date for PT Re-Evaluation 01/22/22   ? Authorization Type [employer]   ? Authorization Time Period 8 visits approved 04/06-->01/22/22   ? Authorization - Visit Number 4   ? Authorization - Number of Visits 8   ? PT Start Time 0835   ? PT Stop Time 0908   ? PT Time Calculation (min) 33 min   ? Activity Tolerance Patient tolerated treatment well   ? Behavior During Therapy WFL for tasks assessed/performed   ? ?  ?  ? ?  ? ? ?Past Medical History:  ?Diagnosis Date  ? Alpha-1-antitrypsin deficiency (HCC)   ? Asthma   ? History of anemia   ? Perimenopausal vasomotor symptoms   ? Postmenopausal   ? Urinary incontinence   ? ?Past Surgical History:  ?Procedure Laterality Date  ? BREAST EXCISIONAL BIOPSY Right 1996  ? CATARACT EXTRACTION Right 05/2014  ? [place]  ? CATARACT EXTRACTION Left 05/2014  ? [facility]  ? CESAREAN SECTION    ? CHOLECYSTECTOMY    ? cyst removed from the gum    ? DILATION AND CURETTAGE OF UTERUS    ? GALLBLADDER SURGERY    ? lump removed from the breast    ? TUBAL LIGATION    ? YAG LASER APPLICATION Left 2016  ? [facility]  ? YAG LASER APPLICATION Right 2016  ? [facility]  ? ?Patient Active Problem List  ? Diagnosis Date Noted  ? Posterior vitreous detachment of right eye 03/28/2020  ? Intermediate stage nonexudative age-related macular degeneration of both eyes 03/28/2020  ? Retinal detachment of left eye with retinal break 03/05/2020  ? Osteoporosis 08/28/2019  ? Mixed hyperlipidemia 03/16/2011  ? Sinus tachycardia 03/16/2011  ? ? ?REFERRING DIAG: M80.00XD (ICD-10-CM) - Age-related osteoporosis with current pathological fracture with routine healing, subsequent encounter  ? ?THERAPY DIAG:  ?Other  low back pain ? ?Other abnormalities of gait and mobility ? ?Other symptoms and signs involving the musculoskeletal system ? ?PERTINENT HISTORY: Osteoporosis, unsteadiness ? ?PRECAUTIONS: None ? ?SUBJECTIVE: Has had issues with her asthma. Stepping exercises messed with asthma. Has not done many exercises. Has been using walking stick. Wants to progress to HEP independent with d/c. Wants to update HEP and work on and then reassess at end of POC.  ? ?PAIN:  ?Are you having pain? No ? ?  ?OBJECTIVE: Objective measures completed at initial evaluation unless otherwise dated ?POSTURE:  ?Slight forward head and slouched in seated ?  ?LUMBAR ROM:  ?  ?Active  A/PROM  ?11/27/2021  ?Flexion 0% limited  ?Extension 50% limited  ?Right lateral flexion 0% limited  ?Left lateral flexion 0% limited  ?Right rotation 25% limited  ?Left rotation 25% limited  ? (Blank rows = not tested) ?  ?LE ROM: WFL ?  ?Active  Right ?11/27/2021 Left ?11/27/2021  ?Hip flexion      ?Hip extension      ?Hip abduction      ?Hip adduction      ?Hip internal rotation      ?Hip external rotation      ?Knee flexion      ?Knee extension      ?  Ankle dorsiflexion      ?Ankle plantarflexion      ?Ankle inversion      ?Ankle eversion      ? (Blank rows = not tested) ?  ?LE MMT: ?  ?MMT Right ?11/27/2021 Left ?11/27/2021  ?Hip flexion 3+/5 3+/5  ?Hip extension      ?Hip abduction      ?Hip adduction      ?Hip internal rotation      ?Hip external rotation      ?Knee flexion 4+/5 4/5  ?Knee extension 4+/5 4+/5  ?Ankle dorsiflexion 4/5 4/5  ?Ankle plantarflexion      ?Ankle inversion      ?Ankle eversion      ? (Blank rows = not tested) ?  ?UPPER EXTREMITY ROM: WFL ?  ?Active ROM Right ?11/27/2021 Left ?11/27/2021  ?Shoulder flexion      ?Shoulder extension      ?Shoulder abduction      ?Shoulder adduction      ?Shoulder internal rotation      ?Shoulder external rotation      ?Elbow flexion      ?Elbow extension      ?Wrist flexion      ?Wrist extension      ?Wrist ulnar  deviation      ?Wrist radial deviation      ?Wrist pronation      ?Wrist supination      ?(Blank rows = not tested) ?*= pain ?  ?UPPER EXTREMITY MMT: ?  ?MMT Right ?11/27/2021 Left ?11/27/2021    ?Shoulder flexion 4-/5 4-/5    ?Shoulder extension        ?Shoulder abduction 4-/5 4-/5    ?Shoulder adduction        ?Shoulder internal rotation        ?Shoulder external rotation        ?Middle trapezius        ?Lower trapezius        ?Elbow flexion 4-/5 4-/5    ?Elbow extension 4-/5 4-/5    ?Wrist flexion        ?Wrist extension        ?Wrist ulnar deviation        ?Wrist radial deviation        ?Wrist pronation        ?Wrist supination        ?Grip strength (lbs)        ?(Blank rows = not tested) ?*= pain ?  ?  ?  ?  ?FUNCTIONAL TESTS:  ?30 seconds chair stand test: 9 reps without UE support, dynamic knee valgus bilaterally ?2 minute walk test: 375 feet without AD ?Stairs: 7 inch steps, alternating pattern with UE support ?  ?GAIT: ?Distance walked: 375 feet ?Assistive device utilized: None ?Level of assistance: Complete Independence ?Comments: 2MWT, decreased hip extension on L, decreased R hip abduction strength with slight hip drop ?  ?  ?  ?TODAY'S TREATMENT  ?12/31/21 ?Review of HEP ?Progression of HEP and discussion of POC ? ?12/17/21 ?UBE 2 minutes forward/2 backward - Level 1 ?Row GTB 2x 10  ?Shoulder Extension RTB 2x 10  ?Palof press RTB 2x 10 bilateral  ?Step up 6 inch 1x 10 bilateral with unilateral UE support ?Step up 6 inch step with contralateral knee drive 2x 10 bilateral ? ? ?12/10/21: ? STS 2x 10 2nd set with GTB around thigh to improve knee alignment ?Standing hip abduction 2x 10 bilateral; 2nd set with GTB around   thigh ?Hip extension 10x each ?Alternating march 10x wiht1 HHA ?Knee flexion 10x 2#  ?Tandem stance 2x 30" ?Squat 5x front of chair ? ? Yellow grip putty HEP ? ? ?  ?11/27/21 ?STS 2x 10  ?Standing hip abduction 2x 10 bilateral  ?  ?  ?PATIENT EDUCATION:  ?PATIENT EDUCATION:  ?Education details: 11/27/21  Patient educated on exam findings, POC, scope of PT, HEP, osteoporosis, importance of weightbearing and strength training, and beginning a walking program. 4/26 HEP 5/10 POC, HEP ?Person educated: Patient ?Education method: Explanation, Demonstration, and Handouts ?Education comprehension: verbalized understanding, returned demonstration, verbal cues required, and tactile cues required ?  ?  ?HOME EXERCISE PROGRAM: ?Walking program, STS, standing hip abduction ?4/19: Given GTB to add to current STS and hip abd.  Putty for grip strengthening ?4/26 step up with knee drive ?5/10 Row, extension, palof ?  ?ASSESSMENT: ?  ?CLINICAL IMPRESSION: ?Patient not wishing to complete exercise/assessment today as breathing has been bothering her with recent flare up. Discussed POC with patient and she is agreeable to continue with HEP and progress HEP today and f/u up at end of month for reassessment with likely d/c from PT. Reviewed HEP with patient and progress based on prior completed exercises. Patient also educated on proper exercise and benefits from exercise on bone density and posture. Patient shown how to complete at home. Will plan on reassessment next session. Patient will continue to benefit from physical therapy in order to improve function and reduce impairment. ? ?  ?  ?OBJECTIVE IMPAIRMENTS Abnormal gait, decreased activity tolerance, decreased balance, decreased endurance, decreased mobility, difficulty walking, decreased strength, impaired perceived functional ability, improper body mechanics, and postural dysfunction.  ?  ?ACTIVITY LIMITATIONS cleaning, community activity, meal prep, laundry, yard work, and shopping.  ?  ?PERSONAL FACTORS Fitness, Sex, Time since onset of injury/illness/exacerbation, and 1 comorbidity: osteoporosis  are also affecting patient's functional outcome.  ?  ?  ?REHAB POTENTIAL: Good ?  ?CLINICAL DECISION MAKING: Stable/uncomplicated ?  ?EVALUATION COMPLEXITY: Low ?  ?  ?GOALS: ?Goals  reviewed with patient? Yes ?  ?SHORT TERM GOALS: Target date: 12/25/2021 ?  ?Patient will be independent with HEP in order to improve functional outcomes. ?Baseline:  ?Goal status: Partially met ?  ?2.  Pati

## 2021-12-31 NOTE — Patient Instructions (Signed)
Access Code: [email or web address] ?URL: [email or web address] ?Date: 12/31/2021 ?Prepared by: Greig Castilla Lenus Trauger ? ?Exercises ?- Standing Shoulder Row with Anchored Resistance  - 1 x daily - 7 x weekly - 2 sets - 10 reps ?- Shoulder extension with resistance - Neutral  - 1 x daily - 7 x weekly - 2 sets - 10 reps ?- Standing Anti-Rotation Press with Anchored Resistance  - 1 x daily - 7 x weekly - 2 sets - 10 reps ?

## 2022-01-02 DIAGNOSIS — H10413 Chronic giant papillary conjunctivitis, bilateral: Secondary | ICD-10-CM | POA: Diagnosis not present

## 2022-01-02 DIAGNOSIS — H43391 Other vitreous opacities, right eye: Secondary | ICD-10-CM | POA: Diagnosis not present

## 2022-01-02 DIAGNOSIS — Z961 Presence of intraocular lens: Secondary | ICD-10-CM | POA: Diagnosis not present

## 2022-01-02 DIAGNOSIS — H353131 Nonexudative age-related macular degeneration, bilateral, early dry stage: Secondary | ICD-10-CM | POA: Diagnosis not present

## 2022-01-02 DIAGNOSIS — H5052 Exophoria: Secondary | ICD-10-CM | POA: Diagnosis not present

## 2022-01-02 DIAGNOSIS — H338 Other retinal detachments: Secondary | ICD-10-CM | POA: Diagnosis not present

## 2022-01-07 ENCOUNTER — Encounter (HOSPITAL_COMMUNITY): Payer: Medicare PPO | Admitting: Physical Therapy

## 2022-01-14 ENCOUNTER — Encounter (HOSPITAL_COMMUNITY): Payer: Medicare PPO | Admitting: Physical Therapy

## 2022-01-15 ENCOUNTER — Ambulatory Visit (INDEPENDENT_AMBULATORY_CARE_PROVIDER_SITE_OTHER): Payer: Medicare PPO | Admitting: *Deleted

## 2022-01-15 DIAGNOSIS — M8000XD Age-related osteoporosis with current pathological fracture, unspecified site, subsequent encounter for fracture with routine healing: Secondary | ICD-10-CM

## 2022-01-15 NOTE — Progress Notes (Signed)
Patient here for nurse visit for her 9th Evenity. Patient received SQ injections in bilateral arms. Patient tolerated injections well. She will return in 1 month for her next Evenity.

## 2022-01-21 ENCOUNTER — Ambulatory Visit (HOSPITAL_COMMUNITY): Payer: Medicare PPO | Admitting: Physical Therapy

## 2022-01-21 ENCOUNTER — Encounter (HOSPITAL_COMMUNITY): Payer: Self-pay | Admitting: Physical Therapy

## 2022-01-21 DIAGNOSIS — R29898 Other symptoms and signs involving the musculoskeletal system: Secondary | ICD-10-CM

## 2022-01-21 DIAGNOSIS — M5459 Other low back pain: Secondary | ICD-10-CM | POA: Diagnosis not present

## 2022-01-21 DIAGNOSIS — R2689 Other abnormalities of gait and mobility: Secondary | ICD-10-CM

## 2022-01-21 NOTE — Therapy (Signed)
OUTPATIENT PHYSICAL THERAPY TREATMENT NOTE   Patient Name: Kathleen Hammond MRN: [id number] DOB:[birth year], 66 y.o., female Today's Date: 01/21/2022  PCP: Celene Squibb, MD REFERRING PROVIDER: Rosemarie Ax, MD  PHYSICAL THERAPY DISCHARGE SUMMARY  Visits from Start of Care: 5  Current functional level related to goals / functional outcomes: See below   Remaining deficits: See below   Education / Equipment: HEP, walking program   Patient agrees to discharge. Patient goals were not met. Patient is being discharged due to being pleased with the current functional level.   END OF SESSION:   PT End of Session - 01/21/22 0747     Visit Number 5    Number of Visits 8    Date for PT Re-Evaluation 01/22/22    Authorization Type [employer]    Authorization Time Period 8 visits approved 04/06-->01/22/22    Authorization - Visit Number 5    Authorization - Number of Visits 8    PT Start Time 0093    PT Stop Time 0820    PT Time Calculation (min) 33 min    Activity Tolerance Patient tolerated treatment well    Behavior During Therapy WFL for tasks assessed/performed             Past Medical History:  Diagnosis Date   Alpha-1-antitrypsin deficiency ([place])    Asthma    History of anemia    Perimenopausal vasomotor symptoms    Postmenopausal    Urinary incontinence    Past Surgical History:  Procedure Laterality Date   BREAST EXCISIONAL BIOPSY Right 1996   CATARACT EXTRACTION Right 05/2014   [facility]   CATARACT EXTRACTION Left 05/2014   [facility]   CESAREAN SECTION     CHOLECYSTECTOMY     cyst removed from the gum     DILATION AND CURETTAGE OF UTERUS     GALLBLADDER SURGERY     lump removed from the breast     TUBAL LIGATION     YAG LASER APPLICATION Left 8182   [facility]   YAG LASER APPLICATION Right 9937   [facility]   Patient Active Problem List   Diagnosis Date Noted   Posterior vitreous detachment of right eye 03/28/2020   Intermediate stage nonexudative  age-related macular degeneration of both eyes 03/28/2020   Retinal detachment of left eye with retinal break 03/05/2020   Osteoporosis 08/28/2019   Mixed hyperlipidemia 03/16/2011   Sinus tachycardia 03/16/2011    REFERRING DIAG: M80.00XD (ICD-10-CM) - Age-related osteoporosis with current pathological fracture with routine healing, subsequent encounter   THERAPY DIAG:  Other low back pain  Other abnormalities of gait and mobility  Other symptoms and signs involving the musculoskeletal system  PERTINENT HISTORY: Osteoporosis, unsteadiness  PRECAUTIONS: None  SUBJECTIVE: Patient feels like she is moving better. She needs to be more consistent with exercises. Exercises 1x/week. Patient states 50% improvement since beginning PT. Has walked several times but not consistent. Finishes shots in August and will then have another scan of bone density.   PAIN:  Are you having pain? No    OBJECTIVE: Objective measures completed at initial evaluation unless otherwise dated POSTURE:  Slight forward head and slouched in seated   LUMBAR ROM:    Active  A/PROM  11/27/2021 AROM 01/21/22  Flexion 0% limited 0% limited  Extension 50% limited 25% limited  Right lateral flexion 0% limited 0% limited  Left lateral flexion 0% limited 0% limited  Right rotation 25% limited 0% limited  Left rotation 25%  limited 0% limited   (Blank rows = not tested)   LE ROM: [facility]   Active  Right 11/27/2021 Left 11/27/2021  Hip flexion      Hip extension      Hip abduction      Hip adduction      Hip internal rotation      Hip external rotation      Knee flexion      Knee extension      Ankle dorsiflexion      Ankle plantarflexion      Ankle inversion      Ankle eversion       (Blank rows = not tested)   LE MMT:   MMT Right 11/27/2021 Left 11/27/2021 Right 01/21/22 Left 01/21/22  Hip flexion 3+/5 3+/5 3+/5 3+/5  Hip extension        Hip abduction        Hip adduction        Hip internal rotation         Hip external rotation        Knee flexion 4+/5 4/5 4+/5 4+/5  Knee extension 4+/5 4+/5 5/5 5/5  Ankle dorsiflexion 4/5 4/5 5/5 5/5  Ankle plantarflexion        Ankle inversion        Ankle eversion         (Blank rows = not tested)   UPPER EXTREMITY ROM: [facility]   Active ROM Right 11/27/2021 Left 11/27/2021  Shoulder flexion      Shoulder extension      Shoulder abduction      Shoulder adduction      Shoulder internal rotation      Shoulder external rotation      Elbow flexion      Elbow extension      Wrist flexion      Wrist extension      Wrist ulnar deviation      Wrist radial deviation      Wrist pronation      Wrist supination      (Blank rows = not tested) *= pain   UPPER EXTREMITY MMT:   MMT Right 11/27/2021 Left 11/27/2021 Right 01/21/22 Left  01/21/22  Shoulder flexion 4-/5 4-/5 4-/5 4-/5  Shoulder extension        Shoulder abduction 4-/5 4-/5 4-/5 4-/5  Shoulder adduction        Shoulder internal rotation        Shoulder external rotation        Middle trapezius        Lower trapezius        Elbow flexion 4-/5 4-/5 4+/5 4+/5  Elbow extension 4-/5 4-/5 4-/5 4/5  Wrist flexion        Wrist extension        Wrist ulnar deviation        Wrist radial deviation        Wrist pronation        Wrist supination        Grip strength (lbs)        (Blank rows = not tested) *= pain         FUNCTIONAL TESTS:  30 seconds chair stand test: 9 reps without UE support, dynamic knee valgus bilaterally 2 minute walk test: 375 feet without AD Stairs: 7 inch steps, alternating pattern with UE support   GAIT: Distance walked: 375 feet Assistive device utilized: None Level of assistance: Complete Independence Comments: 2MWT,  decreased hip extension on L, decreased R hip abduction strength with slight hip drop   FUNCTIONAL TESTS: 01/21/22 30 seconds chair stand test: 9 reps without UE support, dynamic knee valgus bilaterally 2 minute walk test: 395 feet without  AD Stairs: 7 inch steps, alternating pattern with unilateral UE support  GAIT:01/21/22 Distance walked: 395 feet Assistive device utilized: None Level of assistance: Complete Independence Comments: 2MWT, decreased hip extension on L, decreased R hip abduction strength with slight hip drop   TODAY'S TREATMENT  01/21/22 Reassessment and education  12/31/21 Review of HEP Progression of HEP and discussion of POC  12/17/21 UBE 2 minutes forward/2 backward - Level 1 Row GTB 2x 10  Shoulder Extension RTB 2x 10  Palof press RTB 2x 10 bilateral  Step up 6 inch 1x 10 bilateral with unilateral UE support Step up 6 inch step with contralateral knee drive 2x 10 bilateral   12/10/21:  STS 2x 10 2nd set with GTB around thigh to improve knee alignment Standing hip abduction 2x 10 bilateral; 2nd set with GTB around thigh Hip extension 10x each Alternating march 10x wiht1 HHA Knee flexion 10x 2#  Tandem stance 2x 30" Squat 5x front of chair   Yellow grip putty HEP     11/27/21 STS 2x 10  Standing hip abduction 2x 10 bilateral      PATIENT EDUCATION:  PATIENT EDUCATION:  Education details: 11/27/21 Patient educated on exam findings, POC, scope of PT, HEP, osteoporosis, importance of weightbearing and strength training, and beginning a walking program. 4/26 HEP 5/10 POC, HEP 01/21/22 walking program, ways to incorporate exercise/movement into daily routine, importance of regular exercise, reassessment findings, returning to PT if needed Person educated: Patient Education method: Explanation, Demonstration, and Handouts Education comprehension: verbalized understanding, returned demonstration, verbal cues required, and tactile cues required     HOME EXERCISE PROGRAM: Walking program, STS, standing hip abduction 4/19: Given GTB to add to current STS and hip abd.  Putty for grip strengthening 4/26 step up with knee drive 8/36 Row, extension, palof   ASSESSMENT:   CLINICAL  IMPRESSION: Patient has met 1/3 short term goals and 0/4 long term goals with stated improvement in symptoms and partially met 1 short term goal with ability to complete HEP but not consistently. Remaining goals not met due to continued strength deficits, not beginning walking program, decreased gait speed, and impaired functional mobility/strength. Progress slow/lacking likely due to decreased adherence to HEP and education but patient aware of changes she needs to make to improve compliance. Patient educated as seen above. Patient discharged from PT at this time.       OBJECTIVE IMPAIRMENTS Abnormal gait, decreased activity tolerance, decreased balance, decreased endurance, decreased mobility, difficulty walking, decreased strength, impaired perceived functional ability, improper body mechanics, and postural dysfunction.    ACTIVITY LIMITATIONS cleaning, community activity, meal prep, laundry, yard work, and shopping.    PERSONAL FACTORS Fitness, Sex, Time since onset of injury/illness/exacerbation, and 1 comorbidity: osteoporosis  are also affecting patient's functional outcome.      REHAB POTENTIAL: Good   CLINICAL DECISION MAKING: Stable/uncomplicated   EVALUATION COMPLEXITY: Low     GOALS: Goals reviewed with patient? Yes   SHORT TERM GOALS: Target date: 12/25/2021   Patient will be independent with HEP in order to improve functional outcomes. Baseline:  Goal status: Partially met   2.  Patient will report at least 25% improvement in symptoms for improved quality of life. Baseline: 5/10 50% Goal status: met  3.  Patient will be able to perform 12 STS in 30 seconds without dynamic knee valgus to demonstrate improved LE strength and power. Baseline: eval 9 reps 5/31 9 ret Goal status: Not Met       LONG TERM GOALS: Target date: 01/22/2022   Patient will report at least 75% improvement in symptoms for improved quality of life. Baseline:  Goal status: Not Met   2.  Patient  will demonstrate at least 4+/5 MMT in all tested muscles to show improving strength for chores. Baseline: see MMT Goal status: Not Met   3.  Patient will continue walking/exercise program for improved ability to maintain/improve bone density to reduce risk of fracture. Baseline:  Goal status: Not met   4.  Patient will be able to ambulate at least 450 feet in 2MWT in order to demonstrate improved tolerance to activity. Baseline:  5/31 395 feet Goal status: Not Met       PLAN: PT FREQUENCY: n/a   PT DURATION: n/a   PLANNED INTERVENTIONS: Therapeutic exercises, Therapeutic activity, Neuromuscular re-education, Balance training, Gait training, Patient/Family education, Joint manipulation, Joint mobilization, Stair training, Orthotic/Fit training, DME instructions, Aquatic Therapy, Dry Needling, Electrical stimulation, Spinal manipulation, Spinal mobilization, Cryotherapy, Moist heat, Compression bandaging, scar mobilization, Splintting, Taping, Traction, Ultrasound, Ionotophoresis 23m/ml Dexamethasone, and Manual therapy     PLAN FOR NEXT SESSION: n/a   8:26 AM, 01/21/22 AMearl LatinPT, DPT [facility]

## 2022-01-30 ENCOUNTER — Telehealth: Payer: Self-pay | Admitting: Pulmonary Disease

## 2022-01-30 MED ORDER — PREDNISONE 20 MG PO TABS: ORAL_TABLET | ORAL | 0 refills | Status: DC
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
## 2022-01-30 NOTE — Telephone Encounter (Signed)
Okay to send in prednisone 40 mg a day for 5 days.

## 2022-01-30 NOTE — Telephone Encounter (Signed)
Spoke with pt and notified her that Prednisone will be sent in. Pt confirmed that pharmacy was [facility] in [place]. Prednisone sent in. Nothing further needed at this time.

## 2022-02-01 ENCOUNTER — Other Ambulatory Visit: Payer: Self-pay | Admitting: Pulmonary Disease

## 2022-02-16 ENCOUNTER — Ambulatory Visit: Payer: Medicare PPO

## 2022-02-25 ENCOUNTER — Ambulatory Visit (INDEPENDENT_AMBULATORY_CARE_PROVIDER_SITE_OTHER): Payer: Medicare PPO | Admitting: *Deleted

## 2022-02-25 DIAGNOSIS — M8000XD Age-related osteoporosis with current pathological fracture, unspecified site, subsequent encounter for fracture with routine healing: Secondary | ICD-10-CM

## 2022-02-25 MED ORDER — ROMOSOZUMAB-AQQG 105 MG/1.17ML ~~LOC~~ SOSY
210.0000 mg | PREFILLED_SYRINGE | Freq: Once | SUBCUTANEOUS | Status: AC
  Administered 2022-02-25: 210 mg via SUBCUTANEOUS

## 2022-02-25 NOTE — Progress Notes (Signed)
Patient here for nurse visit for her 10th Evenity. Patient received SQ injections in bilateral arms. Patient tolerated injections well. She will return in 1 month for her next Evenity.

## 2022-02-27 DIAGNOSIS — E559 Vitamin D deficiency, unspecified: Secondary | ICD-10-CM | POA: Diagnosis not present

## 2022-02-27 DIAGNOSIS — E782 Mixed hyperlipidemia: Secondary | ICD-10-CM | POA: Diagnosis not present

## 2022-02-28 LAB — LAB REPORT - SCANNED: EGFR: 70

## 2022-03-06 DIAGNOSIS — E8801 Alpha-1-antitrypsin deficiency: Secondary | ICD-10-CM | POA: Diagnosis not present

## 2022-03-06 DIAGNOSIS — H43811 Vitreous degeneration, right eye: Secondary | ICD-10-CM | POA: Diagnosis not present

## 2022-03-06 DIAGNOSIS — E559 Vitamin D deficiency, unspecified: Secondary | ICD-10-CM | POA: Diagnosis not present

## 2022-03-06 DIAGNOSIS — E782 Mixed hyperlipidemia: Secondary | ICD-10-CM | POA: Diagnosis not present

## 2022-03-06 DIAGNOSIS — M75101 Unspecified rotator cuff tear or rupture of right shoulder, not specified as traumatic: Secondary | ICD-10-CM | POA: Diagnosis not present

## 2022-03-06 DIAGNOSIS — M722 Plantar fascial fibromatosis: Secondary | ICD-10-CM | POA: Diagnosis not present

## 2022-03-06 DIAGNOSIS — M810 Age-related osteoporosis without current pathological fracture: Secondary | ICD-10-CM | POA: Diagnosis not present

## 2022-03-06 DIAGNOSIS — K219 Gastro-esophageal reflux disease without esophagitis: Secondary | ICD-10-CM | POA: Diagnosis not present

## 2022-03-06 DIAGNOSIS — K589 Irritable bowel syndrome without diarrhea: Secondary | ICD-10-CM | POA: Diagnosis not present

## 2022-03-09 ENCOUNTER — Encounter: Payer: Self-pay | Admitting: Pulmonary Disease

## 2022-03-09 ENCOUNTER — Other Ambulatory Visit: Payer: Self-pay | Admitting: Pulmonary Disease

## 2022-03-10 MED ORDER — PREDNISONE 20 MG PO TABS: ORAL_TABLET | ORAL | 0 refills | Status: DC

## 2022-03-25 DIAGNOSIS — K219 Gastro-esophageal reflux disease without esophagitis: Secondary | ICD-10-CM | POA: Diagnosis not present

## 2022-03-30 ENCOUNTER — Ambulatory Visit: Payer: Medicare PPO

## 2022-03-31 ENCOUNTER — Other Ambulatory Visit: Payer: Self-pay | Admitting: Obstetrics and Gynecology

## 2022-03-31 DIAGNOSIS — Z1231 Encounter for screening mammogram for malignant neoplasm of breast: Secondary | ICD-10-CM

## 2022-04-06 ENCOUNTER — Ambulatory Visit (INDEPENDENT_AMBULATORY_CARE_PROVIDER_SITE_OTHER): Payer: Medicare PPO | Admitting: *Deleted

## 2022-04-06 ENCOUNTER — Encounter: Payer: Self-pay | Admitting: *Deleted

## 2022-04-06 DIAGNOSIS — M8000XD Age-related osteoporosis with current pathological fracture, unspecified site, subsequent encounter for fracture with routine healing: Secondary | ICD-10-CM | POA: Diagnosis not present

## 2022-04-06 MED ORDER — ROMOSOZUMAB-AQQG 105 MG/1.17ML ~~LOC~~ SOSY
210.0000 mg | PREFILLED_SYRINGE | Freq: Once | SUBCUTANEOUS | Status: AC
  Administered 2022-04-06: 210 mg via SUBCUTANEOUS

## 2022-04-06 NOTE — Progress Notes (Signed)
Patient here for nurse visit for her 11th Evenity. Patient received SQ injections in bilateral arms. Patient tolerated injections well. She will return in 1 month for her 12th Evenity.

## 2022-04-07 DIAGNOSIS — Z780 Asymptomatic menopausal state: Secondary | ICD-10-CM | POA: Diagnosis not present

## 2022-04-07 DIAGNOSIS — M8589 Other specified disorders of bone density and structure, multiple sites: Secondary | ICD-10-CM | POA: Diagnosis not present

## 2022-04-16 DIAGNOSIS — E559 Vitamin D deficiency, unspecified: Secondary | ICD-10-CM | POA: Diagnosis not present

## 2022-04-16 DIAGNOSIS — M810 Age-related osteoporosis without current pathological fracture: Secondary | ICD-10-CM | POA: Diagnosis not present

## 2022-04-17 ENCOUNTER — Telehealth: Payer: Self-pay | Admitting: Pulmonary Disease

## 2022-04-17 ENCOUNTER — Telehealth (INDEPENDENT_AMBULATORY_CARE_PROVIDER_SITE_OTHER): Payer: Medicare PPO | Admitting: Acute Care

## 2022-04-17 ENCOUNTER — Encounter: Payer: Self-pay | Admitting: Acute Care

## 2022-04-17 DIAGNOSIS — J069 Acute upper respiratory infection, unspecified: Secondary | ICD-10-CM

## 2022-04-17 MED ORDER — PREDNISONE 10 MG PO TABS: ORAL_TABLET | ORAL | 0 refills | Status: DC

## 2022-04-17 MED ORDER — AZITHROMYCIN 250 MG PO TABS: ORAL_TABLET | ORAL | 0 refills | Status: DC

## 2022-04-17 NOTE — Telephone Encounter (Signed)
Called and spoke with pt who states she began having symptoms 3 days ago including wheezing, increased SOB, and a dry hacky cough.  Pt has been using her symbicort as prescribed, using her allergy meds. Pt has been using her albuterol inhaler twice a day once in the morning and once at night. States she also has a lot of tightness in her chest and also has been having to clear her throat a lot. Pt has been taking mucinex fast max as well to see if that would help.  Asked pt if she could do a video visit and she said she could. Mychart visit scheduled for pt with SG. Nothing further needed.

## 2022-04-17 NOTE — Patient Instructions (Addendum)
It was good to talk with you today. We will send in a prescription for a z pac 250 mg, Take 2 tablets ( 500 mg total) Day 1 Take 1 tablet ( 250 mg)  Day 2 through 5  We will send in a prescription for a prednisone taper Prednisone taper; 10 mg tablets: 4 tabs x 2 days, 3 tabs x 2 days, 2 tabs x 2 days 1 tab x 2 days then stop.  Take until gone Call the office if you do not feel better , or seek emergency care if your breathing becomes worse . Continue Symbicort 2 puff in the morning and 2 puffs in the evening. Rinse mouth after use. Use Albuterol inhaler or nebs as needed for breakthrough shortness of breath or wheezing. Continue Mucinex as needed for congestion.   Follow up with Dr. Isaiah Serge in 2 weeks to 1 month to ensure you are getting better, and determine need for further testing/ labs.  Please contact office for sooner follow up if symptoms do not improve or worsen or seek emergency care .

## 2022-04-17 NOTE — Progress Notes (Signed)
Virtual Visit via Video Note  I connected with Kathleen Hammond on 04/17/22 at 11:00 AM EDT by a video enabled telemedicine application and verified that I am speaking with the correct person using two identifiers.  Location: Patient:  At home Provider: [address] [place], [place], [address]    I discussed the limitations of evaluation and management by telemedicine and the availability of in person appointments. The patient expressed understanding and agreed to proceed.  Synopsis Kathleen Hammond is a 66 y.o. female with  history of heterozygous alpha-1 antitrypsin carrier, and mild asthma.  Diagnosed over 10 years ago. She is followed by Dr. Isaiah Hammond.   Has been asymptomatic with no inhaler therapy.  She was also evaluated at [facility] and advised that she did not need replacement therapy.  Has history of asthma which appears to be mild.  Previously on Symbicort but currently on albuterol with good control of symptoms   In November 2021 she required prednisone for 5 days after she developed a flareup of asthma after Moderna booster.  She was last seen 09/2020 by Dr. Isaiah Hammond at that time she was stable. She states this summer has been difficult, in regards to her respiratory status. She stated she probably should  have come in before now.    History of Present Illness: Pt. Presents for an acute visit for sinus pain and congestion, teeth pain, increasing dyspnea and chest tightness with a dry non-productive cough. She denies any fever.She states this has been progressively worse, and has been over the last several days. She states she has started having some body aches today. She does not endorse fever or chills. She has not checked her temperature however. She has been using her Symbicort 2 puffs twice daily, and  albuterol in the morning and evening. She does have nebs at home in addition to her inhaler, but she has not been using her nebs.   Observations/Objective: Pt. Is not in distress that I  can see. She is able to speak in full sentences. Skin color and mucus membranes are pink. I do not hear any audible wheezing. She clearly does not feel well.   Imaging: Chest x-ray 04/06/2012-no acute cardiopulmonary abnormality CT abdomen pelvis 05/22/13-visualized lung bases are normal. Chest x-ray 06/14/2019-no acute cardiopulmonary abnormality I have reviewed the images personally.   PFTs: 08/03/2018 FVC 3.29 [95%], FEV1 2.45 [92%], F/F 75, TLC 5.16 [96%], DLCO 14.58 [54%) No obstruction or restriction.  Moderate diffusion impairment   10/15/2020 FVC 3.11 [92%], FEV1 2.40 [93%], F/F 77, TLC 4.75 [88%], DLCO 17.71 [4%] Normal test     Labs: Alpha-1 antitrypsin 06/14/2019-72, PI MZ Hepatic panel 06/14/2019-within normal limits  Assessment and Plan: Sinus infection which has triggered an Asthma Flare  Non productive cough Chest tightness and worsening shortness of breath  Sinus and tooth pain Afebrile Plan We will send in a prescription for a z pac 250 mg, Take 2 tablets ( 500 mg total) Day 1 Take 1 tablet ( 250 mg)  Day 2 through 5  We will send in a prescription for a prednisone taper Prednisone taper; 10 mg tablets: 4 tabs x 2 days, 3 tabs x 2 days, 2 tabs x 2 days 1 tab x 2 days then stop.  Take until gone Call the office if you do not feel better , or seek emergency care if your breathing becomes worse . Continue Symbicort 2 puff in the morning and 2 puffs in the evening. Rinse  mouth after use. Use Albuterol inhaler or nebs as needed for breakthrough shortness of breath or wheezing. Continue Mucinex as needed for congestion.   Follow up with Dr. Vaughan Hammond in 2 weeks to 1 month to ensure you are getting better, and determine need for further testing/ labs.  Please contact office for sooner follow up if symptoms do not improve or worsen or seek emergency care .   Follow Up Instructions: September 20th with Dr. Vaughan Hammond   I discussed the assessment and treatment plan with the  patient. The patient was provided an opportunity to ask questions and all were answered. The patient agreed with the plan and demonstrated an understanding of the instructions.   The patient was advised to call back or seek an in-person evaluation if the symptoms worsen or if the condition fails to improve as anticipated.  I provided 35 minutes of non-face-to-face time during this encounter.   Kathleen Spatz, NP  History of Present Illness     04/17/2022   Test Results:   CBC 06/13/2020- WBC 4.6, eos 8.8%, absolute eosinophil count 405 CBC 10/15/2020-WBC 11.6, eos 0.6%, absolute eosinophil count 70       Latest Ref Rng & Units 10/15/2020   11:19 AM 06/14/2019   10:57 AM 09/02/2017   12:00 AM  CBC  WBC 4.0 - 10.5 K/uL 11.6  4.6  4.6      Hemoglobin 12.0 - 15.0 g/dL 13.9  13.2  13.9      Hematocrit 36.0 - 46.0 % 41.5  39.0  42      Platelets 150.0 - 400.0 K/uL 270.0  224.0  263         This result is from an external source.       Latest Ref Rng & Units 10/15/2020   11:19 AM 06/14/2019   10:57 AM 09/02/2017   12:00 AM  BMP  Glucose 70 - 99 mg/dL 96  106    BUN 6 - 23 mg/dL 17  11  12       Creatinine 0.40 - 1.20 mg/dL 0.88  0.80  0.8      Sodium 135 - 145 mEq/L 140  142  143      Potassium 3.5 - 5.1 mEq/L 3.5  4.1  4.4      Chloride 96 - 112 mEq/L 102  104    CO2 19 - 32 mEq/L 27  31    Calcium 8.4 - 10.5 mg/dL 10.1  9.7       This result is from an external source.    BNP No results found for: "BNP"  ProBNP No results found for: "PROBNP"  PFT    Component Value Date/Time   FEV1PRE 2.33 10/15/2020 0853   FEV1POST 2.40 10/15/2020 0853   FVCPRE 3.15 10/15/2020 0853   FVCPOST 3.11 10/15/2020 0853   TLC 4.75 10/15/2020 0853   DLCOUNC 17.71 10/15/2020 0853   PREFEV1FVCRT 74 10/15/2020 0853   PSTFEV1FVCRT 77 10/15/2020 0853    No results found.   Past medical hx Past Medical History:  Diagnosis Date   Alpha-1-antitrypsin deficiency ([place])    Asthma     History of anemia    Perimenopausal vasomotor symptoms    Postmenopausal    Urinary incontinence      Social History   Tobacco Use   Smoking status: Never   Smokeless tobacco: Never  Substance Use Topics   Alcohol use: Yes    Comment: occasional   Drug use: No  Ms.Serviss reports that she has never smoked. She has never used smokeless tobacco. She reports current alcohol use. She reports that she does not use drugs.  Tobacco Cessation: Counseling given: Not Answered   Past surgical hx, Family hx, Social hx all reviewed.  Current Outpatient Medications on File Prior to Visit  Medication Sig   albuterol (VENTOLIN HFA) 108 (90 Base) MCG/ACT inhaler INHALE 2 PUFFS INTO THE LUNGS EVERY 6 HOURS AS NEEDED FOR ASTHMA   budesonide-formoterol (SYMBICORT) 160-4.5 MCG/ACT inhaler Inhale 2 puffs into the lungs 2 (two) times daily.   ergocalciferol (VITAMIN D2) 1.25 MG (50000 UT) capsule Take 50,000 Units by mouth once a week.    PANTOPRAZOLE SODIUM PO Take 40 mg by mouth daily.   rosuvastatin (CRESTOR) 5 MG tablet 10 mg.   SINGULAIR 10 MG tablet Take 1 tablet by mouth at bedtime.    azithromycin (ZITHROMAX) 250 MG tablet Take as directed   predniSONE (DELTASONE) 20 MG tablet Take 40mg  for 5 days   No current facility-administered medications on file prior to visit.     Allergies  Allergen Reactions   Cefuroxime Axetil Shortness Of Breath   Iodine Shortness Of Breath   Augmentin [Amoxicillin-Pot Clavulanate] Other (See Comments)    Pt c/o tingling in lips   Avelox [Moxifloxacin Hcl In Nacl] Other (See Comments)    Hallucinations    Macrodantin [Nitrofurantoin Macrocrystal] Nausea Only    Chills, aching   Bactrim Rash    Septra   Ceclor [Cefaclor] Rash    Review Of Systems:  Constitutional:   No  weight loss, night sweats,  Fevers, chills, fatigue, or  lassitude.  HEENT:   No headaches,  Difficulty swallowing,  Tooth/dental problems, or  Sore throat,                No  sneezing, itching, ear ache, nasal congestion, post nasal drip,   CV:  No chest pain,  Orthopnea, PND, swelling in lower extremities, anasarca, dizziness, palpitations, syncope.   GI  No heartburn, indigestion, abdominal pain, nausea, vomiting, diarrhea, change in bowel habits, loss of appetite, bloody stools.   Resp: No shortness of breath with exertion or at rest.  No excess mucus, no productive cough,  No non-productive cough,  No coughing up of blood.  No change in color of mucus.  No wheezing.  No chest wall deformity  Skin: no rash or lesions.  GU: no dysuria, change in color of urine, no urgency or frequency.  No flank pain, no hematuria   MS:  No joint pain or swelling.  No decreased range of motion.  No back pain.  Psych:  No change in mood or affect. No depression or anxiety.  No memory loss.   Vital Signs There were no vitals taken for this visit.   Physical Exam:  General- No distress,  A&Ox3 ENT: No sinus tenderness, TM clear, pale nasal mucosa, no oral exudate,no post nasal drip, no LAN Cardiac: S1, S2, regular rate and rhythm, no murmur Chest: No wheeze/ rales/ dullness; no accessory muscle use, no nasal flaring, no sternal retractions Abd.: Soft Non-tender Ext: No clubbing cyanosis, edema Neuro:  normal strength Skin: No rashes, warm and dry Psych: normal mood and behavior   Assessment/Plan  No problem-specific Assessment & Plan notes found for this encounter.    , NP 04/17/2022  11:18 AM

## 2022-05-08 ENCOUNTER — Ambulatory Visit (INDEPENDENT_AMBULATORY_CARE_PROVIDER_SITE_OTHER): Payer: Medicare PPO | Admitting: *Deleted

## 2022-05-08 DIAGNOSIS — M8000XD Age-related osteoporosis with current pathological fracture, unspecified site, subsequent encounter for fracture with routine healing: Secondary | ICD-10-CM | POA: Diagnosis not present

## 2022-05-08 MED ORDER — ROMOSOZUMAB-AQQG 105 MG/1.17ML ~~LOC~~ SOSY
210.0000 mg | PREFILLED_SYRINGE | Freq: Once | SUBCUTANEOUS | Status: AC
  Administered 2022-05-08: 210 mg via SUBCUTANEOUS

## 2022-05-08 NOTE — Progress Notes (Signed)
Patient here for nurse visit for her 12th Evenity. Patient received SQ injections in bilateral arms. Patient tolerated injections well. She will follow up with Dr. Cleophas Dunker.

## 2022-05-13 ENCOUNTER — Encounter: Payer: Self-pay | Admitting: Pulmonary Disease

## 2022-05-13 DIAGNOSIS — J069 Acute upper respiratory infection, unspecified: Secondary | ICD-10-CM

## 2022-05-14 ENCOUNTER — Other Ambulatory Visit: Payer: Self-pay

## 2022-05-14 MED ORDER — AZITHROMYCIN 250 MG PO TABS: ORAL_TABLET | ORAL | 0 refills | Status: DC

## 2022-05-14 MED ORDER — PREDNISONE 10 MG PO TABS: 40.0000 mg | ORAL_TABLET | Freq: Every day | ORAL | 0 refills | Status: AC

## 2022-05-14 NOTE — Progress Notes (Signed)
de

## 2022-05-20 ENCOUNTER — Encounter: Payer: Self-pay | Admitting: Pulmonary Disease

## 2022-05-20 ENCOUNTER — Ambulatory Visit: Payer: Medicare PPO | Admitting: Pulmonary Disease

## 2022-05-20 VITALS — BP 112/66 | HR 84 | Temp 98.0°F | Ht 65.0 in | Wt 166.8 lb

## 2022-05-20 DIAGNOSIS — J069 Acute upper respiratory infection, unspecified: Secondary | ICD-10-CM | POA: Diagnosis not present

## 2022-05-20 DIAGNOSIS — J4541 Moderate persistent asthma with (acute) exacerbation: Secondary | ICD-10-CM

## 2022-05-20 NOTE — Patient Instructions (Signed)
I am glad you are improved with your cough and wheezing and dyspnea Continue the Symbicort for now.  If you continue to be stable in a few months then you can start using it as needed Continue to monitor symptoms Follow-up in 6 months.

## 2022-05-20 NOTE — Progress Notes (Signed)
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  Diagnosed over 10 years ago.    Has been asymptomatic with no inhaler therapy.  She was also evaluated at [facility] and advised that she did not need replacement therapy.  Has history of asthma which appears to be mild.  Previously on Symbicort but currently on albuterol with good control of symptoms  In November 2021 she required prednisone for 5 days after she developed a flareup of asthma after Moderna booster  Pets: No pets Occupation: Retired school principal Exposures: No known exposures.  No mold, hot tub, Jacuzzi Smoking history: Smoker Travel history: Significant travel history Relevant family history: No significant family history of lung disease.  Interim history: Doing well off controller medication but notes some chest heaviness and sinus congestion with wheezing for the past week Denies any cough, sputum production, fevers, chills  Outpatient Encounter Medications as of 05/20/2022  Medication Sig   albuterol (VENTOLIN HFA) 108 (90 Base) MCG/ACT inhaler INHALE 2 PUFFS INTO THE LUNGS EVERY 6 HOURS AS NEEDED FOR ASTHMA   budesonide-formoterol (SYMBICORT) 160-4.5 MCG/ACT inhaler Inhale 2 puffs into the lungs 2 (two) times daily.   ergocalciferol (VITAMIN D2) 1.25 MG (50000 UT) capsule Take 50,000 Units by mouth once a week.    PANTOPRAZOLE SODIUM PO Take 40 mg by mouth daily.   rosuvastatin (CRESTOR) 5 MG tablet 10 mg.   SINGULAIR 10 MG tablet Take 1 tablet by mouth at bedtime.    [DISCONTINUED] azithromycin (ZITHROMAX) 250 MG tablet Take 2 tablets ( 500 mg total) Day 1 Take 1 tablet ( 250 mg)  Day 2 through 5   [DISCONTINUED]  predniSONE (DELTASONE) 10 MG tablet Prednisone taper; 10 mg tablets: 4 tabs x 2 days, 3 tabs x 2 days, 2 tabs x 2 days 1 tab x 2 days then stop.   No facility-administered encounter medications on file as of 05/20/2022.    Physical Exam: Blood pressure 122/60, pulse 90, temperature 98.1 F (36.7 C), temperature source Oral, height 5\' 5"  (1.651 m), weight 168 lb 14.4 oz (76.6 kg), SpO2 99 %. Gen:      No acute distress HEENT:  EOMI, sclera anicteric Neck:     No masses; no thyromegaly Lungs:    Clear to auscultation bilaterally; normal respiratory effort CV:         Regular rate and rhythm; no murmurs Abd:      + bowel sounds; soft, non-tender; no palpable masses, no distension Ext:    No edema; adequate peripheral perfusion Skin:      Warm and dry; no rash Neuro: alert and oriented x 3 Psych: normal mood and affect   Data Reviewed: Imaging: Chest x-ray 04/06/2012-no acute cardiopulmonary abnormality CT abdomen pelvis 05/22/13-visualized lung bases are normal. Chest x-ray 06/14/2019-no acute cardiopulmonary abnormality I have reviewed the images personally.  PFTs: 08/03/2018 FVC 3.29 [95%], FEV1 2.45 [92%], F/F 75, TLC 5.16 [96%], DLCO 14.58 [54%) No obstruction or restriction.  Moderate diffusion impairment  10/15/2020 FVC 3.11 [92%], FEV1 2.40 [93%], F/F 77, TLC 4.75 [88%], DLCO 17.71 [4%] Normal test   Labs: Alpha-1 antitrypsin 06/14/2019-72, PI MZ Hepatic panel 06/14/2019-within normal limits  CBC 06/13/2020-  WBC 4.6, eos 8.8%, absolute eosinophil count 405 CBC 10/15/2020-WBC 11.6, eos 0.6%, absolute eosinophil count 70  Assessment:  Mild asthma She is usually not on controller medication but appears to be having a mild flareup today with chest tightness, wheezing   She will start using the Symbicort that she already has.  She will use 2 puffs twice daily for the next 2 weeks Call in a prescription for prednisone and Z-Pak to be held in reserve in case her symptoms  worsen  Alpha-1 antitrypsin carrier status Alpha-1 levels were low at last assessment.  No obstruction on PFTs Repeat PFTs in 1 year  Plan/Recommendations: - Resume Symbicort - Follow-up PFTs  Kathleen Greathouse MD [facility] 06/25/2020, 8:47 AM

## 2022-05-20 NOTE — Progress Notes (Signed)
Kathleen Hammond    [id number]    26-Jul-1956  Primary Care Physician:Schmitz, Marye Round, MD  Referring Physician: Myra Rude, MD [address],  [place] [address]   Chief complaint: Follow-up for alpha-1 antitrypsin deficiency  HPI: 66 y.o. 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  Diagnosed over 10 years ago.    Has been asymptomatic with no inhaler therapy.  She was also evaluated at [facility] and advised that she did not need replacement therapy.  Has history of asthma which appears to be mild.  Previously on Symbicort but currently on albuterol with good control of symptoms  In November 2021 she required prednisone for 5 days after she developed a flareup of asthma after Moderna booster  Pets: No pets Occupation: Retired school principal Exposures: No known exposures.  No mold, hot tub, Jacuzzi Smoking history: Smoker Travel history: Significant travel history Relevant family history: No significant family history of lung disease.  Interim history: She had been off Symbicort with good control of symptoms.  However over August and September 2023 she required 2 rounds of prednisone and Z-Pak for sinusitis which called mild asthma exacerbation. Overall she feels better and back to baseline.  Outpatient Encounter Medications as of 05/20/2022  Medication Sig   albuterol (VENTOLIN HFA) 108 (90 Base) MCG/ACT inhaler INHALE 2 PUFFS INTO THE LUNGS EVERY 6 HOURS AS NEEDED FOR ASTHMA   budesonide-formoterol (SYMBICORT) 160-4.5 MCG/ACT inhaler Inhale 2 puffs into the lungs 2 (two) times daily.   ergocalciferol (VITAMIN D2) 1.25 MG (50000 UT) capsule Take 50,000 Units by mouth once a week.    PANTOPRAZOLE SODIUM PO Take 40 mg by mouth daily.   rosuvastatin (CRESTOR) 5 MG tablet 10 mg.   SINGULAIR 10 MG tablet Take 1 tablet by mouth at bedtime.    [DISCONTINUED] azithromycin (ZITHROMAX) 250 MG tablet Take 2 tablets ( 500 mg total) Day 1  Take 1 tablet ( 250 mg)  Day 2 through 5   [DISCONTINUED] predniSONE (DELTASONE) 10 MG tablet Prednisone taper; 10 mg tablets: 4 tabs x 2 days, 3 tabs x 2 days, 2 tabs x 2 days 1 tab x 2 days then stop.   No facility-administered encounter medications on file as of 05/20/2022.    Physical Exam: Blood pressure 112/66, pulse 84, temperature 98 F (36.7 C), temperature source Oral, height 5\' 5"  (1.651 m), weight 166 lb 12.8 oz (75.7 kg), SpO2 98 %. Gen:      No acute distress HEENT:  EOMI, sclera anicteric Neck:     No masses; no thyromegaly Lungs:    Clear to auscultation bilaterally; normal respiratory effort CV:         Regular rate and rhythm; no murmurs Abd:      + bowel sounds; soft, non-tender; no palpable masses, no distension Ext:    No edema; adequate peripheral perfusion Skin:      Warm and dry; no rash Neuro: alert and oriented x 3 Psych: normal mood and affect   Data Reviewed: Imaging: Chest x-ray 04/06/2012-no acute cardiopulmonary abnormality CT abdomen pelvis 05/22/13-visualized lung bases are normal. Chest x-ray 06/14/2019-no acute cardiopulmonary abnormality I have reviewed the images personally.  PFTs: 08/03/2018 FVC 3.29 [95%], FEV1 2.45 [92%], F/F 75, TLC 5.16 [96%], DLCO 14.58 [54%) No obstruction or restriction.  Moderate diffusion impairment  10/15/2020 FVC 3.11 [92%], FEV1 2.40 [93%], F/F 77, TLC 4.75 [88%], DLCO 17.71 [4%] Normal test  Labs: Alpha-1 antitrypsin 06/14/2019-72, PI MZ Hepatic panel 06/14/2019-within normal limits  CBC 06/13/2020- WBC 4.6, eos 8.8%, absolute eosinophil count 405 CBC 10/15/2020-WBC 11.6, eos 0.6%, absolute eosinophil count 70  Assessment:  Mild asthma with exacerbation She has had a flareup over the past 2 months set off by rhinosinusitis requiring 2 rounds of prednisone and Z-Pak. Overall improved and back to baseline I have asked her to continue the Symbicort for now.  I am not sure if she will need this long-term as her  symptoms were previously well controlled If symptoms are stable in the next few months then she will switch to using Symbicort as needed  Alpha-1 antitrypsin carrier status Alpha-1 levels were low at last assessment.  No obstruction on PFTs Repeat PFTs in 1 year  Plan/Recommendations: -Continue Symbicort for now Follow-up in 6 months  Marshell Garfinkel MD [facility] 06/25/2020, 8:47 AM

## 2022-05-21 NOTE — Telephone Encounter (Signed)
Evenity series completed. Transfer to Prolia?

## 2022-05-25 ENCOUNTER — Ambulatory Visit
Admission: RE | Admit: 2022-05-25 | Discharge: 2022-05-25 | Disposition: A | Discharge status: Home / Self Care | Payer: Medicare PPO | Source: Ambulatory Visit

## 2022-05-25 DIAGNOSIS — Z1231 Encounter for screening mammogram for malignant neoplasm of breast: Secondary | ICD-10-CM | POA: Diagnosis not present

## 2022-05-27 ENCOUNTER — Telehealth: Payer: Self-pay | Admitting: *Deleted

## 2022-05-27 NOTE — Telephone Encounter (Signed)
error 

## 2022-06-16 DIAGNOSIS — M810 Age-related osteoporosis without current pathological fracture: Secondary | ICD-10-CM | POA: Diagnosis not present

## 2022-06-16 DIAGNOSIS — E559 Vitamin D deficiency, unspecified: Secondary | ICD-10-CM | POA: Diagnosis not present

## 2022-07-07 DIAGNOSIS — Z961 Presence of intraocular lens: Secondary | ICD-10-CM | POA: Diagnosis not present

## 2022-07-07 DIAGNOSIS — H04123 Dry eye syndrome of bilateral lacrimal glands: Secondary | ICD-10-CM | POA: Diagnosis not present

## 2022-07-07 DIAGNOSIS — H10413 Chronic giant papillary conjunctivitis, bilateral: Secondary | ICD-10-CM | POA: Diagnosis not present

## 2022-07-29 DIAGNOSIS — E782 Mixed hyperlipidemia: Secondary | ICD-10-CM | POA: Diagnosis not present

## 2022-07-29 DIAGNOSIS — E559 Vitamin D deficiency, unspecified: Secondary | ICD-10-CM | POA: Diagnosis not present

## 2022-08-03 ENCOUNTER — Encounter (INDEPENDENT_AMBULATORY_CARE_PROVIDER_SITE_OTHER): Payer: Medicare PPO | Admitting: Ophthalmology

## 2022-08-03 DIAGNOSIS — H353132 Nonexudative age-related macular degeneration, bilateral, intermediate dry stage: Secondary | ICD-10-CM | POA: Diagnosis not present

## 2022-08-05 DIAGNOSIS — K589 Irritable bowel syndrome without diarrhea: Secondary | ICD-10-CM | POA: Diagnosis not present

## 2022-08-05 DIAGNOSIS — K219 Gastro-esophageal reflux disease without esophagitis: Secondary | ICD-10-CM | POA: Diagnosis not present

## 2022-08-05 DIAGNOSIS — M722 Plantar fascial fibromatosis: Secondary | ICD-10-CM | POA: Diagnosis not present

## 2022-08-05 DIAGNOSIS — H43811 Vitreous degeneration, right eye: Secondary | ICD-10-CM | POA: Diagnosis not present

## 2022-08-05 DIAGNOSIS — E8801 Alpha-1-antitrypsin deficiency: Secondary | ICD-10-CM | POA: Diagnosis not present

## 2022-08-05 DIAGNOSIS — E559 Vitamin D deficiency, unspecified: Secondary | ICD-10-CM | POA: Diagnosis not present

## 2022-08-05 DIAGNOSIS — M810 Age-related osteoporosis without current pathological fracture: Secondary | ICD-10-CM | POA: Diagnosis not present

## 2022-08-05 DIAGNOSIS — M75101 Unspecified rotator cuff tear or rupture of right shoulder, not specified as traumatic: Secondary | ICD-10-CM | POA: Diagnosis not present

## 2022-08-05 DIAGNOSIS — E782 Mixed hyperlipidemia: Secondary | ICD-10-CM | POA: Diagnosis not present

## 2022-09-01 DIAGNOSIS — K5792 Diverticulitis of intestine, part unspecified, without perforation or abscess without bleeding: Secondary | ICD-10-CM | POA: Diagnosis not present

## 2022-09-01 DIAGNOSIS — R197 Diarrhea, unspecified: Secondary | ICD-10-CM | POA: Diagnosis not present

## 2022-10-08 DIAGNOSIS — Z01419 Encounter for gynecological examination (general) (routine) without abnormal findings: Secondary | ICD-10-CM | POA: Diagnosis not present

## 2022-10-08 DIAGNOSIS — Z6827 Body mass index (BMI) 27.0-27.9, adult: Secondary | ICD-10-CM | POA: Diagnosis not present

## 2022-10-13 ENCOUNTER — Other Ambulatory Visit: Payer: Self-pay | Admitting: Obstetrics and Gynecology

## 2022-10-13 DIAGNOSIS — Z8249 Family history of ischemic heart disease and other diseases of the circulatory system: Secondary | ICD-10-CM

## 2022-10-20 ENCOUNTER — Other Ambulatory Visit: Payer: Self-pay | Admitting: *Deleted

## 2022-10-20 DIAGNOSIS — E8801 Alpha-1-antitrypsin deficiency: Secondary | ICD-10-CM

## 2022-10-20 DIAGNOSIS — J4541 Moderate persistent asthma with (acute) exacerbation: Secondary | ICD-10-CM

## 2022-10-21 ENCOUNTER — Telehealth: Payer: Self-pay | Admitting: Pulmonary Disease

## 2022-10-21 MED ORDER — BUDESONIDE-FORMOTEROL FUMARATE 160-4.5 MCG/ACT IN AERO: 2.0000 | INHALATION_SPRAY | Freq: Two times a day (BID) | RESPIRATORY_TRACT | 0 refills | Status: DC

## 2022-10-21 NOTE — Telephone Encounter (Signed)
PT calling for a Zpac and Pred. She is having issues w/ her  Asthma. Has FU appt w/Dr. Vaughan Browner and PFT sched. Her # is [phone]

## 2022-10-21 NOTE — Telephone Encounter (Signed)
Called and spoke with patient. She stated that she believes she has a sinus infection. She has noticed an increase in facial pressure, headaches and runny nose. She has not been around anyone who has been sick recently. Denies any fever or chills.   She is still using her Singulair and Symbicort. She requested a refill on the Symbicort to last until her appt. This has been sent in.   She is requesting a zpak and prednisone as Dr. Vaughan Browner has prescribed for her in the past for her sinus infections.   Pharmacy is [facility] in [place].   Judson Roch, can you please advise Dr. Vaughan Browner is not available this afternoon? Thanks!

## 2022-10-21 NOTE — Telephone Encounter (Signed)
Also wants Symbicort called in. (Sorry- Forgot to ask Pharm)

## 2022-10-21 NOTE — Telephone Encounter (Signed)
Called patient but she did not answer. Left message for her to call us back.  

## 2022-10-22 MED ORDER — PREDNISONE 10 MG PO TABS: ORAL_TABLET | ORAL | 0 refills | Status: AC

## 2022-10-22 MED ORDER — AZITHROMYCIN 250 MG PO TABS: ORAL_TABLET | ORAL | 0 refills | Status: DC

## 2022-10-22 NOTE — Telephone Encounter (Signed)
PT said only inhaler was called in. Still waiting for the Zpack and Pred Taper. Pls call PT to advise @ [phone]

## 2022-10-22 NOTE — Telephone Encounter (Signed)
Called and spoke with patient. She verbalized understanding. Appointment has been made for patient and prescriptions have been sent to the patients pharmacy.   Nothing further needed.

## 2022-10-30 ENCOUNTER — Ambulatory Visit: Payer: Medicare PPO | Admitting: Pulmonary Disease

## 2022-11-03 ENCOUNTER — Encounter: Payer: Self-pay | Admitting: Adult Health

## 2022-11-03 ENCOUNTER — Ambulatory Visit: Payer: Medicare PPO | Admitting: Adult Health

## 2022-11-03 VITALS — BP 108/64 | HR 99 | Temp 98.1°F | Ht 66.0 in | Wt 172.0 lb

## 2022-11-03 DIAGNOSIS — J301 Allergic rhinitis due to pollen: Secondary | ICD-10-CM | POA: Diagnosis not present

## 2022-11-03 DIAGNOSIS — J4531 Mild persistent asthma with (acute) exacerbation: Secondary | ICD-10-CM | POA: Diagnosis not present

## 2022-11-03 DIAGNOSIS — Z148 Genetic carrier of other disease: Secondary | ICD-10-CM

## 2022-11-03 DIAGNOSIS — J309 Allergic rhinitis, unspecified: Secondary | ICD-10-CM | POA: Insufficient documentation

## 2022-11-03 DIAGNOSIS — J45909 Unspecified asthma, uncomplicated: Secondary | ICD-10-CM | POA: Insufficient documentation

## 2022-11-03 NOTE — Assessment & Plan Note (Signed)
Continue on singular daily.  Add in Claritin and Flonase daily as needed

## 2022-11-03 NOTE — Progress Notes (Signed)
$'@Patient'z$  ID: Kathleen Hammond, female    DOB: 10/07/1955, 67 y.o.   MRN: [id number]  Chief Complaint  Patient presents with   Follow-up    Referring provider: Celene Squibb, MD  HPI: 67 year old female never smoker followed for asthma.  Medical history significant for alpha 1 antitrypsin carrier (previous evaluation at [facility] felt not to need replacement therapy)  TEST/EVENTS :  Pets: No pets Occupation: Retired Programmer, multimedia, Professor at [employer]  Exposures: No known exposures.  No mold, hot tub, Jacuzzi Smoking history: Smoker Travel history: Significant travel history Relevant family history: No significant family history of lung disease.  Alpha-1 antitrypsin 06/14/2019-72, PI MZ Hepatic panel 06/14/2019-within normal limits   CBC 06/13/2020- WBC 4.6, eos 8.8%, absolute eosinophil count 405 CBC 10/15/2020-WBC 11.6, eos 0.6%, absolute eosinophil count 70  11/03/2022 Follow up : Asthma  Patient returns for follow-up visit.  Patient was treated for a asthmatic bronchitic flare.  Patient called in 2 weeks ago with URI symptoms with nasal congestion cough, wheezing  and  sinus pressure.  She was called in a Z-Pak and a prednisone taper.  Patient says since last visit she is feeling much better.  She did take her Symbicort Twice daily  during sickness. At baseline only use Symbicort As needed  .   She denies any cough and wheezing.    Does have albuterol to use as needed. Says she typically has asthma flare few times a year requiring steroids . We discussed trigger control/prevention .  Currently following with GI , on Cipro for Diverticultiis . Feels symptoms are improving .      Allergies  Allergen Reactions   Cefuroxime Axetil Shortness Of Breath   Iodine Shortness Of Breath   Augmentin [Amoxicillin-Pot Clavulanate] Other (See Comments)    Pt c/o tingling in lips   Avelox [Moxifloxacin Hcl In Nacl] Other (See Comments)    Hallucinations    Macrodantin [Nitrofurantoin  Macrocrystal] Nausea Only    Chills, aching   Bactrim Rash    Septra   Ceclor [Cefaclor] Rash    Immunization History  Administered Date(s) Administered   Influenza Split 08/22/2015   Influenza-Unspecified 07/08/2016, 06/17/2017, 07/13/2018   Moderna Sars-Covid-2 Vaccination 08/30/2019, 09/27/2019, 06/20/2020   Td 08/28/2011, 11/19/2015    Past Medical History:  Diagnosis Date   Alpha-1-antitrypsin deficiency ([place])    Asthma    History of anemia    Perimenopausal vasomotor symptoms    Postmenopausal    Urinary incontinence     Tobacco History: Social History   Tobacco Use  Smoking Status Never  Smokeless Tobacco Never   Counseling given: Not Answered   Outpatient Medications Prior to Visit  Medication Sig Dispense Refill   albuterol (VENTOLIN HFA) 108 (90 Base) MCG/ACT inhaler INHALE 2 PUFFS INTO THE LUNGS EVERY 6 HOURS AS NEEDED FOR ASTHMA 18 g 5   azithromycin (ZITHROMAX) 250 MG tablet Take as directed 6 tablet 0   budesonide-formoterol (SYMBICORT) 160-4.5 MCG/ACT inhaler Inhale 2 puffs into the lungs 2 (two) times daily. 10.2 g 0   ciprofloxacin (CIPRO) 500 MG tablet Take 500 mg by mouth 2 (two) times daily.     ergocalciferol (VITAMIN D2) 1.25 MG (50000 UT) capsule Take 50,000 Units by mouth once a week.      PANTOPRAZOLE SODIUM PO Take 40 mg by mouth daily.     rosuvastatin (CRESTOR) 5 MG tablet 10 mg.     SINGULAIR 10 MG tablet Take 1 tablet by mouth at bedtime.  No facility-administered medications prior to visit.     Review of Systems:   Constitutional:   No  weight loss, night sweats,  Fevers, chills, fatigue, or  lassitude.  HEENT:   No headaches,  Difficulty swallowing,  Tooth/dental problems, or  Sore throat,                No sneezing, itching, ear ache,  +nasal congestion, post nasal drip,   CV:  No chest pain,  Orthopnea, PND, swelling in lower extremities, anasarca, dizziness, palpitations, syncope.   GI  No heartburn, indigestion,  abdominal pain, nausea, vomiting, diarrhea, change in bowel habits, loss of appetite, bloody stools.   Resp:   No chest wall deformity  Skin: no rash or lesions.  GU: no dysuria, change in color of urine, no urgency or frequency.  No flank pain, no hematuria   MS:  No joint pain or swelling.  No decreased range of motion.  No back pain.    Physical Exam  BP 108/64   Pulse 99   Temp 98.1 F (36.7 C) (Oral)   Ht '5\' 6"'$  (1.676 m)   Wt 172 lb (78 kg)   SpO2 99%   BMI 27.76 kg/m   GEN: A/Ox3; pleasant , NAD, well nourished    HEENT:  [place]/AT,   NOSE-clear, THROAT-clear, no lesions, no postnasal drip or exudate noted.   NECK:  Supple w/ fair ROM; no JVD; normal carotid impulses w/o bruits; no thyromegaly or nodules palpated; no lymphadenopathy.    RESP  Clear  P & A; w/o, wheezes/ rales/ or rhonchi. no accessory muscle use, no dullness to percussion  CARD:  RRR, no m/r/g, no peripheral edema, pulses intact, no cyanosis or clubbing.  GI:   Soft & nt; nml bowel sounds; no organomegaly or masses detected.   Musco: Warm bil, no deformities or joint swelling noted.   Neuro: alert, no focal deficits noted.    Skin: Warm, no lesions or rashes    Lab Results:    BNP No results found for: "BNP"  ProBNP No results found for: "PROBNP"  Imaging: No results found.       Latest Ref Rng & Units 10/15/2020    8:53 AM 08/03/2018    8:14 AM 11/26/2013   10:02 AM 11/21/2013   10:13 AM  PFT Results  FVC-Pre L 3.15  3.18  3.15  3.14   FVC-Predicted Pre % 94  92  90  88   FVC-Post L 3.11  3.29  3.16  3.23   FVC-Predicted Post % 92  95  90  90   Pre FEV1/FVC % % 74  74  72  75   Post FEV1/FCV % % 77  75  78  76   FEV1-Pre L 2.33  2.35  2.27  2.35   FEV1-Predicted Pre % 91  89  84  85   FEV1-Post L 2.40  2.45  2.46  2.45   DLCO uncorrected ml/min/mmHg 17.71  14.58  16.15  18.28   DLCO UNC% % 84  54  59  67   DLCO corrected ml/min/mmHg 17.71    18.28   DLCO COR %Predicted % 84     67   DLVA Predicted % 98  65  76  86   TLC L 4.75  5.16  5.41  4.79   TLC % Predicted % 88  96  101  89   RV % Predicted % 78  94  105  83     No results found for: "NITRICOXIDE"      Assessment & Plan:   Asthma Mild persistent asthma with recent exacerbation now resolved.  We discussed asthma action plan.  Patient does have 2-3 exacerbations a year requiring steroids.  Have discussed patient starting on Symbicort at least 1 puff twice daily.  During times of increased symptoms or flare would recommend going up to 2 puffs twice daily of Symbicort.  Also discussed starting on Claritin 10 mg daily as needed.  Along with Flonase if needed.  She is to continue on Singulair. Previous PFTs in past have been normal.  Will repeat PFTs on return Plan  Patient Instructions  Use Symbicort 1-2 puffs Twice daily , rinse after use.  Asthma action plan  Albuterol inhaler As needed   Continue on Singulair daily  Claritin '10mg'$  daily As needed   Flonase 2 puffs daily As needed   Saline nasal spray Twice daily  As needed   Activity as tolerated.  Follow up with Dr. Vaughan Browner in 2 months with PFT and As needed       Allergic rhinitis Continue on singular daily.  Add in Claritin and Flonase daily as needed  Alpha-1-antitrypsin deficiency carrier Previous workup with [facility] felt not to need replacement therapy.  Previous PFTs have showed normal lung function.  PFTs are pending at next visit     Rexene Edison, NP 11/03/2022

## 2022-11-03 NOTE — Assessment & Plan Note (Signed)
Previous workup with [facility] felt not to need replacement therapy.  Previous PFTs have showed normal lung function.  PFTs are pending at next visit

## 2022-11-03 NOTE — Patient Instructions (Addendum)
Use Symbicort 1-2 puffs Twice daily , rinse after use.  Asthma action plan  Albuterol inhaler As needed   Continue on Singulair daily  Claritin '10mg'$  daily As needed   Flonase 2 puffs daily As needed   Saline nasal spray Twice daily  As needed   Activity as tolerated.  Follow up with Dr. Vaughan Browner in 2 months with PFT and As needed

## 2022-11-03 NOTE — Assessment & Plan Note (Signed)
Mild persistent asthma with recent exacerbation now resolved.  We discussed asthma action plan.  Patient does have 2-3 exacerbations a year requiring steroids.  Have discussed patient starting on Symbicort at least 1 puff twice daily.  During times of increased symptoms or flare would recommend going up to 2 puffs twice daily of Symbicort.  Also discussed starting on Claritin 10 mg daily as needed.  Along with Flonase if needed.  She is to continue on Singulair. Previous PFTs in past have been normal.  Will repeat PFTs on return Plan  Patient Instructions  Use Symbicort 1-2 puffs Twice daily , rinse after use.  Asthma action plan  Albuterol inhaler As needed   Continue on Singulair daily  Claritin '10mg'$  daily As needed   Flonase 2 puffs daily As needed   Saline nasal spray Twice daily  As needed   Activity as tolerated.  Follow up with Dr. Vaughan Browner in 2 months with PFT and As needed

## 2022-11-13 ENCOUNTER — Ambulatory Visit
Admission: RE | Admit: 2022-11-13 | Discharge: 2022-11-13 | Disposition: A | Discharge status: Home / Self Care | Payer: No Typology Code available for payment source | Source: Ambulatory Visit | Attending: Obstetrics and Gynecology | Admitting: Obstetrics and Gynecology

## 2022-11-13 DIAGNOSIS — Z8249 Family history of ischemic heart disease and other diseases of the circulatory system: Secondary | ICD-10-CM

## 2022-12-02 ENCOUNTER — Ambulatory Visit: Payer: Medicare PPO | Admitting: Family Medicine

## 2022-12-02 VITALS — BP 130/80 | Ht 66.0 in | Wt 170.0 lb

## 2022-12-02 DIAGNOSIS — M810 Age-related osteoporosis without current pathological fracture: Secondary | ICD-10-CM | POA: Diagnosis not present

## 2022-12-02 NOTE — Patient Instructions (Signed)
Get labs done at [facility] - we will call you with results. You are due for Prolia after 4/24 - we will make sure this has approval and call you to schedule.

## 2022-12-03 NOTE — Progress Notes (Signed)
PCP: Benita Stabile, MD  Subjective:   HPI: Patient is a 67 y.o. female here for osteoporosis.  Patient comes in today for [place] to assume her osteoporosis care.  She completed evenity last year and had 1 dose of prolia after this, no side effects.  Interested in continuing this treatment. Prior treatment: fosamax - caused dyspnea; evenity x1 year, prolia - given 06/16/22 History of Hip, Spine, or Wrist Fracture: no Heart disease or stroke: no Cancer: no Kidney Disease: no Gastric/Peptic Ulcer: no Gastric bypass surgery: no Severe GERD: not severe but has hiatal hernia History of seizures: no Age at Menopause: 21 Calcium intake: 1200mg  Vitamin D intake: 50,000 IU weekly - told to do this since last vitamin D was in the 20s Hormone replacement therapy: no Smoking history: never Alcohol: no Exercise: no Major dental work in past year: no Parents with hip/spine fracture: yes - mother had multiple spine fractures  Past Medical History:  Diagnosis Date   Alpha-1-antitrypsin deficiency ([facility])    Asthma    History of anemia    Perimenopausal vasomotor symptoms    Postmenopausal    Urinary incontinence     Current Outpatient Medications on File Prior to Visit  Medication Sig Dispense Refill   albuterol (VENTOLIN HFA) 108 (90 Base) MCG/ACT inhaler INHALE 2 PUFFS INTO THE LUNGS EVERY 6 HOURS AS NEEDED FOR ASTHMA 18 g 5   azithromycin (ZITHROMAX) 250 MG tablet Take as directed 6 tablet 0   budesonide-formoterol (SYMBICORT) 160-4.5 MCG/ACT inhaler Inhale 2 puffs into the lungs 2 (two) times daily. 10.2 g 0   ergocalciferol (VITAMIN D2) 1.25 MG (50000 UT) capsule Take 50,000 Units by mouth once a week.      PANTOPRAZOLE SODIUM PO Take 40 mg by mouth daily.     rosuvastatin (CRESTOR) 5 MG tablet 10 mg.     SINGULAIR 10 MG tablet Take 1 tablet by mouth at bedtime.      No current facility-administered medications on file prior to visit.    Past Surgical History:  Procedure Laterality  Date   BREAST EXCISIONAL BIOPSY Right 1996   CATARACT EXTRACTION Right 05/2014   [facility]   CATARACT EXTRACTION Left 05/2014   [facility]   CESAREAN SECTION     CHOLECYSTECTOMY     cyst removed from the gum     DILATION AND CURETTAGE OF UTERUS     GALLBLADDER SURGERY     lump removed from the breast     TUBAL LIGATION     YAG LASER APPLICATION Left 2016   [facility]   YAG LASER APPLICATION Right 2016   [facility]    Allergies  Allergen Reactions   Cefuroxime Axetil Shortness Of Breath   Iodine Shortness Of Breath   Augmentin [Amoxicillin-Pot Clavulanate] Other (See Comments)    Pt c/o tingling in lips   Avelox [Moxifloxacin Hcl In Nacl] Other (See Comments)    Hallucinations    Macrodantin [Nitrofurantoin Macrocrystal] Nausea Only    Chills, aching   Bactrim Rash    Septra   Ceclor [Cefaclor] Rash    BP 130/80   Ht 5\' 6"  (1.676 m)   Wt 170 lb (77.1 kg)   BMI 27.44 kg/m       No data to display              No data to display              Objective:  Physical Exam:  Gen: NAD, comfortable in exam  room  Last labs from July 2023 - nothing more recent.  Dexa 04/07/22: R fem neck -2.4; L fem neck -2.4; Spine -1.4   Assessment & Plan:  1. Osteoporosis - completed evenity and started prolia - due for next prolia on or after 12/17/22 - will get approval for this.  She will have a BMP and 25-OH Vit D checked - last Vit D was low at 22 back in July and she's been on 50,000 IU weekly since.  Admits not always taking her calcium and needs to be better about this.  Encourage weight bearing exercise as well.  Total visit time 30 minutes including documentation.

## 2022-12-05 ENCOUNTER — Telehealth: Payer: Self-pay

## 2022-12-05 NOTE — Telephone Encounter (Signed)
From: Annita Brod, CMA  Sent: 12/02/2022   3:28 PM EDT  To: Lenda Kelp, MD; Dierdre Searles, CMA; *   Geovonni Meyerhoff   Go ahead and do this pts VOB for her prolia....   She is due to have her next injection on 4/24   thx

## 2022-12-05 NOTE — Telephone Encounter (Signed)
Prolia VOB initiated via [email or web address]  Transfer from [place]  Next Prolia inj DUE: 12/16/22

## 2022-12-07 ENCOUNTER — Encounter: Payer: Self-pay | Admitting: *Deleted

## 2022-12-07 DIAGNOSIS — M810 Age-related osteoporosis without current pathological fracture: Secondary | ICD-10-CM | POA: Diagnosis not present

## 2022-12-08 LAB — BASIC METABOLIC PANEL
BUN/Creatinine Ratio: 17 (ref 12–28)
BUN: 15 mg/dL (ref 8–27)
CO2: 26 mmol/L (ref 20–29)
Calcium: 9.6 mg/dL (ref 8.7–10.3)
Chloride: 102 mmol/L (ref 96–106)
Creatinine, Ser: 0.9 mg/dL (ref 0.57–1.00)
Glucose: 91 mg/dL (ref 70–99)
Potassium: 4.3 mmol/L (ref 3.5–5.2)
Sodium: 141 mmol/L (ref 134–144)
eGFR: 69 mL/min/{1.73_m2} (ref >59)

## 2022-12-08 LAB — VITAMIN D 25 HYDROXY (VIT D DEFICIENCY, FRACTURES): Vit D, 25-Hydroxy: 53.2 ng/mL (ref 30.0–100.0)

## 2022-12-11 NOTE — Telephone Encounter (Signed)
Annita Brod, CMA  Dierdre Searles, [place] Gearldine Bienenstock can we see if the pending has finalized on her VOB?  THX

## 2022-12-11 NOTE — Telephone Encounter (Signed)
Prior auth required for PROLIA  PA PROCESS DETAILS: PA is required. PA can be initiated by calling [phone] or online at [email or web address] 

## 2022-12-16 ENCOUNTER — Ambulatory Visit: Payer: Medicare PPO | Admitting: Pulmonary Disease

## 2022-12-16 NOTE — Telephone Encounter (Signed)
Prior Authorization initiated for PROLIA via [email or web address] KEY: [id number]

## 2022-12-18 NOTE — Telephone Encounter (Signed)
APPROVED  Key: [id number] PA Case ID #: [id number] Valid: 12/17/22-08/24/23

## 2022-12-21 ENCOUNTER — Ambulatory Visit: Payer: Medicare PPO | Admitting: Cardiology

## 2022-12-21 ENCOUNTER — Ambulatory Visit: Payer: Medicare PPO | Attending: Cardiology | Admitting: Cardiology

## 2022-12-21 ENCOUNTER — Encounter: Payer: Self-pay | Admitting: Cardiology

## 2022-12-21 VITALS — BP 126/64 | HR 79 | Ht 66.0 in | Wt 169.0 lb

## 2022-12-21 DIAGNOSIS — E7800 Pure hypercholesterolemia, unspecified: Secondary | ICD-10-CM | POA: Diagnosis not present

## 2022-12-21 DIAGNOSIS — I2510 Atherosclerotic heart disease of native coronary artery without angina pectoris: Secondary | ICD-10-CM | POA: Diagnosis not present

## 2022-12-21 DIAGNOSIS — I2584 Coronary atherosclerosis due to calcified coronary lesion: Secondary | ICD-10-CM | POA: Diagnosis not present

## 2022-12-21 DIAGNOSIS — H903 Sensorineural hearing loss, bilateral: Secondary | ICD-10-CM | POA: Diagnosis not present

## 2022-12-21 NOTE — Patient Instructions (Signed)
Medication Instructions:  Continue same medications  Lab Work: None ordered   Testing/Procedures: None ordered   Follow-Up: At [facility], you and your health needs are our priority.  As part of our continuing mission to provide you with exceptional heart care, we have created designated Provider Care Teams.  These Care Teams include your primary Cardiologist (physician) and Advanced Practice Providers (APPs -  Physician Assistants and Nurse Practitioners) who all work together to provide you with the care you need, when you need it.  We recommend signing up for the patient portal called "MyChart".  Sign up information is provided on this After Visit Summary.  MyChart is used to connect with patients for Virtual Visits (Telemedicine).  Patients are able to view lab/test results, encounter notes, upcoming appointments, etc.  Non-urgent messages can be sent to your provider as well.   To learn more about what you can do with MyChart, go to [email or web address].    Your next appointment:  As Needed    Provider: Dr.Jordan   

## 2022-12-21 NOTE — Progress Notes (Signed)
Cardiology Office Note:    Date:  12/21/2022   ID:  Kathleen Hammond, DOB [birth year], MRN [id number]  PCP:  Benita Stabile, MD   [facility] Providers Cardiologist:  None     Referring MD: Benita Stabile, MD   Chief Complaint  Patient presents with   Coronary Artery Disease    History of Present Illness:    Kathleen Hammond is a 67 y.o. female is seen at the request of Dr Margo Aye for evaluation of coronary calcification. She has a history of HLD and family history of CAD with father dying of MI at age 41. She has been in very good health. On statin. No history of chest pain. Does have mild dyspnea related to asthma and alpha 1 antitrypsin deficiency. She had a recent coronary calcium score of 1.6. in the proximal LAD  Past Medical History:  Diagnosis Date   Alpha-1-antitrypsin deficiency (HCC)    Asthma    History of anemia    Hyperlipidemia    Irritable bowel    Osteoporosis    Perimenopausal vasomotor symptoms    Postmenopausal    Urinary incontinence     Past Surgical History:  Procedure Laterality Date   BREAST EXCISIONAL BIOPSY Right 1996   CATARACT EXTRACTION Right 05/2014   [place]   CATARACT EXTRACTION Left 05/2014   [facility]   CESAREAN SECTION     CHOLECYSTECTOMY     cyst removed from the gum     DILATION AND CURETTAGE OF UTERUS     GALLBLADDER SURGERY     lump removed from the breast     TUBAL LIGATION     YAG LASER APPLICATION Left 2016   [facility]   YAG LASER APPLICATION Right 2016   [facility]    Current Medications: Current Meds  Medication Sig   albuterol (VENTOLIN HFA) 108 (90 Base) MCG/ACT inhaler INHALE 2 PUFFS INTO THE LUNGS EVERY 6 HOURS AS NEEDED FOR ASTHMA   azithromycin (ZITHROMAX) 250 MG tablet Take as directed   budesonide-formoterol (SYMBICORT) 160-4.5 MCG/ACT inhaler Inhale 2 puffs into the lungs 2 (two) times daily.   ergocalciferol (VITAMIN D2) 1.25 MG (50000 UT) capsule Take 50,000 Units by mouth once a week.    PANTOPRAZOLE SODIUM PO Take 40 mg  by mouth daily.   rosuvastatin (CRESTOR) 5 MG tablet 10 mg.   SINGULAIR 10 MG tablet Take 1 tablet by mouth at bedtime.      Allergies:   Cefuroxime axetil, Iodine, Augmentin [amoxicillin-pot clavulanate], Avelox [moxifloxacin hcl in nacl], Macrodantin [nitrofurantoin macrocrystal], Bactrim, and Ceclor [cefaclor]   Social History   Socioeconomic History   Marital status: Married    Spouse name: Not on file   Number of children: 3   Years of education: Not on file   Highest education level: Not on file  Occupational History   Not on file  Tobacco Use   Smoking status: Never   Smokeless tobacco: Never  Substance and Sexual Activity   Alcohol use: Yes    Comment: occasional   Drug use: No   Sexual activity: Not on file  Other Topics Concern   Not on file  Social History Narrative   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         Social Determinants of Health   Financial Resource Strain: Not on file  Food Insecurity: Not on file  Transportation Needs: Not on file  Physical Activity: Not on file  Stress: Not on file  Social Connections: Not on file     Family History: The patient's family history includes Anemia in her mother; Cancer in her mother; Heart attack (age of onset: 82) in her father; Heart disease in an other family member; Hypertension in her father, sister, and another family member; Pneumonia in her mother; Stroke in an other family member.  ROS:   Please see the history of present illness.     All other systems reviewed and are negative.  EKGs/Labs/Other Studies Reviewed:    The following studies were reviewed today: CT CARDIAC CORONARY ARTERY CALCIUM SCORE   TECHNIQUE: Non-contrast imaging through the heart was performed using prospective ECG gating. Image post processing was performed on an independent workstation, allowing for quantitative analysis of the heart and coronary arteries. Note that this exam targets the  heart and the chest was not imaged in its entirety.   COMPARISON:  None available.   FINDINGS: CORONARY CALCIUM SCORES:   Left Main: 0   LAD: 1.6   LCx: 0   RCA: 0   Total Agatston Score: 1.6   MESA database percentile: 43   AORTA MEASUREMENTS:   Ascending Aorta: 3.1 cm   Descending Aorta:2.6 cm   OTHER FINDINGS:   Vascular: Normal heart size. No pericardial effusion. Normal caliber thoracic aorta with mild atherosclerotic disease.   Mediastinum/Nodes: Small hiatal hernia. No pathologically enlarged lymph nodes seen in the chest.   Lungs/Pleura: Central airways are patent. No consolidation, pleural effusion or pneumothorax.   Upper Abdomen: No acute abnormality.   Musculoskeletal: No chest wall mass or suspicious bone lesions identified.   IMPRESSION: 1. Total calcium score of 1.6 is at percentile 43 for subjects of the same age, gender, and race/ethnicity. 2. Aortic Atherosclerosis (ICD10-I70.0).     Electronically Signed   By: Allegra Lai M.D.   On: 11/16/2022 13:26        EKG:  EKG is  ordered today.  The ekg ordered today demonstrates NSR rate 79. Mild nonspecific TWA. I have personally reviewed and interpreted this study.   Recent Labs: 12/07/2022: BUN 15; Creatinine, Ser 0.90; Potassium 4.3; Sodium 141  Recent Lipid Panel    Component Value Date/Time   CHOL 174 05/12/2018 0000   TRIG 164 (A) 05/12/2018 0000   HDL 50 05/12/2018 0000   LDLCALC 91 05/12/2018 0000   Dated 12/23: cholesterol 169, triglycerides 124, HDL 64, LDL 83. CMET and CBC normal.  Risk Assessment/Calculations:                Physical Exam:    VS:  BP 126/64   Pulse 79   Ht 5\' 6"  (1.676 m)   Wt 169 lb (76.7 kg)   SpO2 97%   BMI 27.28 kg/m     Wt Readings from Last 3 Encounters:  12/21/22 169 lb (76.7 kg)  12/02/22 170 lb (77.1 kg)  11/03/22 172 lb (78 kg)     GEN:  Well nourished, well developed in no acute distress HEENT: Normal NECK: No JVD; No  carotid bruits LYMPHATICS: No lymphadenopathy CARDIAC: RRR, no murmurs, rubs, gallops RESPIRATORY:  Clear to auscultation without rales, wheezing or rhonchi  ABDOMEN: Soft, non-tender, non-distended MUSCULOSKELETAL:  No edema; No deformity  SKIN: Warm and dry NEUROLOGIC:  Alert and oriented x 3 PSYCHIATRIC:  Normal affect   ASSESSMENT:    1. Coronary artery calcification   2. Hypercholesteremia    PLAN:    In order of problems listed above:  Coronary artery calcification. Very  low calcium score of 1.6. risk factors of HLD and family history of premature CAD. No active cardiac symptoms. Recommend lifestyle modification with heart healthy diet and regular aerobic activity. Focus on lipid lowering therapy with goal LDL < 70. She will follow up with Dr Margo Aye for this. No further ischemic work up indicated at this time given lack of symptoms. Can follow up PRN HLD on Crestor 10 mg. Goal LDL < 70. States when labs last done was not taking medication regularly. Will do so now and follow up with Dr Margo Aye.            Medication Adjustments/Labs and Tests Ordered: Current medicines are reviewed at length with the patient today.  Concerns regarding medicines are outlined above.  No orders of the defined types were placed in this encounter.  No orders of the defined types were placed in this encounter.   Patient Instructions  Medication Instructions:  Continue same medications   Lab Work: None ordered   Testing/Procedures: None ordered   Follow-Up: At [facility], you and your health needs are our priority.  As part of our continuing mission to provide you with exceptional heart care, we have created designated Provider Care Teams.  These Care Teams include your primary Cardiologist (physician) and Advanced Practice Providers (APPs -  Physician Assistants and Nurse Practitioners) who all work together to provide you with the care you need, when you need it.  We recommend  signing up for the patient portal called "MyChart".  Sign up information is provided on this After Visit Summary.  MyChart is used to connect with patients for Virtual Visits (Telemedicine).  Patients are able to view lab/test results, encounter notes, upcoming appointments, etc.  Non-urgent messages can be sent to your provider as well.   To learn more about what you can do with MyChart, go to [email or web address].    Your next appointment:  As Needed    Provider:  Dr.Brennyn Ortlieb     Signed, Davari Lopes [place], MD  12/21/2022 11:31 AM    [facility]

## 2022-12-22 ENCOUNTER — Encounter: Payer: Self-pay | Admitting: Family Medicine

## 2022-12-22 NOTE — Telephone Encounter (Signed)
Pt ready for scheduling on or after 12/17/22  Out-of-pocket cost due at time of visit: $40  Primary: [employer] Medicare Adv [place] SHP Prolia co-insurance: $40 Admin fee co-insurance: ($40)  Deductible: does not apply  Prior Auth: APPROVED [place] Case ID #: [id number] Valid: 12/17/22-08/24/23  Secondary: N/A Prolia co-insurance:  Admin fee co-insurance:  Deductible:  Prior Auth:  PA# Valid:     ** This summary of benefits is an estimation of the patient's out-of-pocket cost. Exact cost may very based on individual plan coverage.

## 2022-12-23 ENCOUNTER — Ambulatory Visit: Payer: Medicare PPO | Admitting: Family Medicine

## 2022-12-23 DIAGNOSIS — M810 Age-related osteoporosis without current pathological fracture: Secondary | ICD-10-CM

## 2022-12-23 MED ORDER — DENOSUMAB 60 MG/ML ~~LOC~~ SOSY
60.0000 mg | PREFILLED_SYRINGE | Freq: Once | SUBCUTANEOUS | Status: AC
  Administered 2022-12-23: 60 mg via SUBCUTANEOUS

## 2022-12-23 NOTE — Progress Notes (Signed)
Patient given [place] prolia injection 60mg/ml in her left arm. Patient tolerated injection well without reaction at the injection site. Patient will schedule next injection, which is 6 months from today.  

## 2023-01-08 DIAGNOSIS — H04123 Dry eye syndrome of bilateral lacrimal glands: Secondary | ICD-10-CM | POA: Diagnosis not present

## 2023-01-08 DIAGNOSIS — Z961 Presence of intraocular lens: Secondary | ICD-10-CM | POA: Diagnosis not present

## 2023-01-08 DIAGNOSIS — H10413 Chronic giant papillary conjunctivitis, bilateral: Secondary | ICD-10-CM | POA: Diagnosis not present

## 2023-01-08 DIAGNOSIS — H43391 Other vitreous opacities, right eye: Secondary | ICD-10-CM | POA: Diagnosis not present

## 2023-01-08 DIAGNOSIS — H338 Other retinal detachments: Secondary | ICD-10-CM | POA: Diagnosis not present

## 2023-01-08 DIAGNOSIS — H5052 Exophoria: Secondary | ICD-10-CM | POA: Diagnosis not present

## 2023-01-08 DIAGNOSIS — H353131 Nonexudative age-related macular degeneration, bilateral, early dry stage: Secondary | ICD-10-CM | POA: Diagnosis not present

## 2023-01-23 NOTE — Telephone Encounter (Signed)
Last Prolia inj 12/23/22 Next Prolia inj due 06/26/23 

## 2023-01-25 MED ORDER — PREDNISONE 10 MG PO TABS: ORAL_TABLET | ORAL | 0 refills | Status: DC

## 2023-01-25 MED ORDER — AZITHROMYCIN 250 MG PO TABS: ORAL_TABLET | ORAL | 0 refills | Status: AC

## 2023-01-25 NOTE — Telephone Encounter (Signed)
Spoke with the pt She is c/o increased SOB, wheezing, chest tightness x 3 days  She states she is not coughing and denies any fevers, aches  She is using her symbicort, singulair, albuterol, flonase  She is asking for pred and zpack   Allergies  Allergen Reactions   Cefuroxime Axetil Shortness Of Breath   Iodine Shortness Of Breath   Augmentin [Amoxicillin-Pot Clavulanate] Other (See Comments)    Pt c/o tingling in lips   Avelox [Moxifloxacin Hcl In Nacl] Other (See Comments)    Hallucinations    Macrodantin [Nitrofurantoin Macrocrystal] Nausea Only    Chills, aching   Bactrim Rash    Septra   Ceclor [Cefaclor] Rash

## 2023-01-25 NOTE — Telephone Encounter (Signed)
Yes lets reschedule your PFTs. May begin Z-Pak No. 1 take as directed, prednisone taper over the next week (rx sent to pharm)  Please set up office visit in the next few weeks with PFTs  Please contact office for sooner follow up if symptoms do not improve or worsen or seek emergency care

## 2023-01-28 ENCOUNTER — Ambulatory Visit: Payer: Medicare PPO | Admitting: Pulmonary Disease

## 2023-02-01 DIAGNOSIS — E559 Vitamin D deficiency, unspecified: Secondary | ICD-10-CM | POA: Diagnosis not present

## 2023-02-01 DIAGNOSIS — E782 Mixed hyperlipidemia: Secondary | ICD-10-CM | POA: Diagnosis not present

## 2023-02-05 DIAGNOSIS — K580 Irritable bowel syndrome with diarrhea: Secondary | ICD-10-CM | POA: Diagnosis not present

## 2023-02-05 DIAGNOSIS — J4520 Mild intermittent asthma, uncomplicated: Secondary | ICD-10-CM | POA: Diagnosis not present

## 2023-02-05 DIAGNOSIS — Z0001 Encounter for general adult medical examination with abnormal findings: Secondary | ICD-10-CM | POA: Diagnosis not present

## 2023-02-05 DIAGNOSIS — K219 Gastro-esophageal reflux disease without esophagitis: Secondary | ICD-10-CM | POA: Diagnosis not present

## 2023-02-05 DIAGNOSIS — R944 Abnormal results of kidney function studies: Secondary | ICD-10-CM | POA: Diagnosis not present

## 2023-02-05 DIAGNOSIS — M810 Age-related osteoporosis without current pathological fracture: Secondary | ICD-10-CM | POA: Diagnosis not present

## 2023-02-05 DIAGNOSIS — E8801 Alpha-1-antitrypsin deficiency: Secondary | ICD-10-CM | POA: Diagnosis not present

## 2023-02-05 DIAGNOSIS — E782 Mixed hyperlipidemia: Secondary | ICD-10-CM | POA: Diagnosis not present

## 2023-02-05 DIAGNOSIS — K589 Irritable bowel syndrome without diarrhea: Secondary | ICD-10-CM | POA: Diagnosis not present

## 2023-03-15 ENCOUNTER — Encounter: Payer: Self-pay | Admitting: Pulmonary Disease

## 2023-03-16 MED ORDER — ZITHROMAX Z-PAK 250 MG PO TABS: ORAL_TABLET | ORAL | 0 refills | Status: DC

## 2023-03-16 MED ORDER — PREDNISONE 10 MG PO TABS: 40.0000 mg | ORAL_TABLET | Freq: Every day | ORAL | 0 refills | Status: AC

## 2023-03-17 ENCOUNTER — Telehealth: Payer: Self-pay | Admitting: Pulmonary Disease

## 2023-03-17 NOTE — Telephone Encounter (Signed)
I spoke with the pharmacy [facility]) they do not have the brand name Z-Pak just the generic. The prescription was write for brand name only.  So they can not fill it.  Dr. Isaiah Serge, ok to give generic?

## 2023-03-17 NOTE — Telephone Encounter (Signed)
Wants to confirm medication

## 2023-03-19 NOTE — Telephone Encounter (Signed)
I notified the pharmacy and the patient.  Nothing further needed.

## 2023-03-19 NOTE — Telephone Encounter (Signed)
Nothing further needed.  Closing encounter.

## 2023-03-19 NOTE — Telephone Encounter (Signed)
Ok to send a prescription for generic z pack. Thanks

## 2023-04-08 ENCOUNTER — Other Ambulatory Visit: Payer: Self-pay | Admitting: Pulmonary Disease

## 2023-04-08 ENCOUNTER — Encounter: Payer: Self-pay | Admitting: Pulmonary Disease

## 2023-04-09 DIAGNOSIS — H04123 Dry eye syndrome of bilateral lacrimal glands: Secondary | ICD-10-CM | POA: Diagnosis not present

## 2023-04-09 DIAGNOSIS — H10413 Chronic giant papillary conjunctivitis, bilateral: Secondary | ICD-10-CM | POA: Diagnosis not present

## 2023-04-09 MED ORDER — ALBUTEROL SULFATE (2.5 MG/3ML) 0.083% IN NEBU: 2.5000 mg | INHALATION_SOLUTION | Freq: Four times a day (QID) | RESPIRATORY_TRACT | 12 refills | Status: DC | PRN

## 2023-04-09 MED ORDER — BUDESONIDE-FORMOTEROL FUMARATE 160-4.5 MCG/ACT IN AERO: 2.0000 | INHALATION_SPRAY | Freq: Two times a day (BID) | RESPIRATORY_TRACT | 3 refills | Status: DC

## 2023-04-09 MED ORDER — PREDNISONE 10 MG PO TABS: 40.0000 mg | ORAL_TABLET | Freq: Every day | ORAL | 0 refills | Status: AC

## 2023-04-09 MED ORDER — AZITHROMYCIN 500 MG PO TABS: 500.0000 mg | ORAL_TABLET | Freq: Every day | ORAL | 0 refills | Status: AC

## 2023-04-09 MED ORDER — ALBUTEROL SULFATE HFA 108 (90 BASE) MCG/ACT IN AERS: 2.0000 | INHALATION_SPRAY | Freq: Four times a day (QID) | RESPIRATORY_TRACT | 2 refills | Status: DC | PRN

## 2023-04-11 MED ORDER — ZITHROMAX Z-PAK 250 MG PO TABS: ORAL_TABLET | ORAL | 0 refills | Status: DC

## 2023-04-12 NOTE — Telephone Encounter (Signed)
Jessica from [employer] Zpak needs to be generic. Shanda Bumps phone number is [phone].

## 2023-04-13 MED ORDER — AZITHROMYCIN 250 MG PO TABS: 250.0000 mg | ORAL_TABLET | Freq: Every day | ORAL | 0 refills | Status: AC

## 2023-04-13 NOTE — Telephone Encounter (Signed)
Rx changed to  generic

## 2023-04-20 ENCOUNTER — Encounter: Payer: Self-pay | Admitting: Pulmonary Disease

## 2023-04-27 ENCOUNTER — Other Ambulatory Visit: Payer: Self-pay | Admitting: Obstetrics and Gynecology

## 2023-04-27 DIAGNOSIS — Z1231 Encounter for screening mammogram for malignant neoplasm of breast: Secondary | ICD-10-CM

## 2023-04-30 ENCOUNTER — Encounter: Payer: Self-pay | Admitting: *Deleted

## 2023-05-04 ENCOUNTER — Ambulatory Visit: Payer: Medicare PPO | Admitting: Pulmonary Disease

## 2023-05-04 ENCOUNTER — Encounter: Payer: Self-pay | Admitting: Pulmonary Disease

## 2023-05-04 VITALS — BP 120/78 | HR 83 | Temp 97.3°F | Ht 66.0 in | Wt 169.9 lb

## 2023-05-04 DIAGNOSIS — E8801 Alpha-1-antitrypsin deficiency: Secondary | ICD-10-CM

## 2023-05-04 DIAGNOSIS — J4541 Moderate persistent asthma with (acute) exacerbation: Secondary | ICD-10-CM | POA: Diagnosis not present

## 2023-05-04 LAB — PULMONARY FUNCTION TEST
DL/VA % pred: 97 %
DL/VA: 3.96 ml/min/mmHg/L
DLCO cor % pred: 66 %
DLCO cor: 13.89 ml/min/mmHg
DLCO unc % pred: 68 %
DLCO unc: 14.38 ml/min/mmHg
FEF 25-75 Post: 1.93 L/s
FEF 25-75 Pre: 1.76 L/s
FEF2575-%Change-Post: 10 %
FEF2575-%Pred-Post: 93 %
FEF2575-%Pred-Pre: 85 %
FEV1-%Change-Post: 1 %
FEV1-%Pred-Post: 83 %
FEV1-%Pred-Pre: 82 %
FEV1-Post: 2.08 L
FEV1-Pre: 2.05 L
FEV1FVC-%Change-Post: 3 %
FEV1FVC-%Pred-Pre: 100 %
FEV6-%Change-Post: -1 %
FEV6-%Pred-Post: 83 %
FEV6-%Pred-Pre: 85 %
FEV6-Post: 2.63 L
FEV6-Pre: 2.67 L
FEV6FVC-%Pred-Post: 104 %
FEV6FVC-%Pred-Pre: 104 %
FVC-%Change-Post: -1 %
FVC-%Pred-Post: 80 %
FVC-%Pred-Pre: 81 %
FVC-Post: 2.63 L
FVC-Pre: 2.67 L
Post FEV1/FVC ratio: 79 %
Post FEV6/FVC ratio: 100 %
Pre FEV1/FVC ratio: 77 %
Pre FEV6/FVC Ratio: 100 %
RV % pred: 91 %
RV: 2.08 L
TLC % pred: 82 %
TLC: 4.4 L

## 2023-05-04 NOTE — Progress Notes (Signed)
Kathleen Hammond    [id number]    October 03, 1955  Primary Care Physician:Hall, Kathleene Hazel, MD  Referring Physician: Benita Stabile, MD [address] [name],  [place] [address]   Chief complaint: Follow-up for alpha-1 antitrypsin deficiency  HPI: 67 y.o. 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  Diagnosed over 10 years ago.    Has been asymptomatic with no inhaler therapy.  She was also evaluated at [facility] and advised that she did not need replacement therapy.  Has history of asthma which appears to be mild.  Previously on Symbicort but currently on albuterol with good control of symptoms  In November 2021 she required prednisone for 5 days after she developed a flareup of asthma after Moderna booster  Pets: No pets Occupation: Retired school principal Exposures: No known exposures.  No mold, hot tub, Jacuzzi Smoking history: Smoker Travel history: Significant travel history Relevant family history: No significant family history of lung disease.  Interim history: She had been off Symbicort with good control of symptoms.  However over August and September 2023 she required 2 rounds of prednisone and Z-Pak for sinusitis which called mild asthma exacerbation. Overall she feels better and back to baseline.  Outpatient Encounter Medications as of 05/04/2023  Medication Sig   albuterol (PROVENTIL) (2.5 MG/3ML) 0.083% nebulizer solution Take 3 mLs (2.5 mg total) by nebulization every 6 (six) hours as needed for wheezing or shortness of breath.   albuterol (VENTOLIN HFA) 108 (90 Base) MCG/ACT inhaler INHALE 2 PUFFS INTO THE LUNGS EVERY 6 HOURS AS NEEDED FOR ASTHMA   budesonide-formoterol (SYMBICORT) 160-4.5 MCG/ACT inhaler Inhale 2 puffs into the lungs 2 (two) times daily.   ergocalciferol (VITAMIN D2) 1.25 MG (50000 UT) capsule Take 50,000 Units by mouth once a week.    PANTOPRAZOLE SODIUM PO Take 40 mg by mouth daily.   rosuvastatin (CRESTOR) 5 MG tablet 10 mg.    SINGULAIR 10 MG tablet Take 1 tablet by mouth at bedtime.    albuterol (VENTOLIN HFA) 108 (90 Base) MCG/ACT inhaler Inhale 2 puffs into the lungs every 6 (six) hours as needed for wheezing or shortness of breath. (Patient not taking: Reported on 05/04/2023)   budesonide-formoterol (SYMBICORT) 160-4.5 MCG/ACT inhaler Inhale 2 puffs into the lungs 2 (two) times daily. (Patient not taking: Reported on 05/04/2023)   No facility-administered encounter medications on file as of 05/04/2023.    Physical Exam: Blood pressure 112/66, pulse 84, temperature 98 F (36.7 C), temperature source Oral, height 5\' 5"  (1.651 m), weight 166 lb 12.8 oz (75.7 kg), SpO2 98 %. Gen:      No acute distress HEENT:  EOMI, sclera anicteric Neck:     No masses; no thyromegaly Lungs:    Clear to auscultation bilaterally; normal respiratory effort CV:         Regular rate and rhythm; no murmurs Abd:      + bowel sounds; soft, non-tender; no palpable masses, no distension Ext:    No edema; adequate peripheral perfusion Skin:      Warm and dry; no rash Neuro: alert and oriented x 3 Psych: normal mood and affect   Data Reviewed: Imaging: Chest x-ray 04/06/2012-no acute cardiopulmonary abnormality CT abdomen pelvis 05/22/13-visualized lung bases are normal. Chest x-ray 06/14/2019-no acute cardiopulmonary abnormality I have reviewed the images personally.  PFTs: 08/03/2018 FVC 3.29 [95%], FEV1 2.45 [92%], F/F 75, TLC 5.16 [96%], DLCO 14.58 [54%) No obstruction or restriction.  Moderate  diffusion impairment  10/15/2020 FVC 3.11 [92%], FEV1 2.40 [93%], F/F 77, TLC 4.75 [88%], DLCO 17.71 [4%] Normal test  05/04/2023 FVC 2.63 [80%], FEV1 2.08 [83%], F/F79, TLC 4.40 [82%], DLCO 14.38 [68%] Mild diffusion defect  ACT score 05/04/2023- 22  Labs: Alpha-1 antitrypsin 06/14/2019-72, PI MZ Hepatic panel 06/14/2019-within normal limits  CBC 06/13/2020- WBC 4.6, eos 8.8%, absolute eosinophil count 405 CBC 10/15/2020-WBC 11.6,  eos 0.6%, absolute eosinophil count 70  Assessment:  Mild asthma Stable on Symbicort, Singulair  Alpha-1 antitrypsin carrier status Follow-up PFT reviewed with no obstruction Repeat alpha-1 antitrypsin levels and LFTs today  Plan/Recommendations: - Continue Symbicort for now - Alpha-1 antitrypsin levels, LFTs  Chilton Greathouse MD [facility] 06/25/2020, 8:47 AM

## 2023-05-04 NOTE — Progress Notes (Signed)
Full PFT performed today. °

## 2023-05-04 NOTE — Patient Instructions (Signed)
Full PFT performed today. °

## 2023-05-04 NOTE — Patient Instructions (Signed)
I am glad you are doing well with your breathing Continue the Symbicort Recheck alpha-1 antitrypsin levels today and a hepatic panel  Follow-up in 1 year

## 2023-05-05 ENCOUNTER — Other Ambulatory Visit (INDEPENDENT_AMBULATORY_CARE_PROVIDER_SITE_OTHER): Payer: Medicare PPO

## 2023-05-05 DIAGNOSIS — E8801 Alpha-1-antitrypsin deficiency: Secondary | ICD-10-CM

## 2023-05-05 LAB — COMPREHENSIVE METABOLIC PANEL
ALT: 19 U/L (ref 0–35)
AST: 24 U/L (ref 0–37)
Albumin: 4.3 g/dL (ref 3.5–5.2)
Alkaline Phosphatase: 58 U/L (ref 39–117)
BUN: 13 mg/dL (ref 6–23)
CO2: 28 meq/L (ref 19–32)
Calcium: 9.5 mg/dL (ref 8.4–10.5)
Chloride: 104 meq/L (ref 96–112)
Creatinine, Ser: 0.85 mg/dL (ref 0.40–1.20)
GFR: 69.79 mL/min (ref >60.00)
Glucose, Bld: 84 mg/dL (ref 70–99)
Potassium: 3.9 meq/L (ref 3.5–5.1)
Sodium: 141 meq/L (ref 135–145)
Total Bilirubin: 0.6 mg/dL (ref 0.2–1.2)
Total Protein: 6.9 g/dL (ref 6.0–8.3)

## 2023-05-08 ENCOUNTER — Encounter: Payer: Self-pay | Admitting: Pulmonary Disease

## 2023-05-10 LAB — ALPHA-1-ANTITRYPSIN: A-1 Antitrypsin, Ser: 84 mg/dL (ref 83–199)

## 2023-05-12 ENCOUNTER — Encounter: Payer: Self-pay | Admitting: Family Medicine

## 2023-05-21 ENCOUNTER — Encounter: Payer: Self-pay | Admitting: Family Medicine

## 2023-05-24 ENCOUNTER — Other Ambulatory Visit: Payer: Self-pay | Admitting: *Deleted

## 2023-05-24 DIAGNOSIS — M810 Age-related osteoporosis without current pathological fracture: Secondary | ICD-10-CM

## 2023-05-24 MED ORDER — ERGOCALCIFEROL 1.25 MG (50000 UT) PO CAPS: 50000.0000 [IU] | ORAL_CAPSULE | ORAL | 1 refills | Status: DC

## 2023-05-29 NOTE — Telephone Encounter (Signed)
Prolia VOB initiated via [email or web address]  Next Prolia inj DUE: 06/26/23

## 2023-06-01 ENCOUNTER — Ambulatory Visit
Admission: RE | Admit: 2023-06-01 | Discharge: 2023-06-01 | Disposition: A | Discharge status: Home / Self Care | Payer: Medicare PPO | Source: Ambulatory Visit

## 2023-06-01 DIAGNOSIS — Z1231 Encounter for screening mammogram for malignant neoplasm of breast: Secondary | ICD-10-CM

## 2023-06-03 NOTE — Telephone Encounter (Signed)
Prior Auth REQUIRED for PROLIA  Prior Auth on file and valid Prior Auth: APPROVED PA Case ID #: [id number] Valid: 12/17/22-08/24/23

## 2023-06-03 NOTE — Telephone Encounter (Signed)
Pt ready for scheduling on or after 06/26/23  Out-of-pocket cost due at time of visit: $40  Primary: [employer] Medicare Adv SHP  Prolia co-insurance: $40 Admin fee co-insurance: 0%  Deductible: does not apply  Prior Auth: APPROVED PA Case ID #: [id number] Valid: 12/17/22-08/24/23  Secondary: N/A Prolia co-insurance:  Admin fee co-insurance:  Deductible:  Prior Auth:  PA# Valid:     ** This summary of benefits is an estimation of the patient's out-of-pocket cost. Exact cost may very based on individual plan coverage.

## 2023-06-11 ENCOUNTER — Other Ambulatory Visit: Payer: Self-pay | Admitting: Family Medicine

## 2023-06-11 DIAGNOSIS — M810 Age-related osteoporosis without current pathological fracture: Secondary | ICD-10-CM | POA: Diagnosis not present

## 2023-06-12 LAB — BASIC METABOLIC PANEL
BUN/Creatinine Ratio: 24 (ref 12–28)
BUN: 20 mg/dL (ref 8–27)
CO2: 25 mmol/L (ref 20–29)
Calcium: 9.3 mg/dL (ref 8.7–10.3)
Chloride: 101 mmol/L (ref 96–106)
Creatinine, Ser: 0.83 mg/dL (ref 0.57–1.00)
Glucose: 87 mg/dL (ref 70–99)
Potassium: 3.8 mmol/L (ref 3.5–5.2)
Sodium: 142 mmol/L (ref 134–144)
eGFR: 76 mL/min/{1.73_m2} (ref >59)

## 2023-06-12 LAB — VITAMIN D 25 HYDROXY (VIT D DEFICIENCY, FRACTURES): Vit D, 25-Hydroxy: 43.3 ng/mL (ref 30.0–100.0)

## 2023-06-25 ENCOUNTER — Ambulatory Visit: Payer: Medicare PPO | Admitting: Family Medicine

## 2023-06-25 DIAGNOSIS — M810 Age-related osteoporosis without current pathological fracture: Secondary | ICD-10-CM

## 2023-06-25 MED ORDER — DENOSUMAB 60 MG/ML ~~LOC~~ SOSY
60.0000 mg | PREFILLED_SYRINGE | Freq: Once | SUBCUTANEOUS | Status: AC
  Administered 2023-06-25: 60 mg via SUBCUTANEOUS

## 2023-06-25 NOTE — Progress Notes (Signed)
Patient given [place] prolia injection 60mg/ml in her left arm. Patient tolerated injection well without reaction at the injection site. Patient will schedule next injection, which is 6 months from today.  

## 2023-06-29 NOTE — Telephone Encounter (Signed)
Prolia PA is approved 08/25/23 to 08/23/24. [employer] PA ref #: [id number].

## 2023-07-06 NOTE — Telephone Encounter (Signed)
Last Prolia inj 06/25/23 Next Prolia inj due 12/24/23

## 2023-07-21 ENCOUNTER — Other Ambulatory Visit: Payer: Self-pay | Admitting: Pulmonary Disease

## 2023-07-21 ENCOUNTER — Telehealth: Payer: Self-pay | Admitting: Pulmonary Disease

## 2023-07-21 DIAGNOSIS — J4540 Moderate persistent asthma, uncomplicated: Secondary | ICD-10-CM

## 2023-07-21 MED ORDER — PREDNISONE 20 MG PO TABS: 40.0000 mg | ORAL_TABLET | Freq: Every day | ORAL | 0 refills | Status: AC

## 2023-07-21 NOTE — Telephone Encounter (Signed)
Spoke with patient and she reports waking up last night with a sore throat. This morning she has sinus congestion, sore throat, some ear pain. Denies fever/chills/cough/shob; does feel some possible chest tightness. She has not taken any OTC medications for her symptoms. She states her husband has been sick with the same thing for several days.   Please advise, thank you!

## 2023-07-21 NOTE — Telephone Encounter (Signed)
PT calling for Pred and a Zpack. States she is a Alpha 1 PT ans she is starting to feel tight in her chest and more. Her # is [phone]  Please call her to advise if needed . [facility] on [address] in [place].

## 2023-07-21 NOTE — Telephone Encounter (Signed)
LVM for patient to return call if needed  [facility] message sent with recommendations.

## 2023-07-21 NOTE — Telephone Encounter (Signed)
I can send it to have on hand going into the extended weekend but I do not recommend her starting it right now. Viral symptoms can linger for 7-10 days. Thanks.

## 2023-07-21 NOTE — Telephone Encounter (Signed)
Sounds like a viral illness. Needs to COVID test. Call if positive. No need for abx at this time. She should go to UC if symptoms worsen. She can use OTC Mucinex DM, flonase nasal spray, saline rinses and throat lozenges/chloraseptic spray. Would use albuterol as needed for the chest tightness for now. Thanks.

## 2023-07-27 ENCOUNTER — Ambulatory Visit: Payer: Medicare PPO | Admitting: Primary Care

## 2023-07-27 ENCOUNTER — Ambulatory Visit: Payer: Medicare PPO

## 2023-07-27 ENCOUNTER — Encounter: Payer: Self-pay | Admitting: Primary Care

## 2023-07-27 VITALS — BP 123/77 | HR 81 | Temp 96.9°F | Ht 66.0 in | Wt 175.2 lb

## 2023-07-27 DIAGNOSIS — J4541 Moderate persistent asthma with (acute) exacerbation: Secondary | ICD-10-CM

## 2023-07-27 DIAGNOSIS — J45909 Unspecified asthma, uncomplicated: Secondary | ICD-10-CM | POA: Diagnosis not present

## 2023-07-27 MED ORDER — PROMETHAZINE-DM 6.25-15 MG/5ML PO SYRP: 5.0000 mL | ORAL_SOLUTION | Freq: Four times a day (QID) | ORAL | 0 refills | Status: DC | PRN

## 2023-07-27 MED ORDER — IPRATROPIUM BROMIDE 0.03 % NA SOLN: 2.0000 | Freq: Two times a day (BID) | NASAL | 0 refills | Status: DC

## 2023-07-27 MED ORDER — PREDNISONE 20 MG PO TABS: ORAL_TABLET | ORAL | 0 refills | Status: DC

## 2023-07-27 MED ORDER — ALBUTEROL SULFATE (2.5 MG/3ML) 0.083% IN NEBU: 2.5000 mg | INHALATION_SOLUTION | Freq: Four times a day (QID) | RESPIRATORY_TRACT | 2 refills | Status: DC | PRN

## 2023-07-27 NOTE — Progress Notes (Signed)
@Patient  ID: Kathleen Hammond, female    DOB: 1956-01-13, 67 y.o.   MRN: [id number]  Chief Complaint  Patient presents with   Follow-up    Pt complains of a sore throat, head congestion, and sneezing. Pt states this has all turned into chest congestion with wheezing. Pt complains of s.o.b. Pt states she is still having the coughing and wheezing and wants this checked.     Referring provider: Benita Stabile, MD  HPI: 67 year old, never smoked. PMH significant for asthma, allergic rhinitis, osteoporosis, alpha-1 antitrypsin deficiency carrier, mixed hyperlipidemia.   07/27/2023 Discussed the use of AI scribe software for clinical note transcription with the patient, who gave verbal consent to proceed.  History of Present Illness   The patient, with a history of alpha 1 antitrypsin deficiency and asthma, presents with a week-long history of upper respiratory symptoms. She reports waking up with a sore throat a week ago, which was followed by heavy head congestion and episodes of intense sneezing. She initially suspected COVID-19, but a test was negative. Over the course of the week, the symptoms progressed to include chest tightness, wheezing, and shortness of breath, but minimal coughing. The patient describes the cough as non-productive, with only occasional loosening of mucus.  In response to the worsening symptoms, the patient initiated a course of prednisone and a Z-Pak, both of which she had on hand. She began using Symbicort, which she takes as needed, two puffs twice a day. Despite these interventions, the patient reports persistent chest tightness, tenderness in the upper chest and under the ribs from coughing, and a wheezy cough. She has also been taking Mucinex, flonase and Singulair.  The patient denies any recent history of pneumonia. She has never smoked but was exposed to second-hand smoke growing up.      Allergies  Allergen Reactions   Cefuroxime Axetil Shortness Of Breath    Iodine Shortness Of Breath   Augmentin [Amoxicillin-Pot Clavulanate] Other (See Comments)    Pt c/o tingling in lips   Avelox [Moxifloxacin Hcl In Nacl] Other (See Comments)    Hallucinations    Macrodantin [Nitrofurantoin Macrocrystal] Nausea Only    Chills, aching   Bactrim Rash    Septra   Ceclor [Cefaclor] Rash    Immunization History  Administered Date(s) Administered   Influenza Split 08/22/2015   Influenza-Unspecified 07/08/2016, 06/17/2017, 07/13/2018, 07/04/2019   Moderna Sars-Covid-2 Vaccination 08/30/2019, 09/27/2019, 06/20/2020   Td 08/28/2011, 11/19/2015    Past Medical History:  Diagnosis Date   Alpha-1-antitrypsin deficiency (HCC)    Asthma    History of anemia    Hyperlipidemia    Irritable bowel    Osteoporosis    Perimenopausal vasomotor symptoms    Postmenopausal    Urinary incontinence     Tobacco History: Social History   Tobacco Use  Smoking Status Never  Smokeless Tobacco Never   Counseling given: Not Answered   Outpatient Medications Prior to Visit  Medication Sig Dispense Refill   albuterol (PROVENTIL) (2.5 MG/3ML) 0.083% nebulizer solution Take 3 mLs (2.5 mg total) by nebulization every 6 (six) hours as needed for wheezing or shortness of breath. 75 mL 12   albuterol (VENTOLIN HFA) 108 (90 Base) MCG/ACT inhaler INHALE 2 PUFFS INTO THE LUNGS EVERY 6 HOURS AS NEEDED FOR ASTHMA 18 g 5   albuterol (VENTOLIN HFA) 108 (90 Base) MCG/ACT inhaler Inhale 2 puffs into the lungs every 6 (six) hours as needed for wheezing or shortness of breath. 8 g  2   budesonide-formoterol (SYMBICORT) 160-4.5 MCG/ACT inhaler Inhale 2 puffs into the lungs 2 (two) times daily. 10.2 g 0   ergocalciferol (VITAMIN D2) 1.25 MG (50000 UT) capsule Take 1 capsule (50,000 Units total) by mouth once a week. 8 capsule 1   PANTOPRAZOLE SODIUM PO Take 40 mg by mouth daily.     rosuvastatin (CRESTOR) 5 MG tablet 10 mg.     SINGULAIR 10 MG tablet Take 1 tablet by mouth at bedtime.       budesonide-formoterol (SYMBICORT) 160-4.5 MCG/ACT inhaler Inhale 2 puffs into the lungs 2 (two) times daily. (Patient not taking: Reported on 05/04/2023) 1 each 3   No facility-administered medications prior to visit.   Review of Systems  Review of Systems  HENT:  Positive for postnasal drip.   Respiratory:  Positive for cough, chest tightness and wheezing.   Cardiovascular: Negative.    Physical Exam  BP 123/77 (BP Location: Left Arm, Patient Position: Sitting, Cuff Size: Normal)   Pulse 81   Temp (!) 96.9 F (36.1 C) (Temporal)   Ht 5\' 6"  (1.676 m)   Wt 175 lb 3.2 oz (79.5 kg)   SpO2 97%   BMI 28.28 kg/m  Physical Exam Constitutional:      Appearance: Normal appearance.  HENT:     Head: Normocephalic and atraumatic.     Ears:     Comments: Mild effusion left ear, no erythema     Mouth/Throat:     Mouth: Mucous membranes are moist.     Pharynx: Oropharynx is clear.  Cardiovascular:     Rate and Rhythm: Normal rate and regular rhythm.  Pulmonary:     Effort: Pulmonary effort is normal.     Breath sounds: Normal breath sounds. No wheezing.     Comments: Possible rale left base Skin:    General: Skin is warm and dry.  Neurological:     General: No focal deficit present.     Mental Status: She is alert and oriented to person, place, and time. Mental status is at baseline.  Psychiatric:        Mood and Affect: Mood normal.        Behavior: Behavior normal.        Thought Content: Thought content normal.        Judgment: Judgment normal.      Lab Results:  CBC    Component Value Date/Time   WBC 11.6 (H) 10/15/2020 1119   RBC 4.79 10/15/2020 1119   HGB 13.9 10/15/2020 1119   HCT 41.5 10/15/2020 1119   PLT 270.0 10/15/2020 1119   MCV 86.7 10/15/2020 1119   MCH 30.5 11/05/2012 1352   MCHC 33.5 10/15/2020 1119   RDW 13.5 10/15/2020 1119   LYMPHSABS 2.2 10/15/2020 1119   MONOABS 0.8 10/15/2020 1119   EOSABS 0.1 10/15/2020 1119   BASOSABS 0.1 10/15/2020  1119    BMET    Component Value Date/Time   NA 142 06/11/2023 1150   K 3.8 06/11/2023 1150   CL 101 06/11/2023 1150   CO2 25 06/11/2023 1150   GLUCOSE 87 06/11/2023 1150   GLUCOSE 84 05/05/2023 1114   BUN 20 06/11/2023 1150   CREATININE 0.83 06/11/2023 1150   CALCIUM 9.3 06/11/2023 1150   GFRNONAA >90 11/05/2012 1352   GFRAA >90 11/05/2012 1352    BNP No results found for: "BNP"  ProBNP No results found for: "PROBNP"  Imaging: No results found.   Assessment & Plan:   1.  Moderate persistent asthma with acute exacerbation - DG Chest 2 View; Future     Upper Respiratory Infection with acute asthma exacerbation  Symptoms started 7 days ago with sore throat which progressed to head congestion, sneezing, and chest tightness. No improvement with Z-Pak and prednisone burst. Physical exam revealed possible fluid in left lower base of lungs -Order chest x-ray to rule out pneumonia or other complications -Continue Symbicort 2 puffs twice daily -Start Atrovent nasal spray twice daily for postnasal drip -Prescribe promethazine DM cough syrup, to be taken as needed for cough. -Extend prednisone 40mg  for additional 5 days -Use nebulizer twice daily for 3 days  Alpha 1 Antitrypsin Deficiency -No changes to current management      Glenford Bayley, NP 07/27/2023

## 2023-07-27 NOTE — Patient Instructions (Addendum)
-  UPPER RESPIRATORY INFECTION: An upper respiratory infection is a condition that affects the nose, throat, and airways. It started with a sore throat and progressed to head congestion, sneezing, and chest tightness. We will order a chest x-ray to rule out pneumonia or other complications. Continue using Symbicort as directed, start Atrovent nasal spray twice daily until symptoms improve, and take promethazine DM cough syrup as needed until symptoms improve. You will also start another round of prednisone 40mg  for 5 days and use a nebulizer twice daily for 3 days.  -ASTHMA: Asthma is a condition where your airways narrow and swell, making it difficult to breathe. Continue your current management with Symbicort 160mg  two puffs twice daily as needed and Singulair as needed.  -ALPHA 1 ANTITRYPSIN DEFICIENCY: Alpha 1 Antitrypsin Deficiency is a genetic condition that can affect your lungs and liver. There are no changes to your current management for this condition.  INSTRUCTIONS:  Please follow up with a chest x-ray to rule out pneumonia or other complications. Continue using Symbicort 2 puffs twice daily, start Atrovent nasal spray twice daily, and take promethazine DM cough syrup as needed. Start another round of prednisone 40mg  for 5 days and use a nebulizer twice daily for 3 days. Call if not better

## 2023-09-02 DIAGNOSIS — Z9889 Other specified postprocedural states: Secondary | ICD-10-CM | POA: Diagnosis not present

## 2023-09-02 DIAGNOSIS — H353132 Nonexudative age-related macular degeneration, bilateral, intermediate dry stage: Secondary | ICD-10-CM | POA: Diagnosis not present

## 2023-09-02 DIAGNOSIS — H43811 Vitreous degeneration, right eye: Secondary | ICD-10-CM | POA: Diagnosis not present

## 2023-09-11 ENCOUNTER — Encounter: Payer: Self-pay | Admitting: Pulmonary Disease

## 2023-09-12 ENCOUNTER — Encounter: Payer: Self-pay | Admitting: Pulmonary Disease

## 2023-09-16 DIAGNOSIS — G514 Facial myokymia: Secondary | ICD-10-CM | POA: Diagnosis not present

## 2023-09-16 DIAGNOSIS — E782 Mixed hyperlipidemia: Secondary | ICD-10-CM | POA: Diagnosis not present

## 2023-09-16 DIAGNOSIS — E559 Vitamin D deficiency, unspecified: Secondary | ICD-10-CM | POA: Diagnosis not present

## 2023-09-17 ENCOUNTER — Other Ambulatory Visit: Payer: Self-pay | Admitting: Family Medicine

## 2023-09-18 ENCOUNTER — Other Ambulatory Visit: Payer: Self-pay | Admitting: Primary Care

## 2023-09-20 ENCOUNTER — Other Ambulatory Visit: Payer: Self-pay | Admitting: Primary Care

## 2023-09-22 DIAGNOSIS — K219 Gastro-esophageal reflux disease without esophagitis: Secondary | ICD-10-CM | POA: Diagnosis not present

## 2023-09-22 DIAGNOSIS — Z23 Encounter for immunization: Secondary | ICD-10-CM | POA: Diagnosis not present

## 2023-09-22 DIAGNOSIS — R944 Abnormal results of kidney function studies: Secondary | ICD-10-CM | POA: Diagnosis not present

## 2023-09-22 DIAGNOSIS — E559 Vitamin D deficiency, unspecified: Secondary | ICD-10-CM | POA: Diagnosis not present

## 2023-09-22 DIAGNOSIS — E782 Mixed hyperlipidemia: Secondary | ICD-10-CM | POA: Diagnosis not present

## 2023-09-22 DIAGNOSIS — M810 Age-related osteoporosis without current pathological fracture: Secondary | ICD-10-CM | POA: Diagnosis not present

## 2023-09-22 DIAGNOSIS — J4520 Mild intermittent asthma, uncomplicated: Secondary | ICD-10-CM | POA: Diagnosis not present

## 2023-09-22 DIAGNOSIS — K580 Irritable bowel syndrome with diarrhea: Secondary | ICD-10-CM | POA: Diagnosis not present

## 2023-09-22 DIAGNOSIS — E8801 Alpha-1-antitrypsin deficiency: Secondary | ICD-10-CM | POA: Diagnosis not present

## 2023-10-07 ENCOUNTER — Encounter: Payer: Self-pay | Admitting: Pulmonary Disease

## 2023-10-07 NOTE — Telephone Encounter (Signed)
My asthma started acting up yesterday morning early. It continues today. I don't think I need prednisone yet, but I think I will by the weekend. Could you please send me a prescription for 40mg  prednisone for 5 days(That dosing seems to work best for me.) and for a ZPak. I have my Symbicort, Flonase, Albuterol inhaler and nebulizer Albuteral.    Spoke with the pt  She c/o SOB- woke her up early yesterday  She has some dry cough PND Minimal wheezing first thing in the am  No fevers  Please advise, thanks!  Allergies  Allergen Reactions   Cefuroxime Axetil Shortness Of Breath   Iodine Shortness Of Breath   Augmentin [Amoxicillin-Pot Clavulanate] Other (See Comments)    Pt c/o tingling in lips   Avelox [Moxifloxacin Hcl In Nacl] Other (See Comments)    Hallucinations    Macrodantin [Nitrofurantoin Macrocrystal] Nausea Only    Chills, aching   Bactrim Rash    Septra   Ceclor [Cefaclor] Rash

## 2023-10-08 ENCOUNTER — Telehealth: Payer: Self-pay | Admitting: Pulmonary Disease

## 2023-10-08 MED ORDER — ZITHROMAX Z-PAK 250 MG PO TABS: ORAL_TABLET | ORAL | 0 refills | Status: DC

## 2023-10-08 MED ORDER — PREDNISONE 20 MG PO TABS: ORAL_TABLET | ORAL | 0 refills | Status: DC

## 2023-10-08 NOTE — Telephone Encounter (Signed)
Per Patient message from 2/13. The pharmacy only has the generic brand of Zpak.   Ok to send in the generic?  Dr. Isaiah Serge is out of the office this afternoon (from what I can see). Tammy please advise.

## 2023-10-08 NOTE — Telephone Encounter (Signed)
Dr. Isaiah Serge, If you send a script and put dispense as written, the pharmacy cannot change it to the generic if they do not have the brand name, they have to call to request.  I called the pharmacy and had it changed to generic.  Thanks

## 2023-10-08 NOTE — Telephone Encounter (Signed)
Called and spoke with pharmacy, gave verbal for generic.  Nothing further needed.

## 2023-10-08 NOTE — Telephone Encounter (Signed)
Patient states Zpak on comes in generic. Pharmacy is [facility] [place]. Patient phone number is [phone].

## 2023-11-16 ENCOUNTER — Ambulatory Visit: Payer: Self-pay | Admitting: Pulmonary Disease

## 2023-11-16 NOTE — Telephone Encounter (Signed)
 Chief Complaint: SOB Symptoms: sinus pressure/congestion, mild wheezing, dry cough Frequency: yesterday Pertinent Negatives: Patient denies fever, CP Disposition: [] ED /[] Urgent Care (no appt availability in office) / [x] Appointment(In office/virtual)/ []  [facility]/ [] Home Care/ [] Refused Recommended Disposition /[] [facility]/ [x]  Follow-up with PCP Additional Notes: Pt c/o SOB, sinus pressure/pain, dry cough and mild wheezing x 2 days. Reports that she has concerns for sx evolving to sinus infection and requesting abx steroids. Reports starting respiratory INH yesterday as prescribed. Triager reinforced respiratory rx usage. Denies any fever, CP, sinus drainage. Attempted to schedule per protocol with pulm, but no access so advised to reach out to PCP for evaluation/tx. Triager will forward encounter for Clent Ridges, NP to review and advise. Patient verbalized understanding and is expecting call back from office for next steps. Triager also advised that if pt does not hear back from office, to follow disposition for further evaluation/treatment.    Reason for Disposition  [place]  Answer Assessment - Initial Assessment Questions E2C2 Pulmonary Triage - Initial Assessment Questions "Chief Complaint (e.g., cough, sob, wheezing, fever, chills, sweat or additional symptoms) *Go to specific symptom protocol after initial questions. SOB, cough Teeth hurt, ear pain - concern for sinus infection - requesting abx/steroids  "How long have symptoms been present?" SOB this AM Cough yesterday  Have you tested for COVID or Flu? Note: If not, ask patient if a home test can be taken. If so, instruct patient to call back for positive results. No, but will take COVID test and call back if positive  MEDICINES:   "Have you used any OTC meds to help with symptoms?" Yes If yes, ask "What medications?" Mucinex - plans to start this AM  "Have you used your inhalers/maintenance  medication?" Yes If yes, "What medications?" Symbicort - 2 puffs, reports started using yesterday (typically uses PRN) Albuterol - 2 puffs, last used this AM  If inhaler, ask "How many puffs and how often?" Note: Review instructions on medication in the chart. See above  OXYGEN: "Do you wear supplemental oxygen?" No If yes, "How many liters are you supposed to use?" N/a  "Do you monitor your oxygen levels?" Yes If yes, "What is your reading (oxygen level) today?" 98  "What is your usual oxygen saturation reading?"  (Note: Pulmonary O2 sats should be 90% or greater) Upper 90s   1. RESPIRATORY STATUS: "Describe your breathing?" (e.g., wheezing, shortness of breath, unable to speak, severe coughing)      Cough, SOB 2. ONSET: "When did this breathing problem begin?"      yesterday 3. PATTERN "Does the difficult breathing come and go, or has it been constant since it started?"      Cough comes and goes SOB just started this AM - INH has not provided much relief 4. SEVERITY: "How bad is your breathing?" (e.g., mild, moderate, severe)    - MILD: No SOB at rest, mild SOB with walking, speaks normally in sentences, can lie down, no retractions, pulse < 100.    - MODERATE: SOB at rest, SOB with minimal exertion and prefers to sit, cannot lie down flat, speaks in phrases, mild retractions, audible wheezing, pulse 100-120.    - SEVERE: Very SOB at rest, speaks in single words, struggling to breathe, sitting hunched forward, retractions, pulse > 120      Mild-moderate  5. RECURRENT SYMPTOM: "Have you had difficulty breathing before?" If Yes, ask: "When was the last time?" and "What happened that time?"  Abx/steroids 6. CARDIAC HISTORY: "Do you have any history of heart disease?" (e.g., heart attack, angina, bypass surgery, angioplasty)      no 7. LUNG HISTORY: "Do you have any history of lung disease?"  (e.g., pulmonary embolus, asthma, emphysema)     Asthma, alpha 1 8. CAUSE: "What do  you think is causing the breathing problem?"      exacerbation 9. OTHER SYMPTOMS: "Do you have any other symptoms? (e.g., dizziness, runny nose, cough, chest pain, fever)     No, not really  Answer Assessment - Initial Assessment Questions 1. LOCATION: "Where does it hurt?"      Face is tender  3. SEVERITY: "How bad is the pain?"   (Scale 1-10; mild, moderate or severe)   - MILD (1-3): doesn't interfere with normal activities    - MODERATE (4-7): interferes with normal activities (e.g., work or school) or awakens from sleep   - SEVERE (8-10): excruciating pain and patient unable to do any normal activities        5/10 5. NASAL CONGESTION: "Is the nose blocked?" If Yes, ask: "Can you open it or must you breathe through your mouth?"     No drainage, but congestion 6. NASAL DISCHARGE: "Do you have discharge from your nose?" If so ask, "What color?"     No drainage  Protocols used: Breathing Difficulty-A-AH, Sinus Pain or Congestion-A-AH

## 2023-11-17 MED ORDER — PREDNISONE 20 MG PO TABS: ORAL_TABLET | ORAL | 0 refills | Status: DC

## 2023-11-17 NOTE — Addendum Note (Signed)
 Addended byChilton Greathouse on: 11/17/2023 08:50 AM   Modules accepted: Orders

## 2023-11-17 NOTE — Telephone Encounter (Signed)
 Called and discussed with patient.  She is starting to feel better.  Has cough but no mucus production She has requested a short course of prednisone to be sent to the pharmacy in case she needs it over the weekend.  Prescription has been sent.  Nothing further needed.

## 2023-11-24 NOTE — Telephone Encounter (Signed)
 Prolia VOB initiated via [email or web address]  Next Prolia inj DUE: 12/24/23

## 2023-11-25 ENCOUNTER — Other Ambulatory Visit: Payer: Self-pay | Admitting: *Deleted

## 2023-11-25 ENCOUNTER — Encounter: Payer: Self-pay | Admitting: *Deleted

## 2023-11-25 DIAGNOSIS — M810 Age-related osteoporosis without current pathological fracture: Secondary | ICD-10-CM

## 2023-11-25 NOTE — Telephone Encounter (Addendum)
 Patient is ready for scheduling on or after: 12/24/23 BUY AND BILL  Out-of-pocket cost due at time of visit: $40  Primary: [employer] Prolia co-insurance: $40 Admin fee co-insurance: $0  Deductible: n/a  Prior Auth: Approved PA Case ID #: [id number] Valid: 12/17/22 -08/23/24    ** This summary of benefits is an estimation of the patient's out-of-pocket cost. Exact cost may vary based on individual plan coverage.

## 2023-12-10 DIAGNOSIS — M810 Age-related osteoporosis without current pathological fracture: Secondary | ICD-10-CM | POA: Diagnosis not present

## 2023-12-11 LAB — BASIC METABOLIC PANEL WITH GFR
BUN/Creatinine Ratio: 16 (ref 12–28)
BUN: 15 mg/dL (ref 8–27)
CO2: 25 mmol/L (ref 20–29)
Calcium: 9.6 mg/dL (ref 8.7–10.3)
Chloride: 105 mmol/L (ref 96–106)
Creatinine, Ser: 0.96 mg/dL (ref 0.57–1.00)
Glucose: 89 mg/dL (ref 70–99)
Potassium: 4.6 mmol/L (ref 3.5–5.2)
Sodium: 144 mmol/L (ref 134–144)
eGFR: 64 mL/min/{1.73_m2} (ref >59)

## 2023-12-11 LAB — VITAMIN D 25 HYDROXY (VIT D DEFICIENCY, FRACTURES): Vit D, 25-Hydroxy: 36.8 ng/mL (ref 30.0–100.0)

## 2023-12-28 ENCOUNTER — Ambulatory Visit: Payer: Medicare PPO | Admitting: Family Medicine

## 2023-12-28 VITALS — Ht 66.0 in

## 2023-12-28 DIAGNOSIS — M810 Age-related osteoporosis without current pathological fracture: Secondary | ICD-10-CM | POA: Diagnosis not present

## 2023-12-28 MED ORDER — DENOSUMAB 60 MG/ML ~~LOC~~ SOSY
60.0000 mg | PREFILLED_SYRINGE | Freq: Once | SUBCUTANEOUS | Status: AC
  Administered 2023-12-28: 60 mg via SUBCUTANEOUS

## 2023-12-28 NOTE — Progress Notes (Signed)
Patient given [place] prolia injection 60mg/ml in her left arm. Patient tolerated injection well without reaction at the injection site. Patient will schedule next injection, which is 6 months from today.  

## 2024-01-07 ENCOUNTER — Ambulatory Visit: Payer: Self-pay | Admitting: Pulmonary Disease

## 2024-01-07 MED ORDER — PREDNISONE 10 MG PO TABS: ORAL_TABLET | ORAL | 0 refills | Status: DC

## 2024-01-07 NOTE — Telephone Encounter (Signed)
 Please advise pt is requesting rx for Prednisone 

## 2024-01-07 NOTE — Telephone Encounter (Signed)
 E2C2 Pulmonary Triage - Initial Assessment Questions "Chief Complaint (e.g., cough, sob, wheezing, fever, chills, sweat or additional symptoms) *Go to specific symptom protocol after initial questions. Patient with hx of asthma calling with concerns for shortness of breath and chest tightness. Endorses dry cough as well. No fever, chills, sweating. Patient reports having to clear her throat. Patient reports having used Pulmicort  and albuterol  inhaler, Mucinex and Flonase without Hammond lot of improvement in symptoms. Patient states she hasn't used her nebulizer.   Oxygen saturation at 98% today on room air. Patient was last on prednisone  was March 2025. Patient is asking for prednisone  to be sent to her pharmacy for today. Discussed with patient the use of nebulizer. Gave care advise information. Patient is asking for Hammond phone call back from office.   "How long have symptoms been present?" Symptoms have been going on for three days  Have you tested for COVID or Flu? Note: If not, ask patient if Hammond home test can be taken. If so, instruct patient to call back for positive results. Yes  MEDICINES:   "Have you used any OTC meds to help with symptoms?" Yes If yes, ask "What medications?" Mucinex, Flonase  "Have you used your inhalers/maintenance medication?" Yes If yes, "What medications?" Albuterol  inhaler/nebulizer Symbicort   If inhaler, ask "How many puffs and how often?" Note: Review instructions on medication in the chart. Albuterol  inhaler 2 puffs every 6 hours PRN Symbicort  2 puffs-BID  OXYGEN: "Do you wear supplemental oxygen?" No If yes, "How many liters are you supposed to use?"   "Do you monitor your oxygen levels?" Yes If yes, "What is your reading (oxygen level) today?" 98%  "What is your usual oxygen saturation reading?"  (Note: Pulmonary O2 sats should be 90% or greater) 96-98%    Copied from CRM [id number]. Topic: Clinical - Red Word Triage >> Jan 07, 2024  8:08 AM Kathleen Hammond  wrote: Red Word that prompted transfer to Nurse Triage: Experiencing SOB & chest tightness.   Patient has been dealing with her asthma for the past 3 days, using Symbicort , albuterol , mucinex, flonase. Reason for Disposition  [1] MODERATE longstanding difficulty breathing (e.g., speaks in phrases, SOB even at rest, pulse 100-120) AND [2] SAME as normal  Answer Assessment - Initial Assessment Questions 1. RESPIRATORY STATUS: "Describe your breathing?" (e.g., wheezing, shortness of breath, unable to speak, severe coughing)      Shortness of breath 2. ONSET: "When did this breathing problem begin?"      Three days ago 3. PATTERN "Does the difficult breathing come and go, or has it been constant since it started?"      constant 4. SEVERITY: "How bad is your breathing?" (e.g., mild, moderate, severe)    - MILD: No SOB at rest, mild SOB with walking, speaks normally in sentences, can lie down, no retractions, pulse < 100.    - MODERATE: SOB at rest, SOB with minimal exertion and prefers to sit, cannot lie down flat, speaks in phrases, mild retractions, audible wheezing, pulse 100-120.    - SEVERE: Very SOB at rest, speaks in single words, struggling to breathe, sitting hunched forward, retractions, pulse > 120      Mild 5. RECURRENT SYMPTOM: "Have you had difficulty breathing before?" If Yes, ask: "When was the last time?" and "What happened that time?"      yes 6. CARDIAC HISTORY: "Do you have any history of heart disease?" (e.g., heart attack, angina, bypass surgery, angioplasty)  7. LUNG HISTORY: "Do you have any history of lung disease?"  (e.g., pulmonary embolus, asthma, emphysema)     asthma 8. CAUSE: "What do you think is causing the breathing problem?"      asthma 9. OTHER SYMPTOMS: "Do you have any other symptoms? (e.g., dizziness, runny nose, cough, chest pain, fever)     Dry cough  Protocols used: Breathing Difficulty-Hammond-AH

## 2024-01-07 NOTE — Addendum Note (Signed)
 Addended by: Antonio Baumgarten on: 01/07/2024 11:14 AM   Modules accepted: Orders

## 2024-01-07 NOTE — Telephone Encounter (Signed)
 Beth saw last 07/2023. Routing to her. Thanks

## 2024-01-07 NOTE — Telephone Encounter (Signed)
 I will send in prednisone  taper  -Continue Symbicort  160mcg 2 puffs twice daily -Use nebulizer twice daily for 3-5 days

## 2024-01-12 NOTE — Telephone Encounter (Signed)
 I called and spoke to pt. Pt did receive her taper and has been using the Symbicort  as prescribed and the neb as well. NFN

## 2024-01-21 DIAGNOSIS — H04123 Dry eye syndrome of bilateral lacrimal glands: Secondary | ICD-10-CM | POA: Diagnosis not present

## 2024-01-21 DIAGNOSIS — H338 Other retinal detachments: Secondary | ICD-10-CM | POA: Diagnosis not present

## 2024-01-21 DIAGNOSIS — H43391 Other vitreous opacities, right eye: Secondary | ICD-10-CM | POA: Diagnosis not present

## 2024-01-21 DIAGNOSIS — G514 Facial myokymia: Secondary | ICD-10-CM | POA: Diagnosis not present

## 2024-01-21 DIAGNOSIS — H10413 Chronic giant papillary conjunctivitis, bilateral: Secondary | ICD-10-CM | POA: Diagnosis not present

## 2024-01-21 DIAGNOSIS — Z961 Presence of intraocular lens: Secondary | ICD-10-CM | POA: Diagnosis not present

## 2024-01-21 DIAGNOSIS — H353131 Nonexudative age-related macular degeneration, bilateral, early dry stage: Secondary | ICD-10-CM | POA: Diagnosis not present

## 2024-01-21 DIAGNOSIS — H5052 Exophoria: Secondary | ICD-10-CM | POA: Diagnosis not present

## 2024-03-01 DIAGNOSIS — H43811 Vitreous degeneration, right eye: Secondary | ICD-10-CM | POA: Diagnosis not present

## 2024-03-01 DIAGNOSIS — H02833 Dermatochalasis of right eye, unspecified eyelid: Secondary | ICD-10-CM | POA: Diagnosis not present

## 2024-03-01 DIAGNOSIS — H353132 Nonexudative age-related macular degeneration, bilateral, intermediate dry stage: Secondary | ICD-10-CM | POA: Diagnosis not present

## 2024-03-01 DIAGNOSIS — Z9889 Other specified postprocedural states: Secondary | ICD-10-CM | POA: Diagnosis not present

## 2024-03-01 DIAGNOSIS — H02836 Dermatochalasis of left eye, unspecified eyelid: Secondary | ICD-10-CM | POA: Diagnosis not present

## 2024-03-15 ENCOUNTER — Other Ambulatory Visit: Payer: Self-pay | Admitting: Pulmonary Disease

## 2024-03-15 ENCOUNTER — Other Ambulatory Visit: Payer: Self-pay | Admitting: Primary Care

## 2024-03-16 DIAGNOSIS — E559 Vitamin D deficiency, unspecified: Secondary | ICD-10-CM | POA: Diagnosis not present

## 2024-03-16 DIAGNOSIS — E782 Mixed hyperlipidemia: Secondary | ICD-10-CM | POA: Diagnosis not present

## 2024-03-22 DIAGNOSIS — J4520 Mild intermittent asthma, uncomplicated: Secondary | ICD-10-CM | POA: Diagnosis not present

## 2024-03-22 DIAGNOSIS — H43812 Vitreous degeneration, left eye: Secondary | ICD-10-CM | POA: Diagnosis not present

## 2024-03-22 DIAGNOSIS — M810 Age-related osteoporosis without current pathological fracture: Secondary | ICD-10-CM | POA: Diagnosis not present

## 2024-03-22 DIAGNOSIS — E559 Vitamin D deficiency, unspecified: Secondary | ICD-10-CM | POA: Diagnosis not present

## 2024-03-22 DIAGNOSIS — M722 Plantar fascial fibromatosis: Secondary | ICD-10-CM | POA: Diagnosis not present

## 2024-03-22 DIAGNOSIS — M75101 Unspecified rotator cuff tear or rupture of right shoulder, not specified as traumatic: Secondary | ICD-10-CM | POA: Diagnosis not present

## 2024-03-22 DIAGNOSIS — R944 Abnormal results of kidney function studies: Secondary | ICD-10-CM | POA: Diagnosis not present

## 2024-03-22 DIAGNOSIS — Z0001 Encounter for general adult medical examination with abnormal findings: Secondary | ICD-10-CM | POA: Diagnosis not present

## 2024-03-22 DIAGNOSIS — K580 Irritable bowel syndrome with diarrhea: Secondary | ICD-10-CM | POA: Diagnosis not present

## 2024-03-28 ENCOUNTER — Encounter: Payer: Self-pay | Admitting: Pulmonary Disease

## 2024-03-28 DIAGNOSIS — J4541 Moderate persistent asthma with (acute) exacerbation: Secondary | ICD-10-CM

## 2024-03-28 DIAGNOSIS — E8801 Alpha-1-antitrypsin deficiency: Secondary | ICD-10-CM

## 2024-03-28 NOTE — Telephone Encounter (Signed)
**Note De-identified  [place] Obfuscation** Please advise 

## 2024-04-09 NOTE — Telephone Encounter (Signed)
 Please schedule PFTs on day of return visit or before return visit for possible I have also ordered alpha-1 antitrypsin levels and CMP.  Let her know to come to the lab to get those tests collected.  Thank you

## 2024-04-11 NOTE — Telephone Encounter (Signed)
 Spoke to patient and relayed need for labs, PFT and OV.  Lab scheduled 04/29/2024. PFT scheduled 9/25, as there was no availability the day of appt for PFT on 9/26.

## 2024-04-13 ENCOUNTER — Other Ambulatory Visit: Payer: Self-pay | Admitting: Physician Assistant

## 2024-04-13 ENCOUNTER — Telehealth: Admitting: Physician Assistant

## 2024-04-13 DIAGNOSIS — J019 Acute sinusitis, unspecified: Secondary | ICD-10-CM | POA: Diagnosis not present

## 2024-04-13 DIAGNOSIS — B9689 Other specified bacterial agents as the cause of diseases classified elsewhere: Secondary | ICD-10-CM | POA: Diagnosis not present

## 2024-04-13 MED ORDER — PREDNISONE 20 MG PO TABS: 40.0000 mg | ORAL_TABLET | Freq: Every day | ORAL | 0 refills | Status: DC

## 2024-04-13 MED ORDER — AZITHROMYCIN 250 MG PO TABS: ORAL_TABLET | ORAL | 0 refills | Status: AC

## 2024-04-13 MED ORDER — DOXYCYCLINE HYCLATE 100 MG PO TABS: 100.0000 mg | ORAL_TABLET | Freq: Two times a day (BID) | ORAL | 0 refills | Status: DC

## 2024-04-13 NOTE — Progress Notes (Signed)
 I have spent 5 minutes in review of e-visit questionnaire, review and updating patient chart, medical decision making and response to patient.   Elsie Velma Lunger, PA-C

## 2024-04-13 NOTE — Progress Notes (Signed)
 E-Visit for Sinus Problems  We are sorry that you are not feeling well.  Here is how we plan to help!  Based on what you have shared with me it looks like you have sinusitis.  Sinusitis is inflammation and infection in the sinus cavities of the head.  Based on your presentation I believe you most likely have Acute Bacterial Sinusitis.  This is an infection caused by bacteria and is treated with antibiotics. I have prescribed Doxycycline  100mg  by mouth twice a day for 10 days. -- this has a broader coverage for the sinuses and the lower respiratory tract that just a z-pac. I have also sent in a short course of prednisone  to take as directed. You may use an oral decongestant such as Mucinex D or if you have glaucoma or high blood pressure use plain Mucinex. Saline nasal spray help and can safely be used as often as needed for congestion.  If you develop worsening sinus pain, fever or notice severe headache and vision changes, or if symptoms are not better after completion of antibiotic, please schedule an appointment with a health care provider.    Sinus infections are not as easily transmitted as other respiratory infection, however we still recommend that you avoid close contact with loved ones, especially the very young and elderly.  Remember to wash your hands thoroughly throughout the day as this is the number one way to prevent the spread of infection!  Home Care: Only take medications as instructed by your medical team. Complete the entire course of an antibiotic. Do not take these medications with alcohol. A steam or ultrasonic humidifier can help congestion.  You can place a towel over your head and breathe in the steam from hot water coming from a faucet. Avoid close contacts especially the very young and the elderly. Cover your mouth when you cough or sneeze. Always remember to wash your hands.  Get Help Right Away If: You develop worsening fever or sinus pain. You develop a severe head  ache or visual changes. Your symptoms persist after you have completed your treatment plan.  Make sure you Understand these instructions. Will watch your condition. Will get help right away if you are not doing well or get worse.  Thank you for choosing an e-visit.  Your e-visit answers were reviewed by a board certified advanced clinical practitioner to complete your personal care plan. Depending upon the condition, your plan could have included both over the counter or prescription medications.  Please review your pharmacy choice. Make sure the pharmacy is open so you can pick up prescription now. If there is a problem, you may contact your provider through [employer] and have the prescription routed to another pharmacy.  Your safety is important to us . If you have drug allergies check your prescription carefully.   For the next 24 hours you can use MyChart to ask questions about today's visit, request a non-urgent call back, or ask for a work or school excuse. You will get an email in the next two days asking about your experience. I hope that your e-visit has been valuable and will speed your recovery.

## 2024-05-01 ENCOUNTER — Encounter: Payer: Self-pay | Admitting: Pulmonary Disease

## 2024-05-01 ENCOUNTER — Other Ambulatory Visit (INDEPENDENT_AMBULATORY_CARE_PROVIDER_SITE_OTHER)

## 2024-05-01 DIAGNOSIS — Z1231 Encounter for screening mammogram for malignant neoplasm of breast: Secondary | ICD-10-CM

## 2024-05-01 DIAGNOSIS — E8801 Alpha-1-antitrypsin deficiency: Secondary | ICD-10-CM | POA: Diagnosis not present

## 2024-05-01 DIAGNOSIS — J4541 Moderate persistent asthma with (acute) exacerbation: Secondary | ICD-10-CM | POA: Diagnosis not present

## 2024-05-01 LAB — COMPREHENSIVE METABOLIC PANEL WITH GFR
ALT: 15 U/L (ref 0–35)
AST: 18 U/L (ref 0–37)
Albumin: 4.4 g/dL (ref 3.5–5.2)
Alkaline Phosphatase: 62 U/L (ref 39–117)
BUN: 15 mg/dL (ref 6–23)
CO2: 26 meq/L (ref 19–32)
Calcium: 9.4 mg/dL (ref 8.4–10.5)
Chloride: 105 meq/L (ref 96–112)
Creatinine, Ser: 0.89 mg/dL (ref 0.40–1.20)
GFR: 65.59 mL/min (ref >60.00)
Glucose, Bld: 106 mg/dL — ABNORMAL HIGH (ref 70–99)
Potassium: 3.8 meq/L (ref 3.5–5.1)
Sodium: 140 meq/L (ref 135–145)
Total Bilirubin: 0.6 mg/dL (ref 0.2–1.2)
Total Protein: 6.9 g/dL (ref 6.0–8.3)

## 2024-05-02 LAB — ALPHA-1-ANTITRYPSIN: A-1 Antitrypsin, Ser: 89 mg/dL (ref 83–199)

## 2024-05-02 NOTE — Telephone Encounter (Signed)
**Note De-identified  [place] Obfuscation** Please advise 

## 2024-05-04 ENCOUNTER — Other Ambulatory Visit: Payer: Self-pay | Admitting: Obstetrics and Gynecology

## 2024-05-04 DIAGNOSIS — Z1231 Encounter for screening mammogram for malignant neoplasm of breast: Secondary | ICD-10-CM

## 2024-05-11 ENCOUNTER — Telehealth: Admitting: Physician Assistant

## 2024-05-11 ENCOUNTER — Ambulatory Visit: Payer: Self-pay | Admitting: Pulmonary Disease

## 2024-05-11 DIAGNOSIS — J019 Acute sinusitis, unspecified: Secondary | ICD-10-CM

## 2024-05-11 NOTE — Telephone Encounter (Signed)
 FYI Only or Action Required?: FYI only for provider.  Patient is followed in Pulmonology for asthma, last seen on 07/27/2023 by Hope Almarie ORN, NP.  Called Nurse Triage reporting Shortness of Breath.  Symptoms began several days ago.  Interventions attempted: OTC medications: tylenol .  Symptoms are: gradually worsening.  Triage Disposition: See Physician Within 24 Hours  Patient/caregiver understands and will follow disposition?: Yes       Copied from CRM [id number]. Topic: Clinical - Red Word Triage >> May 11, 2024 10:09 AM Kathleen Hammond wrote: Red Word that prompted transfer to Nurse Triage: Chest tightness and SOB, has had Hammond congested sinuses for Hammond few days Reason for Disposition  Earache  Answer Assessment - Initial Assessment Questions E2C2 Pulmonary Triage - Initial Assessment Questions Chief Complaint (e.g., cough, sob, wheezing, fever, chills, sweat or additional symptoms) *Go to specific symptom protocol after initial questions. Sinus congestion, SOB  How long have symptoms been present? Hammond few days  Have you tested for COVID or Flu? Note: If not, ask patient if Hammond home test can be taken. If so, instruct patient to call back for positive results. No  MEDICINES:   Have you used any OTC meds to help with symptoms? No If yes, ask What medications? tylenol   Have you used your inhalers/maintenance medication? Yes If yes, What medications? budesonide -formoterol  (SYMBICORT ) - uses PRN Albuterol  INH - has not used recently [facility] reviewed/reinforced albuterol  usage and SIG.    If inhaler, ask How many puffs and how often? Note: Review instructions on medication in the chart. See above  OXYGEN: Do you wear supplemental oxygen? No If yes, How many liters are you supposed to use? N/a  Do you monitor your oxygen levels? Yes If yes, What is your reading (oxygen level) today? 94  What is your usual oxygen saturation reading?  (Note:  Pulmonary O2 sats should be 90% or greater) 98-99      1. LOCATION: Where does it hurt?      Teeth, head, sinus congestion, earache, chest tightness [facility] does not appreciate audible SOB/wheezing during call. Pt is speaking in full sentences.  2. ONSET: When did the sinus pain start?  (e.g., hours, days)      See above 3. SEVERITY: How bad is the pain?   (Scale 0-10; or none, mild, moderate or severe)     *No Answer* 4. RECURRENT SYMPTOM: Have you ever had sinus problems before? If Yes, ask: When was the last time? and What happened that time?      Yes, typically gets abx, steroids with relief of sx 5. NASAL CONGESTION: Is the nose blocked? If Yes, ask: Can you open it or must you breathe through your mouth?     Sinus congestion 6. NASAL DISCHARGE: Do you have discharge from your nose? If so ask, What color?     denies 7. FEVER: Do you have Hammond fever? If Yes, ask: What is it, how was it measured, and when did it start?      denies 8. OTHER SYMPTOMS: Do you have any other symptoms? (e.g., sore throat, cough, earache, difficulty breathing)     SOB 9. PREGNANCY: Is there any chance you are pregnant? When was your last menstrual period?     N/Hammond  Protocols used: Sinus Pain or Congestion-Hammond-AH

## 2024-05-11 NOTE — Progress Notes (Signed)
  Because of your symptoms and drug allergies, I feel your condition warrants further evaluation and I recommend that you be seen in a face-to-face visit.   NOTE: There will be NO CHARGE for this E-Visit   If you are having a true medical emergency, please call 911.     For an urgent face to face visit, [place] has multiple urgent care centers for your convenience.  Click the link below for the full list of locations and hours, walk-in wait times, appointment scheduling options and driving directions:  Urgent Care - [place], [place], [place], [place], [place], [place]  [place]     Your MyChart E-visit questionnaire answers were reviewed by a board certified advanced clinical practitioner to complete your personal care plan based on your specific symptoms.    Thank you for using e-Visits.

## 2024-05-11 NOTE — Telephone Encounter (Signed)
 Has appointment tomorrow with sarah groce.NFN

## 2024-05-12 ENCOUNTER — Encounter: Payer: Self-pay | Admitting: Acute Care

## 2024-05-12 ENCOUNTER — Ambulatory Visit: Admitting: Acute Care

## 2024-05-12 VITALS — BP 108/70 | HR 89 | Temp 98.2°F | Ht 66.0 in | Wt 175.2 lb

## 2024-05-12 DIAGNOSIS — J45901 Unspecified asthma with (acute) exacerbation: Secondary | ICD-10-CM | POA: Diagnosis not present

## 2024-05-12 DIAGNOSIS — R509 Fever, unspecified: Secondary | ICD-10-CM

## 2024-05-12 DIAGNOSIS — E8801 Alpha-1-antitrypsin deficiency: Secondary | ICD-10-CM

## 2024-05-12 DIAGNOSIS — J45909 Unspecified asthma, uncomplicated: Secondary | ICD-10-CM

## 2024-05-12 DIAGNOSIS — R52 Pain, unspecified: Secondary | ICD-10-CM

## 2024-05-12 DIAGNOSIS — R0602 Shortness of breath: Secondary | ICD-10-CM

## 2024-05-12 DIAGNOSIS — J069 Acute upper respiratory infection, unspecified: Secondary | ICD-10-CM

## 2024-05-12 LAB — POCT INFLUENZA A/B

## 2024-05-12 LAB — POCT EXHALED NITRIC OXIDE: FeNO level (ppb): 21

## 2024-05-12 MED ORDER — ALBUTEROL SULFATE (2.5 MG/3ML) 0.083% IN NEBU: 2.5000 mg | INHALATION_SOLUTION | Freq: Four times a day (QID) | RESPIRATORY_TRACT | 2 refills | Status: AC | PRN

## 2024-05-12 MED ORDER — BUDESONIDE-FORMOTEROL FUMARATE 160-4.5 MCG/ACT IN AERO: 2.0000 | INHALATION_SPRAY | Freq: Two times a day (BID) | RESPIRATORY_TRACT | 3 refills | Status: DC

## 2024-05-12 MED ORDER — ALBUTEROL SULFATE HFA 108 (90 BASE) MCG/ACT IN AERS: 1.0000 | INHALATION_SPRAY | Freq: Four times a day (QID) | RESPIRATORY_TRACT | 6 refills | Status: DC | PRN

## 2024-05-12 MED ORDER — AZITHROMYCIN 250 MG PO TABS: ORAL_TABLET | ORAL | 0 refills | Status: DC

## 2024-05-12 MED ORDER — PREDNISONE 20 MG PO TABS: ORAL_TABLET | ORAL | 0 refills | Status: DC

## 2024-05-12 NOTE — Patient Instructions (Signed)
 It is good to see you today. I have sent in a prescription in to renew your Symbicort , Albuterol  nebs and inhaler. I have also sent in a z pack. Take 2 tablets today and 1 daily for the next 4 days until gone.  I have sent in prednisone . Take 40 mg daily x 5 days. Drink plenty of fluids , and get rest. We will also test you for flu.  Your FENO was 21 BBP , which is slightly above normal.  Follow up for PFT's when you feel ready.  Call if you do not get better.  Feel better!! Please contact office for sooner follow up if symptoms do not improve or worsen or seek emergency care

## 2024-05-12 NOTE — Progress Notes (Signed)
 History of Present Illness Kathleen Hammond is a 68 y.o. 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 She is followed by Dr. Theophilus.   Maintenance Singulair at bedtime. Uses Symbicort  and Albuterol  as needed for flares.    05/12/2024 Discussed the use of AI scribe software for clinical note transcription with the patient, who gave verbal consent to proceed.  History of Present Illness Pt. Presents for an acute visit for shortness of breath , chest tightness and sinus congestion that has become worse over the last several days.She had a fever of 100 yesterday. She is afebrile today.She endorses body aches.    Kathleen Hammond is a 68 year old female with alpha-1 antitrypsin deficiency who presents with asthma exacerbation.  She has a history of alpha-1 antitrypsin deficiency, primarily manifesting as asthma. Her maintenance medication includes Singulair at bedtime, and she uses Symbicort  and albuterol  inhalers as needed for asthma flares. Both her Symbicort  and albuterol  inhalers expired in July or August, and she has not used her nebulizer in a while, with the albuterol  for the nebulizer expiring last December.  Her current symptoms include difficulty drawing a deep breath and tightness in the upper chest, which she describes as her 'normal asthma.' She has a dry, nonproductive cough that started this morning. She tested for COVID-19 yesterday at home.We tested her today for flu which was also negative.  Fall is her worst season for allergies, particularly ragweed and goldenrod, and she recently experienced high pollen counts while at the beach. She has a history of using a burst pack of prednisone , 40 mg for five days, which she finds effective. She also notes that a Z-Pak has worked well for her in the past for similar symptoms.  Her current medications include Singulair at bedtime, and she uses Symbicort  and albuterol  inhalers as needed.   We did a FENO,  result was 21 BBP, mildly elevated. We will treat with Prednisone . We will alsotreat with Z pack as she has had increased congestion     Test Results: 05/12/2024 FENO>> 21 PPB>> Mildly elevated     Latest Ref Rng & Units 10/15/2020   11:19 AM 06/14/2019   10:57 AM 09/02/2017   12:00 AM  CBC  WBC 4.0 - 10.5 K/uL 11.6  4.6  4.6      Hemoglobin 12.0 - 15.0 g/dL 86.0  86.7  86.0      Hematocrit 36.0 - 46.0 % 41.5  39.0  42      Platelets 150.0 - 400.0 K/uL 270.0  224.0  263         This result is from an external source.       Latest Ref Rng & Units 05/01/2024    8:56 AM 12/10/2023    8:09 AM 06/11/2023   11:50 AM  BMP  Glucose 70 - 99 mg/dL 893  89  87   BUN 6 - 23 mg/dL 15  15  20    Creatinine 0.40 - 1.20 mg/dL 9.10  9.03  9.16   BUN/Creat Ratio 12 - 28  16  24    Sodium 135 - 145 mEq/L 140  144  142   Potassium 3.5 - 5.1 mEq/L 3.8  4.6  3.8   Chloride 96 - 112 mEq/L 105  105  101   CO2 19 - 32 mEq/L 26  25  25    Calcium 8.4 - 10.5 mg/dL 9.4  9.6  9.3     BNP No results  found for: BNP  ProBNP No results found for: PROBNP  PFT    Component Value Date/Time   FEV1PRE 2.05 05/04/2023 1200   FEV1POST 2.08 05/04/2023 1200   FVCPRE 2.67 05/04/2023 1200   FVCPOST 2.63 05/04/2023 1200   TLC 4.40 05/04/2023 1200   DLCOUNC 14.38 05/04/2023 1200   PREFEV1FVCRT 77 05/04/2023 1200   PSTFEV1FVCRT 79 05/04/2023 1200    No results found.   Past medical hx Past Medical History:  Diagnosis Date   Alpha-1-antitrypsin deficiency (HCC)    Asthma    History of anemia    Hyperlipidemia    Irritable bowel    Osteoporosis    Perimenopausal vasomotor symptoms    Postmenopausal    Urinary incontinence      Social History   Tobacco Use   Smoking status: Never    Passive exposure: Past   Smokeless tobacco: Never  Substance Use Topics   Alcohol use: Yes    Comment: occasional   Drug use: No    Ms.Tison reports that she has never smoked. She has been exposed to  tobacco smoke. She has never used smokeless tobacco. She reports current alcohol use. She reports that she does not use drugs.  Tobacco Cessation: Counseling given: Not Answered Never smoker   Past surgical hx, Family hx, Social hx all reviewed.  Current Outpatient Medications on File Prior to Visit  Medication Sig   PANTOPRAZOLE SODIUM PO Take 40 mg by mouth daily.   rosuvastatin (CRESTOR) 5 MG tablet 10 mg.   SINGULAIR 10 MG tablet Take 1 tablet by mouth at bedtime.    No current facility-administered medications on file prior to visit.     Allergies  Allergen Reactions   Cefuroxime Axetil Shortness Of Breath   Iodine Shortness Of Breath   Alendronate Sodium Swelling   Augmentin  [Amoxicillin -Pot Clavulanate] Other (See Comments)    Pt c/o tingling in lips   Avelox [Moxifloxacin Hcl In Nacl] Other (See Comments)    Hallucinations    Macrodantin [Nitrofurantoin Macrocrystal] Nausea Only    Chills, aching   Sulfamethoxazole-Trimethoprim Other (See Comments)   Bactrim Rash    Septra   Ceclor [Cefaclor] Rash    Review Of Systems:  Constitutional:   No  weight loss, night sweats, +  Fevers, No chills, + fatigue, or  lassitude.  HEENT:   No headaches,  Difficulty swallowing,  Tooth/dental problems, or  Sore throat,                No sneezing, itching, ear ache, + nasal congestion, post nasal drip,   CV:  +  chest tightness,  No Orthopnea, PND, swelling in lower extremities, anasarca, dizziness, palpitations, syncope.   GI  No heartburn, indigestion, abdominal pain, nausea, vomiting, diarrhea, change in bowel habits, loss of appetite, bloody stools.   Resp: + shortness of breath with exertion or at rest.+ Chest tightness,   + excess mucus, + productive cough,  No non-productive cough,  No coughing up of blood.  No change in color of mucus.  No wheezing.  No chest wall deformity  Skin: no rash or lesions.  GU: no dysuria, change in color of urine, no urgency or frequency.   No flank pain, no hematuria   MS:  No joint pain or swelling.  No decreased range of motion.  No back pain.+ body aches  Psych:  No change in mood or affect. No depression or anxiety.  No memory loss.   Vital Signs BP 108/70  Pulse 89   Temp 98.2 F (36.8 C) (Oral)   Ht 5' 6 (1.676 m)   Wt 175 lb 3.2 oz (79.5 kg)   SpO2 97%   BMI 28.28 kg/m    Physical Exam:  General- No distress,  A&Ox3, very pleasant ENT: No sinus tenderness, TM clear, pale nasal mucosa, no oral exudate,no post nasal drip, no LAN Cardiac: S1, S2, regular rate and rhythm, no murmur Chest: No wheeze/ rales/ dullness; no accessory muscle use, no nasal flaring, no sternal retractions, few rhonchi noted upper lobes, clear with cough Abd.: Soft Non-tender, ND, BS +, Body mass index is 28.28 kg/m.  Ext: No clubbing cyanosis, edema, no obvious deformities Neuro:  normal strength, MAE x 4, A&O x 3,  Skin: No rashes, warm and dry, no obvious skin lesions  Psych: normal mood and behavior Assessment & Plan Asthma with acute exacerbation in the setting of alpha-1 antitrypsin deficiency Acute exacerbation likely due to viral infection and seasonal allergies.  Mild airway inflammation indicated by FENO of 21 PPB  Alpha-1 antitrypsin deficiency complicates management. - Renew Symbicort  inhaler with 3 refills. - Renew albuterol  inhaler with 6 refills. - Renew albuterol  for nebulizer. - Prescribe prednisone  40 mg daily for 5 days. - Prescribe azithromycin  (Z-Pak): 2 tablets on day 1, then 1 tablet daily for the next 4 days. - Advise hydration and rest. - Instruct to wear a mask if going out. - Perform flu test.>> Negative   Allergic rhinitis Exacerbated by seasonal allergens, particularly severe in fall. - Continue Singulair at bedtime for allergy management.  AVS 05/12/2024 I have sent in a prescription in to renew your Symbicort , Albuterol  nebs and inhaler. I have also sent in a z pack. Take 2 tablets today and  1 daily for the next 4 days until gone.  I have sent in prednisone . Take 40 mg daily x 5 days. Drink plenty of fluids , and get rest. We will also test you for flu.  Your FENO was 21 BBP , which is slightly above normal.  Follow up for PFT's when you feel ready.  Call if you do not get better.  Feel better!! Please contact office for sooner follow up if symptoms do not improve or worsen or seek emergency care    I spent 25 minutes dedicated to the care of this patient on the date of this encounter to include pre-visit review of records, face-to-face time with the patient discussing conditions above, post visit ordering of testing, clinical documentation with the electronic health record, making appropriate referrals as documented, and communicating necessary information to the patient's healthcare team.      Lauraine JULIANNA Lites, NP 05/12/2024  4:48 PM

## 2024-05-15 ENCOUNTER — Telehealth: Payer: Self-pay

## 2024-05-15 NOTE — Telephone Encounter (Signed)
 Copied from CRM [id number]. Topic: Clinical - Prescription Issue >> May 12, 2024  4:53 PM Devaughn RAMAN wrote: Reason for CRM: Patient called in regarding medications NP Groce sent to the pharmacy from her visit on today and she stated [facility] does not have all the medications. According to note NP Groce prescribed patient the medications below and [facility] only has 1 medication. Contacted Nurse Triage and was advised from Nurse Becky to see if patient can have medications sent to a different [facility]. Becky advised for patient to call [facility] and have them transfer them to a different pharmacy and if she needs more help to give a callback and an call can be made to the on call staff.  Renew Symbicort  inhaler with 3 refills. Renew albuterol  inhaler with 6 refills. Renew albuterol  for nebulizer. Prescribe prednisone  40 mg daily for 5 days. Prescribe azithromycin  (Z-Pak): 2 tablets on day 1, then 1 tablet daily for the next 4 days.  ATC x1. LVMTCB.

## 2024-05-17 ENCOUNTER — Ambulatory Visit: Payer: Self-pay | Admitting: Acute Care

## 2024-05-17 NOTE — Telephone Encounter (Signed)
 Called patient,scheduled acute visit with dr.mannam to futher evaluate

## 2024-05-17 NOTE — Telephone Encounter (Signed)
 FYI Only or Action Required?: Action required by provider: requesting recommendations from provider.  Patient is followed in Pulmonology for asthma, last seen on 05/12/2024 by [name], NP.  Called Nurse Triage reporting Shortness of Breath.  Symptoms began several days ago.  Interventions attempted: Rescue inhaler, Maintenance inhaler, and Nebulizer treatments.  Symptoms are: unchanged.  Triage Disposition: See HCP Within 4 Hours (Or PCP Triage)  Patient/caregiver understands and will follow disposition?: No, wishes to speak with PCP  Copied from CRM #[id number]. Topic: Clinical - Red Word Triage >> May 17, 2024 12:03 PM Kathleen Hammond wrote: Red Word that prompted transfer to Nurse Triage: Patient still experiencing tightness in her chest and SOB after completing medicines she was prescribed. Reason for Disposition  [1] MILD difficulty breathing (e.g., minimal/no SOB at rest, SOB with walking, pulse < 100) AND [2] NEW-onset or WORSE than normal  Answer Assessment - Initial Assessment Questions Patient reports finishing the antibiotic and prednisone  she received at her last visit with pulmonary. Patient states she is still having shortness of breath and chest tightness. Reports using her maintenance and albuterol  inhaler this morning. Patient states SOB is moderate-patient is speaking in full sentences with minimal distress. Discussed with patient her nebulizer treatments. Patient states she will use her nebulizer after getting of the of the phone. No availability currently for pulmonary office. Spoke with patient about being seen in PCP office or Urgent Care. Patient is concerned with going to Urgent Care with increased numbers of COVID positive. Patient would prefer recommendations from provider. Asking for Hammond phone call back  1. RESPIRATORY STATUS: Describe your breathing? (e.g., wheezing, shortness of breath, unable to speak, severe coughing)      Shortness of breath 2. ONSET: When did  this breathing problem begin?      Started last Thursday 3. PATTERN Does the difficult breathing come and go, or has it been constant since it started?      constant 4. SEVERITY: How bad is your breathing? (e.g., mild, moderate, severe)      moderate 5. RECURRENT SYMPTOM: Have you had difficulty breathing before? If Yes, ask: When was the last time? and What happened that time?      no 6. CARDIAC HISTORY: Do you have any history of heart disease? (e.g., heart attack, angina, bypass surgery, angioplasty)      no 7. LUNG HISTORY: Do you have any history of lung disease?  (e.g., pulmonary embolus, asthma, emphysema)     asthma 8. CAUSE: What do you think is causing the breathing problem?      unsure 9. OTHER SYMPTOMS: Do you have any other symptoms? (e.g., chest pain, cough, dizziness, fever, runny nose)     Chest tightness 10. O2 SATURATION MONITOR:  Do you use an oxygen saturation monitor (pulse oximeter) at home? If Yes, ask: What is your reading (oxygen level) today? What is your usual oxygen saturation reading? (e.g., 95%)       97% on RA 12. TRAVEL: Have you traveled out of the country in the last month? (e.g., travel history, exposures)       no  Protocols used: Breathing Difficulty-Hammond-AH

## 2024-05-18 ENCOUNTER — Encounter

## 2024-05-19 ENCOUNTER — Ambulatory Visit (INDEPENDENT_AMBULATORY_CARE_PROVIDER_SITE_OTHER)

## 2024-05-19 ENCOUNTER — Ambulatory Visit: Admitting: Pulmonary Disease

## 2024-05-19 ENCOUNTER — Encounter: Payer: Self-pay | Admitting: Pulmonary Disease

## 2024-05-19 VITALS — BP 102/84 | HR 92 | Temp 98.2°F | Ht 66.0 in | Wt 176.0 lb

## 2024-05-19 DIAGNOSIS — J4541 Moderate persistent asthma with (acute) exacerbation: Secondary | ICD-10-CM | POA: Diagnosis not present

## 2024-05-19 DIAGNOSIS — Z148 Genetic carrier of other disease: Secondary | ICD-10-CM

## 2024-05-19 DIAGNOSIS — J45901 Unspecified asthma with (acute) exacerbation: Secondary | ICD-10-CM | POA: Diagnosis not present

## 2024-05-19 DIAGNOSIS — R053 Chronic cough: Secondary | ICD-10-CM | POA: Diagnosis not present

## 2024-05-19 DIAGNOSIS — J189 Pneumonia, unspecified organism: Secondary | ICD-10-CM | POA: Diagnosis not present

## 2024-05-19 DIAGNOSIS — R0602 Shortness of breath: Secondary | ICD-10-CM

## 2024-05-19 MED ORDER — PREDNISONE 10 MG PO TABS: 60.0000 mg | ORAL_TABLET | Freq: Every day | ORAL | 0 refills | Status: DC

## 2024-05-19 NOTE — Patient Instructions (Signed)
  VISIT SUMMARY: You were seen today for persistent shortness of breath and chest tightness related to your asthma. Despite completing a course of azithromycin  and prednisone , your symptoms have only slightly improved.  YOUR PLAN: ASTHMA EXACERBATION: You are experiencing a worsening of your asthma symptoms, including chest tightness and shortness of breath. -We will order a chest x-ray to rule out pneumonia. -You will start a new course of prednisone , beginning at 60 mg with a slow taper over 12 days. -Stay well hydrated. -You can use over-the-counter medications like Mucinex and Delsym for your cough and congestion. -If the chest x-ray shows any abnormalities, we may prescribe additional antibiotics.

## 2024-05-19 NOTE — Progress Notes (Signed)
 Kathleen Hammond    [id number]    19-Mar-1956  Primary Care Physician:Hall, Norleen PEDLAR, MD  Referring Physician: Shona Norleen PEDLAR, MD [address] [place],  [place] [address]   Chief complaint: Follow-up for asthma, alpha-1 antitrypsin deficiency  HPI: 68 y.o. 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  Diagnosed over 10 years ago.    Has been asymptomatic with no inhaler therapy.  She was also evaluated at [facility] and advised that she did not need replacement therapy.  Has history of asthma which appears to be mild.  Previously on Symbicort  but currently on albuterol  with good control of symptoms  In November 2021 she required prednisone  for 5 days after she developed a flareup of asthma after Moderna booster  Interim history: Discussed the use of AI scribe software for clinical note transcription with the patient, who gave verbal consent to proceed.  History of Present Illness Kathleen Hammond is a 68 year old female with asthma who presents with persistent shortness of breath and chest tightness. She was referred by Lauraine Schultze for evaluation of persistent asthma symptoms despite treatment.  Asthma exacerbation - Persistent shortness of breath and chest tightness since earlier this month - Completed a course of azithromycin  (Z-Pak) and a five-day course of prednisone  with minimal improvement in symptoms - Current maintenance therapy includes Symbicort , two puffs twice daily, and Singulair nightly - Uses nebulizer and Symbicort  inhaler as needed - Continues to experience significant chest tightness and dyspnea despite current regimen  Respiratory symptoms - Intermittent hoarseness of voice - Non-productive, 'hacky' cough - Low-grade fever last weekend, resolved after completion of Z-Pak   Relevant History Pets: No pets Occupation: Retired school principal Exposures: No known exposures.  No mold, hot tub, Jacuzzi Smoking history: Smoker Travel history:  Significant travel history Relevant family history: No significant family history of lung disease.  Outpatient Encounter Medications as of 05/19/2024  Medication Sig   albuterol  (PROVENTIL ) (2.5 MG/3ML) 0.083% nebulizer solution Take 3 mLs (2.5 mg total) by nebulization every 6 (six) hours as needed for wheezing or shortness of breath.   albuterol  (VENTOLIN  HFA) 108 (90 Base) MCG/ACT inhaler Inhale 1-2 puffs into the lungs every 6 (six) hours as needed for wheezing or shortness of breath.   budesonide -formoterol  (SYMBICORT ) 160-4.5 MCG/ACT inhaler Inhale 2 puffs into the lungs 2 (two) times daily.   PANTOPRAZOLE SODIUM PO Take 40 mg by mouth daily.   rosuvastatin (CRESTOR) 5 MG tablet 10 mg.   SINGULAIR 10 MG tablet Take 1 tablet by mouth at bedtime.    azithromycin  (ZITHROMAX ) 250 MG tablet Take 2 tablets day one, and one tablet daily for 4 days until gone. (Patient not taking: Reported on 05/19/2024)   predniSONE  (DELTASONE ) 20 MG tablet Take 2 tablets in the morning after breakfast x 5 days (Patient not taking: Reported on 05/19/2024)   No facility-administered encounter medications on file as of 05/19/2024.    Vitals:   05/19/24 1031  BP: 102/84  Pulse: 92  Temp: 98.2 F (36.8 C)  Height: 5' 6 (1.676 m)  Weight: 176 lb (79.8 kg)  SpO2: 98%  TempSrc: Oral  BMI (Calculated): 28.42     Physical Exam GEN: No acute distress. CV: Regular rate and rhythm, no murmurs. LUNGS: Mild crackles, no wheeze. SKIN JOINTS: Warm and dry, no rash.    Data Reviewed: Imaging: Chest x-ray 04/06/2012-no acute cardiopulmonary abnormality CT abdomen pelvis 05/22/13-visualized lung bases are normal.  Chest x-ray 06/14/2019-no acute cardiopulmonary abnormality I have reviewed the images personally.  PFTs: 08/03/2018 FVC 3.29 [95%], FEV1 2.45 [92%], F/F 75, TLC 5.16 [96%], DLCO 14.58 [54%) No obstruction or restriction.  Moderate diffusion impairment  10/15/2020 FVC 3.11 [92%], FEV1 2.40 [93%], F/F  77, TLC 4.75 [88%], DLCO 17.71 [4%] Normal test  05/04/2023 FVC 2.63 [80%], FEV1 2.08 [83%], F/F79, TLC 4.40 [82%], DLCO 14.38 [68%] Mild diffusion defect  ACT score 05/04/2023- 22  Labs: Alpha-1 antitrypsin 06/14/2019-72, PI MZ Hepatic panel 06/14/2019-within normal limits  CBC 06/13/2020- WBC 4.6, eos 8.8%, absolute eosinophil count 405 CBC 10/15/2020-WBC 11.6, eos 0.6%, absolute eosinophil count 70  Assessment and Plan Assessment & Plan Asthma exacerbation Experiencing an exacerbation characterized by persistent chest tightness and shortness of breath. Completed azithromycin  and a 5-day prednisone  course with some improvement but ongoing symptoms. Sinus infection symptoms and recent exposure to a large group may have contributed. No significant productive cough or deep bronchitis cough. Low-grade fever resolved post-azithromycin . Examination reveals slight crackle, no wheeze, suggesting prolonged recovery rather than acute infection like pneumonia. - Order chest x-ray to rule out pneumonia. - Prescribe prednisone  starting at 60 mg with a slow taper over 12 days. - Advise to stay well hydrated. - Continue Symbicort , Singulair - Recommend over-the-counter medications like Mucinex and Delsym for cough and congestion. - If chest x-ray is abnormal, consider prescribing additional antibiotics.  Alpha-1 antitrypsin carrier status Follow-up PFT reviewed with no obstruction Repeat alpha-1 antitrypsin levels and LFTs at return visit  Plan/Recommendations: - Chest x-ray - Prednisone  - Continue Symbicort , Singulair  Lonna Coder MD [facility] 06/25/2020, 8:47 AM

## 2024-05-22 ENCOUNTER — Telehealth: Payer: Self-pay

## 2024-05-22 NOTE — Telephone Encounter (Signed)
 Copied from CRM #[id number]. Topic: Clinical - Lab/Test Results >> May 22, 2024  8:57 AM Devaughn RAMAN wrote: Reason for CRM: Patient calling in regarding her chest X-ray results. Patient stated she had a chest x-ray to rule out pneumonia and was advised to check MyChart for the results, patient stated she did not see results in MyChart and wanted to inquire if she has pneumonia or not, contacted [facility] spoke with Thersia attempted to get a clinical staff member, currently with patients. Advised patient a follow up call would be made regarding results. Patient was thankful and verbalized understanding.  Called the pt and explained that x-ray has not been read yet, once it does we will call with results after Dr. Theophilus reviews. Nfn

## 2024-05-23 ENCOUNTER — Other Ambulatory Visit: Payer: Self-pay | Admitting: Pulmonary Disease

## 2024-05-23 ENCOUNTER — Other Ambulatory Visit: Payer: Self-pay

## 2024-05-23 ENCOUNTER — Ambulatory Visit: Payer: Self-pay

## 2024-05-23 ENCOUNTER — Telehealth: Admitting: Family

## 2024-05-23 DIAGNOSIS — U071 COVID-19: Secondary | ICD-10-CM | POA: Diagnosis not present

## 2024-05-23 MED ORDER — MOLNUPIRAVIR EUA 200MG CAPSULE: 4.0000 | ORAL_CAPSULE | Freq: Two times a day (BID) | ORAL | 0 refills | Status: AC

## 2024-05-23 MED ORDER — PAXLOVID (300/100) 20 X 150 MG & 10 X 100MG PO TBPK: 3.0000 | ORAL_TABLET | Freq: Two times a day (BID) | ORAL | 0 refills | Status: DC

## 2024-05-23 NOTE — Telephone Encounter (Signed)
**Note De-identified  [place] Obfuscation** Please advise 

## 2024-05-23 NOTE — Progress Notes (Signed)
 Virtual Visit Consent   Kathleen Hammond, you are scheduled for a virtual visit with a [facility] provider today. Just as with appointments in the office, your consent must be obtained to participate. Your consent will be active for this visit and any virtual visit you may have with one of our providers in the next 365 days. If you have a MyChart account, a copy of this consent can be sent to you electronically.  As this is a virtual visit, video technology does not allow for your provider to perform a traditional examination. This may limit your provider's ability to fully assess your condition. If your provider identifies any concerns that need to be evaluated in person or the need to arrange testing (such as labs, EKG, etc.), we will make arrangements to do so. Although advances in technology are sophisticated, we cannot ensure that it will always work on either your end or our end. If the connection with a video visit is poor, the visit may have to be switched to a telephone visit. With either a video or telephone visit, we are not always able to ensure that we have a secure connection.  By engaging in this virtual visit, you consent to the provision of healthcare and authorize for your insurance to be billed (if applicable) for the services provided during this visit. Depending on your insurance coverage, you may receive a charge related to this service.  I need to obtain your verbal consent now. Are you willing to proceed with your visit today? Kathleen Hammond has provided verbal consent on 05/23/2024 for a virtual visit (video or telephone). Bari Learn, FNP  Date: 05/23/2024 5:38 PM   Virtual Visit via Video Note   I, Bari Learn, connected with  Kathleen Hammond  ([id number], 03-29-1956) on 05/23/24 at  5:45 PM EDT by a video-enabled telemedicine application and verified that I am speaking with the correct person using two identifiers.  Location: Patient: Virtual Visit Location Patient:  Home Provider: Virtual Visit Location Provider: Home Office   I discussed the limitations of evaluation and management by telemedicine and the availability of in person appointments. The patient expressed understanding and agreed to proceed.    History of Present Illness: Kathleen Hammond is a 68 y.o. who identifies as a female who was assigned female at birth, and is being seen today for COVID. Her symptoms started 05/12/24 and had a negative COVID test. She has alpha-a-antitrypsin deficiency and followed by Pulmonologist.  However, she tested this AM and was COVID positive. She was given a steroid dose pack and her breathing has improved. Her pulmonologist sent in Paxlovid today, but  was told  insurance has not approved it.    HPI: URI  This is a recurrent problem. The current episode started 1 to 4 weeks ago. The problem has been waxing and waning. Maximum temperature: last week she had 100 F. Associated symptoms include congestion, coughing (tight), a sore throat and wheezing. Pertinent negatives include no nausea or vomiting. She has tried acetaminophen  (prednisone ) for the symptoms. The treatment provided mild relief.    Problems:  Patient Active Problem List   Diagnosis Date Noted   Asthma 11/03/2022   Allergic rhinitis 11/03/2022   Alpha-1-antitrypsin deficiency carrier 11/03/2022   Posterior vitreous detachment of right eye 03/28/2020   Intermediate stage nonexudative age-related macular degeneration of both eyes 03/28/2020   Retinal detachment of left eye with retinal break 03/05/2020   Osteoporosis 08/28/2019   Mixed hyperlipidemia 03/16/2011  Sinus tachycardia 03/16/2011    Allergies:  Allergies  Allergen Reactions   Cefuroxime Axetil Shortness Of Breath   Iodine Shortness Of Breath   Alendronate Sodium Swelling   Augmentin  [Amoxicillin -Pot Clavulanate] Other (See Comments)    Pt c/o tingling in lips   Avelox [Moxifloxacin Hcl In Nacl] Other (See Comments)    Hallucinations     Macrodantin [Nitrofurantoin Macrocrystal] Nausea Only    Chills, aching   Sulfamethoxazole-Trimethoprim Other (See Comments)   Bactrim Rash    Septra   Ceclor [Cefaclor] Rash   Medications:  Current Outpatient Medications:    molnupiravir EUA (LAGEVRIO) 200 mg CAPS capsule, Take 4 capsules (800 mg total) by mouth 2 (two) times daily for 5 days., Disp: 40 capsule, Rfl: 0   nirmatrelvir/ritonavir (PAXLOVID, 300/100,) 20 x 150 MG & 10 x 100MG  TBPK, Take 3 tablets by mouth 2 (two) times daily. 300 mg nirmatrelvir (two 150 mg tablets) and 100 mg ritonavir (one 100 mg tablet) with all 3 tablets taken together by mouth twice daily for 5 days., Disp: 10 tablet, Rfl: 0   albuterol  (PROVENTIL ) (2.5 MG/3ML) 0.083% nebulizer solution, Take 3 mLs (2.5 mg total) by nebulization every 6 (six) hours as needed for wheezing or shortness of breath., Disp: 75 mL, Rfl: 2   albuterol  (VENTOLIN  HFA) 108 (90 Base) MCG/ACT inhaler, Inhale 1-2 puffs into the lungs every 6 (six) hours as needed for wheezing or shortness of breath., Disp: 8 g, Rfl: 6   azithromycin  (ZITHROMAX ) 250 MG tablet, Take 2 tablets day one, and one tablet daily for 4 days until gone. (Patient not taking: Reported on 05/19/2024), Disp: 6 tablet, Rfl: 0   budesonide -formoterol  (SYMBICORT ) 160-4.5 MCG/ACT inhaler, Inhale 2 puffs into the lungs 2 (two) times daily., Disp: 30.6 each, Rfl: 3   PANTOPRAZOLE SODIUM PO, Take 40 mg by mouth daily., Disp: , Rfl:    predniSONE  (DELTASONE ) 10 MG tablet, Take 6 tablets (60 mg total) by mouth daily with breakfast. Take 60 mg/day on day 1 and reduce dose by 10 mg every 2 days until taper is complete, Disp: 50 tablet, Rfl: 0   rosuvastatin (CRESTOR) 5 MG tablet, 10 mg., Disp: , Rfl:    SINGULAIR 10 MG tablet, Take 1 tablet by mouth at bedtime. , Disp: , Rfl:   Observations/Objective: Patient is well-developed, well-nourished in no acute distress.  Resting comfortably  at home.  Head is normocephalic,  atraumatic.  No labored breathing.  Speech is clear and coherent with logical content.  Patient is alert and oriented at baseline.  Tight nonproductive cough, slight nasal congestion  Assessment and Plan: 1. COVID-19 (Primary) - molnupiravir EUA (LAGEVRIO) 200 mg CAPS capsule; Take 4 capsules (800 mg total) by mouth 2 (two) times daily for 5 days.  Dispense: 40 capsule; Refill: 0  Keep follow up with Pulmonologist. COVID positive, rest, force fluids, tylenol  as needed, report any worsening symptoms such as increased shortness of breath, swelling, or continued high fevers. Possible adverse effects discussed with antivirals. Will send in molnupiravir since insurance will not cover Paxlovid. Continue prednisone , Symbicort  and albuterol .    Follow Up Instructions: I discussed the assessment and treatment plan with the patient. The patient was provided an opportunity to ask questions and all were answered. The patient agreed with the plan and demonstrated an understanding of the instructions.  A copy of instructions were sent to the patient via MyChart unless otherwise noted below.     The patient was advised to call back  or seek an in-person evaluation if the symptoms worsen or if the condition fails to improve as anticipated.    Bari Learn, FNP

## 2024-05-23 NOTE — Telephone Encounter (Signed)
 FYI Only or Action Required?: Action required by provider: Please see notes - patient tested Positive for Covid, also is inquiring if she needs Abx or Paxlovid based off CXR from Friday.  Patient is followed in Pulmonology for moderate persistent asthma, last seen on 05/19/2024 by Mannam, Praveen, MD.  Called Nurse Triage reporting Covid Positive.  Symptoms began several days ago.  Interventions attempted: Prescription medications: Z Pack, Prednisone , Rescue inhaler, Maintenance inhaler, Nebulizer treatments, and Increased fluids/rest.  Symptoms are: gradually worsening.  Triage Disposition: Call PCP Within 24 Hours  Patient/caregiver understands and will follow disposition?: Yes   Copied from CRM [id number]. Topic: Clinical - Red Word Triage >> May 23, 2024  8:05 AM Kathleen Hammond wrote: Red Word that prompted transfer to Nurse Triage: Shortness of breath, chest tightness, and congestion. Positive covid test. Reason for Disposition  [1] HIGH RISK patient (e.g., weak immune system, 65 years and older, obesity with BMI 30 or higher, pregnant, chronic lung disease) AND [2] COVID symptoms (e.g., cough, fever)  (Exceptions: Already seen by PCP and no new or worsening symptoms.)  Answer Assessment - Initial Assessment Questions Patient has been on a large dose of Prednisone  (took 60 on Saturday, Sunday, took 50 yesterday, scheduled to take 50 today) Patient recently completed Z Pack on previous visit from 9/19  *Patient states her visit on Friday 9/26 she had a Chest X Ray and was told if it is pneumonia she was going to be put on a strong antibiotic but hasn't heard results yet  she has been using Symbicort , Albuterol , and has been using Albuterol  nebulizer  Patient is inquiring about Paxlovid  1. SYMPTOMS: What is your main symptom or concern? (e.g., cough, fever, shortness of breath, muscle aches)     Positive Covid test, worsening congestion 2. ONSET: When did the symptoms start?       They have been present since 9/19 but when she woke up this morning she started feeling worse with increased congestion 3. COUGH: Do you have a cough? If Yes, ask: How bad is the cough?       Yes a hacking cough 4. FEVER: Do you have a fever? If Yes, ask: What is your temperature, how was it measured, and when did it start?     Doesn't feel like she has had a fever in a few days 5. BREATHING DIFFICULTY: Are you having any difficulty breathing? (e.g., normal; shortness of breath, wheezing, unable to speak)      Patient states if she sits on couch, she is okay. If she tries to get up and do activities around the house she feels weak and sweaty (unsure if that is from Prednisone ) 6. BETTER-SAME-WORSE: Are you getting better, staying the same or getting worse compared to yesterday?  If getting worse, ask, In what way?     Worse 7. OTHER SYMPTOMS: Do you have any other symptoms?  (e.g., chills, fatigue, headache, loss of smell or taste, muscle pain, sore throat)     Hacking cough, voice is scratchy, scratchy throat, congestion, chest tightness, shortness of breath, mild weakness 8. COVID-19 DIAGNOSIS: How do you know that you have COVID? (e.g., positive lab test or self-test, diagnosed by doctor or NP/PA, symptoms after exposure).     Patient had positive covid test this morning for home test 9. COVID-19 EXPOSURE: Was there any known exposure to COVID before the symptoms began?      Patient denies known exposure but states she has been out in the  community 10. COVID-19 VACCINE: Have you had the COVID-19 vaccine? If Yes, ask: When did you last get it?       No 11. HIGH RISK DISEASE: Do you have any chronic medical problems? (e.g., asthma, heart or lung disease, weak immune system, obesity, etc.)       Asthma 13. O2 SATURATION MONITOR:  Do you use an oxygen saturation monitor (pulse oximeter) at home? If Yes, ask What is your reading (oxygen level) today? What is your  usual oxygen saturation reading? (e.g., 95%)       Denies wearing home oxygen, patient has a pulse ox and states it is hovering 96-98% but she has been using Symbicort , Albuterol , and has been using Albuterol  nebulizer  Protocols used: COVID-19 - Diagnosed or Suspected-A-AH

## 2024-05-23 NOTE — Telephone Encounter (Signed)
 FYI Only or Action Required?: Action required by provider: clinical question for provider.  Patient was last seen by Pulmonology on 9/26 with Dr. Theophilus Fortis Nurse Triage reporting New Med Request.  Triage Disposition: Call PCP Within 24 Hours  Patient/caregiver understands and will follow disposition?: Yes  **See note below**      Reason for Disposition  [1] Caller requests to speak ONLY to PCP AND [2] NON-URGENT question  Answer Assessment - Initial Assessment Questions 1. REASON FOR CALL or QUESTION: What is your reason for calling today? or How can I best   Patient stated she is awaiting a call back from the office regarding her request for medication. She spoke with Triage this morning, but has not heard back from the office/provider. See previous encounter.  Protocols used: PCP Call - No Triage-A-AH

## 2024-05-23 NOTE — Telephone Encounter (Signed)
 Chest x-ray has not been read yet.  Can you please call [facility] radiology back to have it read ASAP By my review there is no evidence of pneumonia so we will avoid antibiotics I have sent in a prescription for Paxlovid to her pharmacy

## 2024-05-24 ENCOUNTER — Telehealth: Payer: Self-pay

## 2024-05-24 ENCOUNTER — Encounter: Payer: Self-pay | Admitting: Pulmonary Disease

## 2024-05-24 NOTE — Telephone Encounter (Signed)
 Copied from CRM [id number]. Topic: Clinical - Lab/Test Results >> May 23, 2024  4:41 PM Devaughn RAMAN wrote: Reason for CRM: Patient called in regarding Chest X-ray results, advised patient Dr.Mannam awaiting results from [facility] radiology. Advised patient of Dr.Mannam message regarding paxlavoid medication being sent to her pharmacy. Patient was thankful and verbalized understanding. >> May 23, 2024  4:57 PM Joesph PARAS wrote: Patient is calling to inquire about why she cannot find a number for the x-ray that was taken 05/19/2024. Patient attempted to reach out to [facility] Radiology who denies having seen her for imaging. Please reach out to patient to inform of results.     Spoke w/ PT explained and apologized for the confusion someone will reach out to her over result and answer to question as well   X-rays are done in office - waiting on provider to review.  -NFN

## 2024-05-24 NOTE — Telephone Encounter (Signed)
 If she tested positive for COVID then she should take the Paxlovid even though chest x-ray looks okay.

## 2024-05-25 NOTE — Telephone Encounter (Signed)
 Called and spoke with patient, all the drug stores near her are out of paxlovid, she was told they will be getting some in soon.  The patient feels like she is past the 4 day window of symptoms to take the Paxlovid and her symptoms are steadily improving.  She has finished the Zithromax  and is tapering down on the prednisone .  She will get the Paxlovid when it is available to have on hand if she should need it.  She does state that she thinks she may have thrush in her throat/cheeks from the Symbicort  inhaler.  She states it is not painful.  Advised I would get a message to Dr. Theophilus and if he recommends medication for thrush we will call her back and let her know.  She verbalized understanding.  Dr. Theophilus, Patient states she had white patches in the back of her throat and on the inside of her cheeks.  She is wondering if she has thrush and does she need something to treat it.  Please advise.  Thank you.

## 2024-05-25 NOTE — Telephone Encounter (Signed)
 Please advise on recommendations? I have verified rx was transferred to the [facility] in [place] from the [facility] per pharmacy

## 2024-05-25 NOTE — Telephone Encounter (Signed)
 The cxr has been read  Please see other phone note 05/24/24

## 2024-05-26 ENCOUNTER — Encounter: Payer: Self-pay | Admitting: Pulmonary Disease

## 2024-05-26 MED ORDER — NYSTATIN 100000 UNIT/ML MT SUSP: 5.0000 mL | Freq: Three times a day (TID) | OROMUCOSAL | 0 refills | Status: DC

## 2024-05-26 NOTE — Telephone Encounter (Addendum)
 I called the patient several times but it went to voicemail.  Left a message for her If she is getting better and her symptoms started over 10 days ago then we can avoid taking the Paxlovid as the benefits at this point are minimal  For the thrush I have sent in an order for nystatin mouthwash to her pharmacy

## 2024-05-28 ENCOUNTER — Encounter: Payer: Self-pay | Admitting: Pulmonary Disease

## 2024-05-29 ENCOUNTER — Encounter: Payer: Self-pay | Admitting: Pulmonary Disease

## 2024-05-29 ENCOUNTER — Ambulatory Visit: Admitting: Pulmonary Disease

## 2024-05-29 VITALS — BP 122/80 | HR 80 | Temp 98.3°F | Ht 66.0 in | Wt 177.0 lb

## 2024-05-29 DIAGNOSIS — J4541 Moderate persistent asthma with (acute) exacerbation: Secondary | ICD-10-CM | POA: Diagnosis not present

## 2024-05-29 DIAGNOSIS — B370 Candidal stomatitis: Secondary | ICD-10-CM

## 2024-05-29 DIAGNOSIS — U071 COVID-19: Secondary | ICD-10-CM

## 2024-05-29 MED ORDER — NYSTATIN 100000 UNIT/ML MT SUSP: 5.0000 mL | Freq: Three times a day (TID) | OROMUCOSAL | 0 refills | Status: DC

## 2024-05-29 MED ORDER — PREDNISONE 10 MG PO TABS
40.0000 mg | ORAL_TABLET | Freq: Every day | ORAL | 0 refills | Status: DC
Start: 2024-05-29 — End: 2024-06-03

## 2024-05-29 NOTE — Patient Instructions (Signed)
  VISIT SUMMARY: You visited today due to respiratory symptoms following a recent COVID-19 infection. Your asthma has worsened, and you are experiencing chest tightness and heaviness. You also have oral thrush and are managing your alpha-1 antitrypsin deficiency and osteoporosis.  YOUR PLAN: ASTHMA WITH RECENT EXACERBATION: Your asthma symptoms have worsened, especially after reducing your prednisone  dose. You are experiencing fullness, tightness, and heaviness in your chest. -Increase prednisone  to 40 mg for 5 days, then resume tapering. -Continue using your inhalers and nebulizer as needed. -Rest, stay hydrated, and eat healthy.  COVID-19 INFECTION: You tested positive for COVID-19 last week, which is likely making your asthma symptoms worse. -Monitor your symptoms and continue supportive care.  ALPHA-1 ANTITRYPSIN DEFICIENCY: This condition is contributing to your respiratory symptoms. -No new specific treatment changes at this time.  ORAL CANDIDIASIS (THRUSH): You have white patches in your mouth consistent with oral thrush. -A prescription for Magic Mouthwash has been sent to [facility].  OSTEOPOROSIS: You are currently on Prolia  and have previously used Evenity  to improve bone mass. There is concern about prednisone  affecting your bone health. -Continue monitoring your bone health and continue taking Prolia .            Contains text generated by Abridge.                                 Contains text generated by Abridge.

## 2024-05-29 NOTE — Progress Notes (Signed)
 Kathleen Hammond    [id number]    [birth year]  Primary Care Physician:Hall, Norleen PEDLAR, MD  Referring Physician: Shona Norleen PEDLAR, MD [address] [place],  [place] [address]   Chief complaint:  Follow-up for asthma, alpha-1 antitrypsin deficiency Acute visit for asthma exacerbation  HPI: 68 y.o. 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  Diagnosed over 10 years ago.    Has been asymptomatic with no inhaler therapy.  She was also evaluated at [facility] and advised that she did not need replacement therapy.  Has history of asthma which appears to be mild.  Previously on Symbicort  but currently on albuterol  with good control of symptoms  In November 2021 she required prednisone  for 5 days after she developed a flareup of asthma after Moderna booster  Interim history: Discussed the use of AI scribe software for clinical note transcription with the patient, who gave verbal consent to proceed History of Present Illness Kathleen Hammond is a 68 year old female with asthma and alpha-1 antitrypsin deficiency who presents with respiratory symptoms following a recent COVID-19 infection.  She has received multiple rounds of antibiotics and prednisone  recently for asthma exacerbation  Respiratory symptoms - Fullness, tightness, and heaviness in the lower lungs, worsening after prednisone  dose reduction from 40 mg to 30 mg - Symptoms now involve both upper and lower chest areas - Chest tenderness, especially on the left side - No fever, increased cough, or mucus production  Recent covid-19 infection - Tested positive for COVID-19 last week - Did not take Paxlovid due to delayed acquisition as it was not available in the pharmacy and perceived improvement in symptoms  Medication use and tapering - Currently tapering prednisone , reduced to 20 mg today - Using Mucinex Fastmax, two inhalers, and a nebulizer as needed  Oral candidiasis - Oral thrush present - Nystatin  prescribed but not yet obtained   Relevant History Pets: No pets Occupation: Retired school principal Exposures: No known exposures.  No mold, hot tub, Jacuzzi Smoking history: Smoker Travel history: Significant travel history Relevant family history: No significant family history of lung disease.  Outpatient Encounter Medications as of 05/29/2024  Medication Sig   albuterol  (PROVENTIL ) (2.5 MG/3ML) 0.083% nebulizer solution Take 3 mLs (2.5 mg total) by nebulization every 6 (six) hours as needed for wheezing or shortness of breath.   albuterol  (VENTOLIN  HFA) 108 (90 Base) MCG/ACT inhaler Inhale 1-2 puffs into the lungs every 6 (six) hours as needed for wheezing or shortness of breath.   budesonide -formoterol  (SYMBICORT ) 160-4.5 MCG/ACT inhaler Inhale 2 puffs into the lungs 2 (two) times daily.   magic mouthwash (nystatin, lidocaine, diphenhydrAMINE, alum & mag hydroxide) suspension Swish and swallow 5 mLs 3 (three) times daily for 12 days.   PANTOPRAZOLE SODIUM PO Take 40 mg by mouth daily.   predniSONE  (DELTASONE ) 10 MG tablet Take 6 tablets (60 mg total) by mouth daily with breakfast. Take 60 mg/day on day 1 and reduce dose by 10 mg every 2 days until taper is complete   rosuvastatin (CRESTOR) 5 MG tablet 10 mg.   SINGULAIR 10 MG tablet Take 1 tablet by mouth at bedtime.    azithromycin  (ZITHROMAX ) 250 MG tablet Take 2 tablets day one, and one tablet daily for 4 days until gone. (Patient not taking: Reported on 05/19/2024)   nirmatrelvir/ritonavir (PAXLOVID, 300/100,) 20 x 150 MG & 10 x 100MG  TBPK Take 3 tablets by mouth 2 (two) times daily.  300 mg nirmatrelvir (two 150 mg tablets) and 100 mg ritonavir (one 100 mg tablet) with all 3 tablets taken together by mouth twice daily for 5 days. (Patient not taking: Reported on 05/29/2024)   No facility-administered encounter medications on file as of 05/29/2024.    Vitals:   05/29/24 1001  BP: 122/80  Pulse: 80  Temp: 98.3 F (36.8 C)  Height:  5' 6 (1.676 m)  Weight: 177 lb (80.3 kg)  SpO2: 96%  TempSrc: Oral  BMI (Calculated): 28.58     Physical Exam GEN: No acute distress. CV: Regular rate and rhythm, no murmurs. LUNGS: Wheezing and tightness on the left side. SKIN JOINTS: Warm and dry, no rash. HEENT: White patches in throat and cheek, consistent with thrush.    Data Reviewed: Imaging: Chest x-ray 04/06/2012-no acute cardiopulmonary abnormality CT abdomen pelvis 05/22/13-visualized lung bases are normal. Chest x-ray 06/14/2019-no acute cardiopulmonary abnormality Chest x-ray 05/19/2024-no acute cardiopulmonary abnormality I have reviewed the images personally.  PFTs: 08/03/2018 FVC 3.29 [95%], FEV1 2.45 [92%], F/F 75, TLC 5.16 [96%], DLCO 14.58 [54%) No obstruction or restriction.  Moderate diffusion impairment  10/15/2020 FVC 3.11 [92%], FEV1 2.40 [93%], F/F 77, TLC 4.75 [88%], DLCO 17.71 [4%] Normal test  05/04/2023 FVC 2.63 [80%], FEV1 2.08 [83%], F/F79, TLC 4.40 [82%], DLCO 14.38 [68%] Mild diffusion defect  ACT score 05/04/2023- 22  Labs: Alpha-1 antitrypsin 06/14/2019-72, PI MZ Hepatic panel 06/14/2019-within normal limits  CBC 06/13/2020- WBC 4.6, eos 8.8%, absolute eosinophil count 405 CBC 10/15/2020-WBC 11.6, eos 0.6%, absolute eosinophil count 70 Assessment & Plan Asthma with recent exacerbation Asthma exacerbation with symptoms of fullness, tightness, and heaviness in the lower lungs, worsened after tapering prednisone  from 40 mg. No fever or increased cough. Wheezing and tightness, especially on the left side. No evidence of pneumonia on recent x-ray. COVID-19 infection likely exacerbating asthma symptoms. - Prescribe 40 mg prednisone  for 5 days, then resume tapering. - Continue using inhalers and nebulizer as needed. - Encourage rest, hydration, and healthy eating.  COVID-19 infection Recent COVID-19 infection, tested positive last week. Symptoms include slow improvement in respiratory  function. Did not take Paxlovid due to delayed availability and potential side effects, including bitterness and altered taste. COVID-19 likely exacerbating asthma symptoms. - Monitor symptoms and continue supportive care.  Oral candidiasis (thrush) Presence of white patches in throat and cheek consistent with oral thrush. Nystatin prescription was not filled due to pharmacy issues. - Send prescription for Nystatin Magic Mouthwash to [facility].  Osteoporosis Currently on Prolia . Previous treatment with Evenity  improved bone mass. Concern about prednisone  use affecting bone health. - Monitor bone health, continue Prolia .  Alpha-1 antitrypsin carrier status PFT reviewed with no obstruction Repeat alpha-1 antitrypsin levels and LFTs at return visit  Plan/Recommendations: - Prednisone  40 mg a day for 5 days and then resume prednisone  taper - Continue Symbicort , Singulair - Nystatin Magic mouthwash for thrush  I personally spent a total of 40 minutes in the care of the patient today including preparing to see the patient, getting/reviewing separately obtained history, performing a medically appropriate exam/evaluation, counseling and educating, placing orders, and communicating results.    Geanie Pacifico MD [facility] 06/25/2020, 8:47 AM

## 2024-05-29 NOTE — Telephone Encounter (Signed)
 Patient seen today

## 2024-06-01 ENCOUNTER — Ambulatory Visit: Payer: Self-pay | Admitting: Internal Medicine

## 2024-06-01 ENCOUNTER — Encounter

## 2024-06-01 DIAGNOSIS — Z1231 Encounter for screening mammogram for malignant neoplasm of breast: Secondary | ICD-10-CM

## 2024-06-01 NOTE — Telephone Encounter (Signed)
 FYI Only or Action Required?: Action required by provider: antibiotic request.  Patient is followed in Pulmonology for ashtma, last seen on 05/29/2024 by Mannam, Praveen, MD.  Called Nurse Triage reporting Covid Positive.  Symptoms began several weeks ago.  Interventions attempted: predniSONE  (DELTASONE ) 10 MG tablet, azithromycin  (ZITHROMAX ) 250 MG table .  Symptoms are: unchanged/ maybe a little better  Triage Disposition: Call PCP When Office is Open  Patient/caregiver understands and will follow disposition?: Yes            E2C2 Pulmonary Triage - Initial Assessment Questions Chief Complaint:     Intermittent runny nose, feels like pt has fever even though she does not, leg aches, intermittent chest tightness  "How long have symptoms been present?"    3 weeks  Have you tested for COVID or Flu? Note: If not, ask patient if a home test can be taken. If so, instruct patient to call back for positive results. Yes  OXYGEN: "Do you wear supplemental oxygen?" No  "Do you monitor your oxygen levels?" Yes If yes, What is your reading (oxygen level) today?   97%-98%      Copied from CRM #[id number]. Topic: Clinical - Red Word Triage >> Jun 01, 2024 11:33 AM Russell PARAS wrote: Red Word that prompted transfer to Nurse Triage:   Tested + for COVID last week Feels like she has a high grade fever, but no fever Having asthma symptoms Chest tightness Legs aching even at rest Eyes feel heavy and have burning sensation Continues to feel bad, fatigue Feels similar to when she had bronchitis  Would she be able to try Zpak or another antibiotic?  3rd round of prednisone , will finish tomm  Pt of Dr. Theophilus Reason for Disposition  [1] Caller requesting NON-URGENT health information AND [2] PCP's office is the best resource  Answer Assessment - Initial Assessment Questions Pt is wondering if she needs another antibiotic prescribed as she feels so bad. Pt was seen in office  on 10/6 by Dr. Theophilus and was prescribed her third round of prednisone . Pt's PCP had given her an antibiotic when the symptoms started 3 weeks ago Pt would like to try an antibiotic (pt notes she has a lot of allergies to antibiotics) sent to the following pharmacy. Pt did not want to schedule another appointment at this time but states if she still does not feel better on Mon she will schedule at that time. Pt call back number is [phone].  [facility] - [place], [place] - [address] [place], [place] [address] Phone: [phone]  Fax: [phone]   Pt tested positive for COVID a week ago Symptoms started 3 weeks ago; breathing is better, chest tightness is better but still having symptoms Denies fever (feels like she has one, legs aching)  SEVERITY: How bad is your breathing? (e.g., mild, moderate, severe)      Not really SOB per pt  OTHER SYMPTOMS: Do you have any other symptoms? (e.g., chest pain, cough, dizziness, fever, runny nose)     Intermittent runny nose, intermittent chest tightness (ongoing issue); denies dizziness, cough; denies wheezing  O2 SATURATION MONITOR:  Do you use an oxygen saturation monitor (pulse oximeter) at home? If Yes, ask: What is your reading (oxygen level) today? What is your usual oxygen saturation reading? (e.g., 95%)       97%-98%; does not use supplemental oxygen  Protocols used: Breathing Difficulty-A-AH, Information Only Call - No Triage-A-AH

## 2024-06-01 NOTE — Telephone Encounter (Signed)
 Patient states she might be a little better. She is requesting an antibiotic.  Please advise.  Thank you.

## 2024-06-03 ENCOUNTER — Telehealth: Admitting: Family Medicine

## 2024-06-03 ENCOUNTER — Other Ambulatory Visit: Payer: Self-pay | Admitting: Pulmonary Disease

## 2024-06-03 DIAGNOSIS — J069 Acute upper respiratory infection, unspecified: Secondary | ICD-10-CM | POA: Diagnosis not present

## 2024-06-03 MED ORDER — PREDNISONE 10 MG PO TABS: 40.0000 mg | ORAL_TABLET | Freq: Every day | ORAL | 0 refills | Status: DC

## 2024-06-03 MED ORDER — AZITHROMYCIN 250 MG PO TABS
ORAL_TABLET | ORAL | 0 refills | Status: AC
Start: 2024-06-03 — End: 2024-06-08

## 2024-06-03 NOTE — Progress Notes (Signed)
 Virtual Visit Consent   Kathleen Hammond, you are scheduled for a virtual visit with a [facility] provider today. Just as with appointments in the office, your consent must be obtained to participate. Your consent will be active for this visit and any virtual visit you may have with one of our providers in the next 365 days. If you have a MyChart account, a copy of this consent can be sent to you electronically.  As this is a virtual visit, video technology does not allow for your provider to perform a traditional examination. This may limit your provider's ability to fully assess your condition. If your provider identifies any concerns that need to be evaluated in person or the need to arrange testing (such as labs, EKG, etc.), we will make arrangements to do so. Although advances in technology are sophisticated, we cannot ensure that it will always work on either your end or our end. If the connection with a video visit is poor, the visit may have to be switched to a telephone visit. With either a video or telephone visit, we are not always able to ensure that we have a secure connection.  By engaging in this virtual visit, you consent to the provision of healthcare and authorize for your insurance to be billed (if applicable) for the services provided during this visit. Depending on your insurance coverage, you may receive a charge related to this service.  I need to obtain your verbal consent now. Are you willing to proceed with your visit today? Noel Henandez Ruud has provided verbal consent on 06/03/2024 for a virtual visit (video or telephone). Loa Lamp, FNP  Date: 06/03/2024 8:58 AM   Virtual Visit via Video Note   I, Loa Lamp, connected with  BRIGHTEN BUZZELLI  ([id number], Sep 24, 1955) on 06/03/24 at  9:00 AM EDT by a video-enabled telemedicine application and verified that I am speaking with the correct person using two identifiers.  Location: Patient: Virtual Visit Location Patient: Home Provider:  Virtual Visit Location Provider: Home Office   I discussed the limitations of evaluation and management by telemedicine and the availability of in person appointments. The patient expressed understanding and agreed to proceed.    History of Present Illness: Kathleen Hammond is a 68 y.o. who identifies as a female who was assigned female at birth, and is being seen today for one week ago saw pulmonary, covid neg at home, flu neg in her office. Started z pack, sx worsened then on 9/26 saw pulmonary again, cxr not read for 6 days but was neg and they did a second round of prednisone . No atb repeat. Has inhalers, nebs, mucinex etc. . The following Tuesday worsened and covid test was positive. Continued prednisone . Saw pulmonary again on 10/6. Heavy in lungs. Tight and congested. Continues to be fatigued.   HPI: HPI  Problems:  Patient Active Problem List   Diagnosis Date Noted   Asthma 11/03/2022   Allergic rhinitis 11/03/2022   Alpha-1-antitrypsin deficiency carrier 11/03/2022   Posterior vitreous detachment of right eye 03/28/2020   Intermediate stage nonexudative age-related macular degeneration of both eyes 03/28/2020   Retinal detachment of left eye with retinal break 03/05/2020   Osteoporosis 08/28/2019   Mixed hyperlipidemia 03/16/2011   Sinus tachycardia 03/16/2011    Allergies:  Allergies  Allergen Reactions   Cefuroxime Axetil Shortness Of Breath   Iodine Shortness Of Breath   Alendronate Sodium Swelling   Augmentin  [Amoxicillin -Pot Clavulanate] Other (See Comments)    Pt  c/o tingling in lips   Avelox [Moxifloxacin Hcl In Nacl] Other (See Comments)    Hallucinations    Macrodantin [Nitrofurantoin Macrocrystal] Nausea Only    Chills, aching   Sulfamethoxazole-Trimethoprim Other (See Comments)   Bactrim Rash    Septra   Ceclor [Cefaclor] Rash   Medications:  Current Outpatient Medications:    albuterol  (PROVENTIL ) (2.5 MG/3ML) 0.083% nebulizer solution, Take 3 mLs (2.5 mg  total) by nebulization every 6 (six) hours as needed for wheezing or shortness of breath., Disp: 75 mL, Rfl: 2   albuterol  (VENTOLIN  HFA) 108 (90 Base) MCG/ACT inhaler, Inhale 1-2 puffs into the lungs every 6 (six) hours as needed for wheezing or shortness of breath., Disp: 8 g, Rfl: 6   azithromycin  (ZITHROMAX ) 250 MG tablet, Take 2 tablets day one, and one tablet daily for 4 days until gone. (Patient not taking: Reported on 05/19/2024), Disp: 6 tablet, Rfl: 0   budesonide -formoterol  (SYMBICORT ) 160-4.5 MCG/ACT inhaler, Inhale 2 puffs into the lungs 2 (two) times daily., Disp: 30.6 each, Rfl: 3   magic mouthwash (nystatin, lidocaine, diphenhydrAMINE, alum & mag hydroxide) suspension, Swish and swallow 5 mLs 3 (three) times daily for 12 days., Disp: 180 mL, Rfl: 0   nirmatrelvir/ritonavir (PAXLOVID, 300/100,) 20 x 150 MG & 10 x 100MG  TBPK, Take 3 tablets by mouth 2 (two) times daily. 300 mg nirmatrelvir (two 150 mg tablets) and 100 mg ritonavir (one 100 mg tablet) with all 3 tablets taken together by mouth twice daily for 5 days. (Patient not taking: Reported on 05/29/2024), Disp: 10 tablet, Rfl: 0   PANTOPRAZOLE SODIUM PO, Take 40 mg by mouth daily., Disp: , Rfl:    predniSONE  (DELTASONE ) 10 MG tablet, Take 6 tablets (60 mg total) by mouth daily with breakfast. Take 60 mg/day on day 1 and reduce dose by 10 mg every 2 days until taper is complete, Disp: 50 tablet, Rfl: 0   predniSONE  (DELTASONE ) 10 MG tablet, Take 4 tablets (40 mg total) by mouth daily with breakfast for 5 days., Disp: 20 tablet, Rfl: 0   rosuvastatin (CRESTOR) 5 MG tablet, 10 mg., Disp: , Rfl:    SINGULAIR 10 MG tablet, Take 1 tablet by mouth at bedtime. , Disp: , Rfl:   Observations/Objective: Patient is well-developed, well-nourished in no acute distress.  Resting comfortably  at home.  Head is normocephalic, atraumatic.  No labored breathing.  Speech is clear and coherent with logical content.  Patient is alert and oriented at  baseline.    Assessment and Plan: There are no diagnoses linked to this encounter. Push fluids, humidifier at night, mvi. UC if sx worsen.   Follow Up Instructions: I discussed the assessment and treatment plan with the patient. The patient was provided an opportunity to ask questions and all were answered. The patient agreed with the plan and demonstrated an understanding of the instructions.  A copy of instructions were sent to the patient via MyChart unless otherwise noted below.     The patient was advised to call back or seek an in-person evaluation if the symptoms worsen or if the condition fails to improve as anticipated.    Dorcas Melito, FNP

## 2024-06-03 NOTE — Patient Instructions (Signed)
 COVID-19: What to Know COVID-19 is an infection caused by a virus called SARS-CoV-2. This type of virus is called a coronavirus. People with COVID-19 may: Have few to no symptoms. Have mild to moderate symptoms that affect their lungs and breathing. Get very sick. What are the causes?  COVID-19 is caused by a virus. This virus may be in the air as droplets or on surfaces. It can spread from an infected person when they cough, sneeze, speak, sing, or breathe. You may become infected if: You breathe in the infected droplets in the air. You touch an object that has the virus on it. What increases the risk? You are at risk of getting COVID-19 if you have been around someone with the infection. You may be more likely to get very sick if: You are 57 years old or older. You have certain medical conditions, such as: Heart disease. Diabetes. Long-term respiratory disease. Cancer. Pregnancy. You are immunocompromised. This means your body can't fight infections easily. You have a disability that makes it hard for you to move around, you have trouble moving, or you can't move at all. What are the signs or symptoms? People may have different symptoms from COVID-19. The symptoms can also be mild to very bad. They often show up in 5-6 days after being infected. But, they can take up to 14 days to appear. Common symptoms are: Cough. Feeling tired. New loss of taste or smell. Fever. Less common symptoms are: Sore throat. Headache. Body or muscle aches. Diarrhea. A skin rash or fingers or toes that are a different color than usual. Red or irritated eyes. Sometimes, COVID-19 does not cause symptoms. How is this diagnosed? COVID-19 can be diagnosed with tests done in the lab or at home. Fluid from your nose, mouth, or lungs will be used to check for the virus. How is this treated? Treatment for COVID-19 depends on how sick you are. Mild symptoms can be treated at home with rest, fluids, and  over-the-counter medicines. very bad symptoms may be treated in a hospital intensive care unit (ICU). If you have symptoms and are at risk of getting very sick, you may be given a medicine that fights viruses. This medicine is called an antiviral. How is this prevented? To protect yourself from COVID-19: Know your risk factors. Get vaccinated. If your body can't fight infections easily, talk to your provider about treatment to help prevent COVID-19. Stay at least about 3 feet (1 meter) away from other people. Wear mask that fits well when: You can't stay at a distance from people. You're in a place with not a lot of air flow. Try to be in open spaces with good air flow when you are in public. Wash your hands often or use an alcohol-based hand sanitizer. Cover your nose and mouth when you cough or sneeze. If you think you have COVID-19 or have been around someone who has it, stay home and away from other people as told by your provider or health officials. Where to find more information To learn more: Go to [employer]Prom[employer].no Click Health Topics. Type COVID-19 in the search box. Go to VisitDestination.com.br Click Health Topics. Then click All Topics. Type COVID-19 in the search box. Get help right away if: You have trouble breathing or get short of breath. You have pain or pressure in your chest. You're feeling confused. These symptoms may be an emergency. Get help right away. Call 911. Do not wait to see if the symptoms will go away.  Do not drive yourself to the hospital. This information is not intended to replace advice given to you by your health care provider. Make sure you discuss any questions you have with your health care provider. Document Revised: 05/13/2023 Document Reviewed: 05/05/2023 Elsevier Patient Education  2025 [employer].

## 2024-06-03 NOTE — Telephone Encounter (Signed)
 I called and discussed with patient She had virtual urgent care visit today and was already prescribed azithromycin  Overall she is slightly improved but still symptomatic I will call in additional prednisone  40 mg a day for 5 days.  Nothing further needed

## 2024-06-05 ENCOUNTER — Ambulatory Visit: Admitting: Primary Care

## 2024-06-05 ENCOUNTER — Ambulatory Visit (INDEPENDENT_AMBULATORY_CARE_PROVIDER_SITE_OTHER)

## 2024-06-05 ENCOUNTER — Ambulatory Visit: Payer: Self-pay | Admitting: Primary Care

## 2024-06-05 ENCOUNTER — Encounter: Payer: Self-pay | Admitting: Primary Care

## 2024-06-05 VITALS — BP 104/72 | HR 118 | Temp 98.1°F | Ht 66.0 in | Wt 179.4 lb

## 2024-06-05 DIAGNOSIS — R000 Tachycardia, unspecified: Secondary | ICD-10-CM

## 2024-06-05 DIAGNOSIS — R509 Fever, unspecified: Secondary | ICD-10-CM

## 2024-06-05 DIAGNOSIS — J4541 Moderate persistent asthma with (acute) exacerbation: Secondary | ICD-10-CM

## 2024-06-05 DIAGNOSIS — I2699 Other pulmonary embolism without acute cor pulmonale: Secondary | ICD-10-CM

## 2024-06-05 DIAGNOSIS — R0602 Shortness of breath: Secondary | ICD-10-CM | POA: Diagnosis not present

## 2024-06-05 DIAGNOSIS — J45901 Unspecified asthma with (acute) exacerbation: Secondary | ICD-10-CM | POA: Diagnosis not present

## 2024-06-05 LAB — CBC WITH DIFFERENTIAL/PLATELET
Basophils Absolute: 0.1 K/uL (ref 0.0–0.1)
Basophils Relative: 0.6 % (ref 0.0–3.0)
Eosinophils Absolute: 0.5 K/uL (ref 0.0–0.7)
Eosinophils Relative: 4.2 % (ref 0.0–5.0)
HCT: 43.7 % (ref 36.0–46.0)
Hemoglobin: 14.3 g/dL (ref 12.0–15.0)
Lymphocytes Relative: 19 % (ref 12.0–46.0)
Lymphs Abs: 2.3 K/uL (ref 0.7–4.0)
MCHC: 32.8 g/dL (ref 30.0–36.0)
MCV: 87.9 fl (ref 78.0–100.0)
Monocytes Absolute: 0.8 K/uL (ref 0.1–1.0)
Monocytes Relative: 6.7 % (ref 3.0–12.0)
Neutro Abs: 8.5 K/uL — ABNORMAL HIGH (ref 1.4–7.7)
Neutrophils Relative %: 69.5 % (ref 43.0–77.0)
Platelets: 251 K/uL (ref 150.0–400.0)
RBC: 4.97 Mil/uL (ref 3.87–5.11)
RDW: 14.6 % (ref 11.5–15.5)
WBC: 12.2 K/uL — ABNORMAL HIGH (ref 4.0–10.5)

## 2024-06-05 LAB — COMPREHENSIVE METABOLIC PANEL WITH GFR
ALT: 29 U/L (ref 0–35)
AST: 19 U/L (ref 0–37)
Albumin: 4.3 g/dL (ref 3.5–5.2)
Alkaline Phosphatase: 61 U/L (ref 39–117)
BUN: 13 mg/dL (ref 6–23)
CO2: 30 meq/L (ref 19–32)
Calcium: 9.1 mg/dL (ref 8.4–10.5)
Chloride: 98 meq/L (ref 96–112)
Creatinine, Ser: 0.97 mg/dL (ref 0.40–1.20)
GFR: 59.11 mL/min — ABNORMAL LOW (ref >60.00)
Glucose, Bld: 96 mg/dL (ref 70–99)
Potassium: 4.6 meq/L (ref 3.5–5.1)
Sodium: 136 meq/L (ref 135–145)
Total Bilirubin: 1 mg/dL (ref 0.2–1.2)
Total Protein: 7 g/dL (ref 6.0–8.3)

## 2024-06-05 LAB — POCT EXHALED NITRIC OXIDE: FeNO level (ppb): 28

## 2024-06-05 MED ORDER — PREDNISONE 10 MG PO TABS: ORAL_TABLET | ORAL | 0 refills | Status: AC

## 2024-06-05 MED ORDER — BREZTRI AEROSPHERE 160-9-4.8 MCG/ACT IN AERO: 2.0000 | INHALATION_SPRAY | Freq: Two times a day (BID) | RESPIRATORY_TRACT | Status: DC

## 2024-06-05 MED ORDER — METHYLPREDNISOLONE ACETATE 80 MG/ML IJ SUSP
80.0000 mg | Freq: Once | INTRAMUSCULAR | Status: AC
  Administered 2024-06-05: 80 mg via INTRAMUSCULAR

## 2024-06-05 MED ORDER — NYSTATIN 100000 UNIT/ML MT SUSP: 5.0000 mL | Freq: Three times a day (TID) | OROMUCOSAL | 0 refills | Status: AC

## 2024-06-05 NOTE — Progress Notes (Signed)
 CXR was normal, no acute process

## 2024-06-05 NOTE — Progress Notes (Signed)
 @Patient  ID: Kathleen Hammond, female    DOB: Apr 21, 1956, 68 y.o.   MRN: [id number]  No chief complaint on file.   Referring provider: Shona Norleen PEDLAR, MD  HPI: 68 year old female, never smoked. 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   06/05/2024- Interim hx History of Present Illness Discussed the use of AI scribe software for clinical note transcription with the patient, who gave verbal consent to proceed.  Kathleen Hammond is a 68 year old female with alpha-1 antitrypsin deficiency and asthma who presents with chest tightness following a recent COVID-19 infection. She was seen on 05/29/24 for respiratory symptoms following recent covid infection. She has received multiple round of antibiotics and prednisone  for asthma exacerbation  She presents today for acute visit regarding chest tightness. Patient tested positive for covid two weeks ago, she was not treated with paxlovid due to delayed acquisition. She reports having a fever 100.0 yesterday. She has had several courses of prednisone  and is currently on a zpack.  She is on Symbicort  160mcg and Singulair as a maintenance regimen for asthma.   She has been experiencing chest tightness for about a month, which she attributes to her asthma being exacerbated by the infection. The chest tightness is intermittent and sometimes severe, but she has not felt in respiratory distress. She has been using her inhalers and nebulizer as needed. She completed a five-day course of prednisone  at 40 mg daily, which ended last Friday, but has not yet received a new prescription for another course. She was also prescribed a Z-Pak by a virtual urgent care practitioner who suspected a secondary pneumonia.  She has oral thrush and has been prescribed a 'Magic Mix' for it, though she has not been diligent with its use. She has white patches in her mouth that do not hurt, and her throat is sometimes  scratchy in the morning.  She experienced a fever yesterday and is currently taking a Z-Pak and Symbicort  as needed. She also takes Singulair regularly. Her pulse rate has been high recently, and she has been experiencing shortness of breath intermittently. No leg pain, swelling, or redness. She has a history of rotator cuff surgery on her arm, which sometimes causes muscle tightness with physical activity.  She has a history of acid reflux, for which she takes medication, and reports diarrhea since starting the Z-Pak, which she notes is typical for her. No abdominal pain, blood in stools, or urinary symptoms.  She has never smoked and typically responds well to prednisone , though this time she has not experienced the usual improvement.   PFTs: 08/03/2018 FVC 3.29 [95%], FEV1 2.45 [92%], F/F 75, TLC 5.16 [96%], DLCO 14.58 [54%) No obstruction or restriction.  Moderate diffusion impairment   10/15/2020 FVC 3.11 [92%], FEV1 2.40 [93%], F/F 77, TLC 4.75 [88%], DLCO 17.71 [4%] Normal test   05/04/2023 FVC 2.63 [80%], FEV1 2.08 [83%], F/F79, TLC 4.40 [82%], DLCO 14.38 [68%] Mild diffusion defect  ACT score 05/04/2023- 22   Labs: Alpha-1 antitrypsin 06/14/2019-72, PI MZ Hepatic panel 06/14/2019-within normal limits   CBC 06/13/2020- WBC 4.6, eos 8.8%, absolute eosinophil count 405 CBC 10/15/2020-WBC 11.6, eos 0.6%, absolute eosinophil count 70  Allergies  Allergen Reactions   Cefuroxime Axetil Shortness Of Breath   Iodine Shortness Of Breath   Alendronate Sodium Swelling   Augmentin  [Amoxicillin -Pot Clavulanate] Other (See Comments)    Pt c/o tingling in lips   Avelox [Moxifloxacin Hcl In Nacl] Other (See  Comments)    Hallucinations    Macrodantin [Nitrofurantoin Macrocrystal] Nausea Only    Chills, aching   Sulfamethoxazole-Trimethoprim Other (See Comments)   Bactrim Rash    Septra   Ceclor [Cefaclor] Rash    Immunization History  Administered Date(s) Administered    INFLUENZA, HIGH DOSE SEASONAL PF 09/22/2023   Influenza Split 08/22/2015   Influenza-Unspecified 07/08/2016, 06/17/2017, 07/13/2018, 04/25/2019, 07/04/2019, 08/06/2022   Moderna Sars-Covid-2 Vaccination 08/30/2019, 09/27/2019, 06/20/2020   Td 08/28/2011, 11/19/2015   Unspecified SARS-COV-2 Vaccination 08/30/2019, 09/27/2019, 06/20/2020, 06/11/2021, 06/11/2022    Past Medical History:  Diagnosis Date   Alpha-1-antitrypsin deficiency (HCC)    Asthma    History of anemia    Hyperlipidemia    Irritable bowel    Osteoporosis    Perimenopausal vasomotor symptoms    Postmenopausal    Urinary incontinence     Tobacco History: Social History   Tobacco Use  Smoking Status Never   Passive exposure: Past  Smokeless Tobacco Never   Counseling given: Not Answered   Outpatient Medications Prior to Visit  Medication Sig Dispense Refill   albuterol  (PROVENTIL ) (2.5 MG/3ML) 0.083% nebulizer solution Take 3 mLs (2.5 mg total) by nebulization every 6 (six) hours as needed for wheezing or shortness of breath. 75 mL 2   albuterol  (VENTOLIN  HFA) 108 (90 Base) MCG/ACT inhaler Inhale 1-2 puffs into the lungs every 6 (six) hours as needed for wheezing or shortness of breath. 8 g 6   azithromycin  (ZITHROMAX ) 250 MG tablet Take 2 tablets day one, and one tablet daily for 4 days until gone. (Patient not taking: Reported on 05/19/2024) 6 tablet 0   azithromycin  (ZITHROMAX ) 250 MG tablet Take 2 tablets on day 1, then 1 tablet daily on days 2 through 5 6 tablet 0   budesonide -formoterol  (SYMBICORT ) 160-4.5 MCG/ACT inhaler Inhale 2 puffs into the lungs 2 (two) times daily. 30.6 each 3   magic mouthwash (nystatin, lidocaine, diphenhydrAMINE, alum & mag hydroxide) suspension Swish and swallow 5 mLs 3 (three) times daily for 12 days. 180 mL 0   nirmatrelvir/ritonavir (PAXLOVID, 300/100,) 20 x 150 MG & 10 x 100MG  TBPK Take 3 tablets by mouth 2 (two) times daily. 300 mg nirmatrelvir (two 150 mg tablets) and 100 mg  ritonavir (one 100 mg tablet) with all 3 tablets taken together by mouth twice daily for 5 days. (Patient not taking: Reported on 05/29/2024) 10 tablet 0   PANTOPRAZOLE SODIUM PO Take 40 mg by mouth daily.     predniSONE  (DELTASONE ) 10 MG tablet Take 4 tablets (40 mg total) by mouth daily with breakfast for 5 days. 20 tablet 0   rosuvastatin (CRESTOR) 5 MG tablet 10 mg.     SINGULAIR 10 MG tablet Take 1 tablet by mouth at bedtime.      No facility-administered medications prior to visit.   Review of Systems  Review of Systems  Constitutional:  Positive for fever.  Respiratory:  Positive for chest tightness and shortness of breath.    Physical Exam  There were no vitals taken for this visit. Physical Exam Constitutional:      General: She is not in acute distress.    Appearance: Normal appearance. She is not ill-appearing.  HENT:     Head: Normocephalic and atraumatic.  Cardiovascular:     Rate and Rhythm: Regular rhythm. Tachycardia present.  Pulmonary:     Effort: No respiratory distress.     Breath sounds: No stridor. No rhonchi or rales.     Comments:  Exp wheeze/ pleural rub Musculoskeletal:        General: Normal range of motion.  Skin:    General: Skin is warm and dry.  Neurological:     General: No focal deficit present.     Mental Status: She is alert and oriented to person, place, and time. Mental status is at baseline.  Psychiatric:        Mood and Affect: Mood normal.        Behavior: Behavior normal.        Thought Content: Thought content normal.        Judgment: Judgment normal.     Lab Results:  CBC    Component Value Date/Time   WBC 11.6 (H) 10/15/2020 1119   RBC 4.79 10/15/2020 1119   HGB 13.9 10/15/2020 1119   HCT 41.5 10/15/2020 1119   PLT 270.0 10/15/2020 1119   MCV 86.7 10/15/2020 1119   MCH 30.5 11/05/2012 1352   MCHC 33.5 10/15/2020 1119   RDW 13.5 10/15/2020 1119   LYMPHSABS 2.2 10/15/2020 1119   MONOABS 0.8 10/15/2020 1119   EOSABS 0.1  10/15/2020 1119   BASOSABS 0.1 10/15/2020 1119    BMET    Component Value Date/Time   NA 140 05/01/2024 0856   NA 144 12/10/2023 0809   K 3.8 05/01/2024 0856   CL 105 05/01/2024 0856   CO2 26 05/01/2024 0856   GLUCOSE 106 (H) 05/01/2024 0856   BUN 15 05/01/2024 0856   BUN 15 12/10/2023 0809   CREATININE 0.89 05/01/2024 0856   CALCIUM 9.4 05/01/2024 0856   GFRNONAA >90 11/05/2012 1352   GFRAA >90 11/05/2012 1352    BNP No results found for: BNP  ProBNP No results found for: PROBNP  Imaging: DG Chest 2 View Result Date: 05/24/2024 CLINICAL DATA:  Persistent cough, asthma exacerbation. Evaluate for pneumonia EXAM: CHEST - 2 VIEW COMPARISON:  Chest x-ray 07/27/2023 FINDINGS: The heart and mediastinal contours are within normal limits. No focal consolidation. No pulmonary edema. No pleural effusion. No pneumothorax. No acute osseous abnormality. IMPRESSION: No active cardiopulmonary disease. Electronically Signed   By: Morgane  Naveau M.D.   On: 05/24/2024 23:28     Assessment & Plan:   No problem-specific Assessment & Plan notes found for this encounter.   There are no diagnoses linked to this encounter.  Assessment and Plan Assessment & Plan Moderate persistent asthma with acute exacerbation post-COVID-19 infection Asthma exacerbation post-COVID-19 infection, presenting with intermittent chest tightness and shortness of breath. Previous prednisone  treatment provided partial relief. No evidence of pneumonia on chest x-ray. Breztri considered as an alternative to Symbicort  due to persistent symptoms. Discussed risks of long-term prednisone  use, including drug-induced diabetes, immunosuppression, and osteoporosis. Short-term use deemed low risk. Breztri chosen for its additional bronchodilator effect, used off-label for asthma. - Administer Depo-medrol shot today - Prescribe Breztri Aerosphere for two weeks, two puffs in the morning and evening, in place of Symbicort  - Use  a spacer with Breztri - Prescribe prednisone  taper: 40 mg for 5 days, then taper to 30 mg for 3 days, 20 mg for 3 days, and 10 mg for 3 days - Order FENO to assess lung inflammation - Encourage fluid intake and use acetaminophen  around the clock for the next day or two  FENO 06/05/2024 >> 21  This result suggests low (<25) Type 2 (T2) airway inflammation indicating a low likelihood of active T2-driven airway inflammation; reduced probability of response to inhaled corticosteroids.    Oral candidiasis (thrush)  secondary to inhaled corticosteroid use Oral candidiasis secondary to Symbicort  use, presenting with white patches in the mouth. She has not been diligent with nystatin use due to feeling unwell. - Refill nystatin and instruct to use four times a day - Recommend using a spacer with inhalers to reduce risk of thrush  Fever following recent COVID-19 infection Recent fever following COVID-19 infection. She is currently on a Z-Pak.  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 No signs of blood clot on examination. Previous rotator cuff surgery may contribute to symptoms.  - Order D-dimer test to rule out blood clot - Order basic labs including CBC, CMP, and D-dimer  Tachycardia under evaluation Tachycardia with heart rate of 125 bpm. Reports increased heart rate during sleep. No history of underlying heart failure. Tachycardia may be related to dehydration or other factors. Discussed monitoring and potential causes including dehydration. - Check D-dimer due to recent covid infection increasing risk for blood clot - Encourage fluid intake - Monitor heart rate and symptoms - Follow-up with primary care    Kathleen LELON Ferrari, NP 06/05/2024

## 2024-06-05 NOTE — Patient Instructions (Addendum)
   VISIT SUMMARY: Today, we discussed your recent chest tightness and other symptoms following your COVID-19 infection. We reviewed your asthma management, addressed your oral thrush, and evaluated your recent fever, muscle pain, and increased heart rate.  YOUR PLAN: -ASTHMA EXACERBATION POST-COVID-19 INFECTION: Your asthma has worsened following your COVID-19 infection, causing chest tightness and shortness of breath. We will switch your inhaler to Breztri, which you should use two puffs in the morning and evening with a spacer. You will also start a prednisone  taper to help reduce inflammation.   -ORAL CANDIDIASIS (THRUSH): You have oral thrush, likely due to your inhaler use, which has caused white patches in your mouth. We have refilled your nystatin prescription, and you should use it four times a day. Using a spacer with your inhalers can help reduce the risk of thrush.  -FEVER FOLLOWING RECENT COVID-19 INFECTION: You have had a fever following your COVID-19 infection. Continue taking your Z-Pak as prescribed, tylenol  every 6-8 hours for the next 2-3 days and push fluids   -MUSCLE PAIN POSSIBLY DRUG-INDUCED (PREDNISONE -RELATED MYOPATHY): You are experiencing muscle pain in your arm, which may be related to your prednisone  use or your previous rotator cuff surgery. We will run some tests to rule out other causes, including a blood clot.  -TACHYCARDIA UNDER EVALUATION: Your heart rate has been high, which may be due to dehydration or other factors. Please drink plenty of fluids and monitor your heart rate and symptoms.  INSTRUCTIONS: Please follow up with the phenotest to assess lung inflammation and the basic labs including CBC, CMP, and D-dimer. Continue to monitor your symptoms and heart rate, and ensure you are staying hydrated. Use your medications as prescribed and contact us  if you have any concerns or worsening symptoms.  Orders: Check POC glucose Depo medrol 80mg  IM X1 FENO Labs  (CBC, CMET, D-dimer) Spacer- if we do not have in office, patient can order on [employer]  CXR- done, awaiting official read from radiologist   Follow-up First available with Dr. Theophilus

## 2024-06-06 LAB — D-DIMER, QUANTITATIVE: D-Dimer, Quant: 0.53 ug{FEU}/mL — ABNORMAL HIGH (ref <0.50)

## 2024-06-06 NOTE — Telephone Encounter (Signed)
 Kathleen Hammond, please see recent labs and advise. Thanks

## 2024-06-06 NOTE — Telephone Encounter (Signed)
 Labs from primary care looked fine, nothing critical

## 2024-06-06 NOTE — Telephone Encounter (Signed)
 CTA ordered. I called and spoke to pt regarding her lab work from PCP and cxr from our office. Pt informed and NFN

## 2024-06-06 NOTE — Telephone Encounter (Signed)
 I sent Kathleen Hammond results message

## 2024-06-06 NOTE — Progress Notes (Signed)
 Please let patient know CXR was normal, I believe she already got these results. WBC elevated but this is likely because she has been on steroids for the last month. CMET including kidney and liver function were normal. D-dimer was slightly elevated. Because of her recent covid dx, sob and tachycardia we should get a CTA to ensure she does not have a pulmonary embolism, please order if she is ok with this.

## 2024-06-07 ENCOUNTER — Telehealth: Payer: Self-pay

## 2024-06-07 MED ORDER — PREDNISONE 50 MG PO TABS: ORAL_TABLET | ORAL | 0 refills | Status: DC

## 2024-06-07 MED ORDER — DIPHENHYDRAMINE HCL 50 MG PO TABS: ORAL_TABLET | ORAL | 0 refills | Status: DC

## 2024-06-07 NOTE — Telephone Encounter (Signed)
 This needs to be sent to [facility] team for pre-cert  Also she needs to be pre-medicated for IV contrast allergy. She needs to take prednisone  50mg  by mouth 13 hours, 7 hours and 1 hours before contrast CT scan (she would take this in place of the taper and then resume her previous dose); Plus Diphenhydramine 50mg  1 hour before contrast CT scan

## 2024-06-07 NOTE — Telephone Encounter (Signed)
 I called and spoke with patient, provided instructions for prednisone  and benadryl to be taken starting 13 hours prior to the CT angio.  I advised her that the prescriptions had been sent to [facility] in [place].  I also let her know that I would send the instruction for the medications to her mychart for her to have for her to refer to.  She verbalized understanding.  Nothing further needed.   While reviewing the medications that were sent in to her pharmacy I noticed that the prednisone  that was sent in to [facility] was the prednisone  taper that had been sent in initially.  The prep for the CT angio prednisone  and Benadryl was sent to [facility] pharmacy in [place].  I called the patient back and got her VM, I left a message letting know that the new prednisone  and Benadryl had been sent in to [facility] in [place] and that I would send the information to her mychart message as well.  Advised to call back with any questions.     Information sent to [facility] as well.  Advised to call with any questions.

## 2024-06-07 NOTE — Telephone Encounter (Signed)
 Copied from CRM [id number]. Topic: Clinical - Request for Lab/Test Order >> Jun 07, 2024  9:18 AM Russell PARAS wrote: Reason for CRM:   Andrea, with Pre Cert at [facility], contacting clinic regarding order for CTA placed by [facility]. Pt is needing Pre Cert, due to [facility], to have imaging performed.   CB#  [phone], ext [phone]  please advise.

## 2024-06-08 ENCOUNTER — Telehealth: Payer: Self-pay

## 2024-06-08 MED ORDER — DIPHENHYDRAMINE HCL 50 MG PO TABS: ORAL_TABLET | ORAL | 0 refills | Status: AC

## 2024-06-08 MED ORDER — PREDNISONE 50 MG PO TABS: ORAL_TABLET | ORAL | 0 refills | Status: DC

## 2024-06-08 NOTE — Telephone Encounter (Signed)
 Copied from CRM #[id number]. Topic: Clinical - Prescription Issue >> Jun 08, 2024 10:47 AM Devaughn RAMAN wrote: Reason for CRM: Hoy with [facility] called and stated [facility] cant transfer the prescriptiuons and  it needs to be sent over to [facility], Hoy is unsure which prescriptions were sent over because [facility] cannot transfer the medications, Hoy stated the prescriptions were written by Almarie Ferrari, patient does not have the medication and needs it as soon as possible, please f/u with Hoy at the information below:  [facility] [phone] [phone] >> Jun 08, 2024  3:26 PM Isabell A wrote: Hoy calling to speak with Amarrah Meinhart - transferred to CAL.   >> Jun 08, 2024  3:03 PM Leila BROCKS wrote: Patient [phone] states is returning Suzy Kugel's phone call, regarding needs to be premedicated before her CT scan, original prescription predniSONE  (DELTASONE ) 50 MG tablet was sent to [facility] pharmacy [phone] and they do not have it. Patient states she requested to be sent to [facility] [phone] instead. [facility] informed patient they sent a request, and no one has respond and to contact the office. Patient is asking for medication by tomorrow, patient has CT appointment on 06/10/24. Per CAL, warm transferred to [facility].  [address] - [place], [place] -  [address] [place] [address] Phone: [phone] Fax: [phone]  This was already handled. NFN

## 2024-06-08 NOTE — Telephone Encounter (Signed)
 Pt called stating that Kathleen Ferrari, NP called a RX into [facility] pharmacy for the Benadryl 50 mg tab and prednisone  50 mg to take before she has a ct scan. Pt states she needs this sent into [facility] as [facility] continues to tell the pt that they don't have anything for her. I informed pt that it would send this in now to [facility]. Pt verbalized understanding. NFN

## 2024-06-08 NOTE — Telephone Encounter (Signed)
 Received call from [place] with [facility]. They were calling to verify if pt should continue pred taper or take the new order of prednisone    'She needs to take prednisone  50mg  by mouth 13 hours, 7 hours and 1 hours before contrast CT scan (she would take this in place of the taper and then resume her previous dose); Plus Diphenhydramine 50mg  1 hour before contrast CT scan  Pharmacy is aware of directions.  Nfn

## 2024-06-09 ENCOUNTER — Telehealth: Payer: Self-pay

## 2024-06-09 NOTE — Telephone Encounter (Unsigned)
 Copied from CRM #[id number]. Topic: Clinical - Medical Advice >> Jun 09, 2024  9:53 AM Nathanel DEL wrote: Reason for CRM: pt states she has appt for her CT scan on Thurs Oct 23.  She would like to know if Almarie is OK w/ her waiting until this day. please put message in mychart asap. Pt had to reschedule her CT that was scheduled for Sat due to a scheduling conflict.    SPOKE w/ PT [facility]BU.    - NFN

## 2024-06-10 ENCOUNTER — Telehealth: Admitting: Nurse Practitioner

## 2024-06-10 ENCOUNTER — Ambulatory Visit (HOSPITAL_BASED_OUTPATIENT_CLINIC_OR_DEPARTMENT_OTHER)

## 2024-06-10 DIAGNOSIS — K5730 Diverticulosis of large intestine without perforation or abscess without bleeding: Secondary | ICD-10-CM | POA: Diagnosis not present

## 2024-06-10 MED ORDER — CIPROFLOXACIN HCL 500 MG PO TABS: 500.0000 mg | ORAL_TABLET | Freq: Two times a day (BID) | ORAL | 0 refills | Status: AC

## 2024-06-10 NOTE — Progress Notes (Signed)
 Virtual Visit Consent   Kathleen Hammond, you are scheduled for a virtual visit with a [facility] provider today. Just as with appointments in the office, your consent must be obtained to participate. Your consent will be active for this visit and any virtual visit you may have with one of our providers in the next 365 days. If you have a MyChart account, a copy of this consent can be sent to you electronically.  As this is a virtual visit, video technology does not allow for your provider to perform a traditional examination. This may limit your provider's ability to fully assess your condition. If your provider identifies any concerns that need to be evaluated in person or the need to arrange testing (such as labs, EKG, etc.), we will make arrangements to do so. Although advances in technology are sophisticated, we cannot ensure that it will always work on either your end or our end. If the connection with a video visit is poor, the visit may have to be switched to a telephone visit. With either a video or telephone visit, we are not always able to ensure that we have a secure connection.  By engaging in this virtual visit, you consent to the provision of healthcare and authorize for your insurance to be billed (if applicable) for the services provided during this visit. Depending on your insurance coverage, you may receive a charge related to this service.  I need to obtain your verbal consent now. Are you willing to proceed with your visit today? Kathleen Hammond has provided verbal consent on 06/10/2024 for a virtual visit (video or telephone). Kathleen LELON Servant, NP  Date: 06/10/2024 10:30 AM   Virtual Visit via Video Note   I, Kathleen Hammond, connected with  Kathleen Hammond  ([id number], 1955/10/30) on 06/10/24 at 10:15 AM EDT by a video-enabled telemedicine application and verified that I am speaking with the correct person using two identifiers.  Location: Patient: Virtual Visit Location Patient:  Home Provider: Virtual Visit Location Provider: Home Office   I discussed the limitations of evaluation and management by telemedicine and the availability of in person appointments. The patient expressed understanding and agreed to proceed.    History of Present Illness: Kathleen Hammond is a 68 y.o. who identifies as a female who was assigned female at birth, and is being seen today for LLQ abdominal pain..  Kathleen Hammond states she has been experiencing LLQ pain similar to her previous episodes of sigmoid diverticulosis for which she was prescribed CIPRO by her Gastroenterologist. Other associated symptoms today include abdominal cramping, low grade fever 99.7-100.    Problems:  Patient Active Problem List   Diagnosis Date Noted   Asthma 11/03/2022   Allergic rhinitis 11/03/2022   Alpha-1-antitrypsin deficiency carrier 11/03/2022   Posterior vitreous detachment of right eye 03/28/2020   Intermediate stage nonexudative age-related macular degeneration of both eyes 03/28/2020   Retinal detachment of left eye with retinal break 03/05/2020   Osteoporosis 08/28/2019   Mixed hyperlipidemia 03/16/2011   Sinus tachycardia 03/16/2011    Allergies:  Allergies  Allergen Reactions   Cefuroxime Axetil Shortness Of Breath   Iodine Shortness Of Breath   Alendronate Sodium Swelling   Augmentin  [Amoxicillin -Pot Clavulanate] Other (See Comments)    Pt c/o tingling in lips   Avelox [Moxifloxacin Hcl In Nacl] Other (See Comments)    Hallucinations    Macrodantin [Nitrofurantoin Macrocrystal] Nausea Only    Chills, aching   Sulfamethoxazole-Trimethoprim Other (See Comments)  Bactrim Rash    Septra   Ceclor [Cefaclor] Rash   Medications:  Current Outpatient Medications:    ciprofloxacin (CIPRO) 500 MG tablet, Take 1 tablet (500 mg total) by mouth 2 (two) times daily for 5 days., Disp: 10 tablet, Rfl: 0   albuterol  (PROVENTIL ) (2.5 MG/3ML) 0.083% nebulizer solution, Take 3 mLs (2.5 mg total) by  nebulization every 6 (six) hours as needed for wheezing or shortness of breath., Disp: 75 mL, Rfl: 2   albuterol  (VENTOLIN  HFA) 108 (90 Base) MCG/ACT inhaler, Inhale 1-2 puffs into the lungs every 6 (six) hours as needed for wheezing or shortness of breath., Disp: 8 g, Rfl: 6   budesonide -formoterol  (SYMBICORT ) 160-4.5 MCG/ACT inhaler, Inhale 2 puffs into the lungs 2 (two) times daily., Disp: 30.6 each, Rfl: 3   budesonide -glycopyrrolate-formoterol  (BREZTRI AEROSPHERE) 160-9-4.8 MCG/ACT AERO inhaler, Inhale 2 puffs into the lungs in the morning and at bedtime., Disp: , Rfl:    diphenhydrAMINE (BENADRYL) 50 MG tablet, Take 50mg  1 hour before contrast CT scan, Disp: 1 tablet, Rfl: 0   magic mouthwash (nystatin, lidocaine, diphenhydrAMINE, alum & mag hydroxide) suspension, Swish and swallow 5 mLs 3 (three) times daily for 12 days., Disp: 180 mL, Rfl: 0   PANTOPRAZOLE SODIUM PO, Take 40 mg by mouth daily., Disp: , Rfl:    predniSONE  (DELTASONE ) 10 MG tablet, Take 4 tablets (40 mg total) by mouth daily for 5 days, THEN 3 tablets (30 mg total) daily for 3 days, THEN 2 tablets (20 mg total) daily for 3 days, THEN 1 tablet (10 mg total) daily for 3 days., Disp: 38 tablet, Rfl: 0   predniSONE  (DELTASONE ) 50 MG tablet, Take prednisone  50mg  by mouth 13 hours, 7 hours and 1 hours before contrast CT scan, Disp: 3 tablet, Rfl: 0   rosuvastatin (CRESTOR) 5 MG tablet, 10 mg., Disp: , Rfl:    SINGULAIR 10 MG tablet, Take 1 tablet by mouth at bedtime. , Disp: , Rfl:   Observations/Objective: Patient is well-developed, well-nourished in no acute distress.  Resting comfortably at home.  Head is normocephalic, atraumatic.  No labored breathing.  Speech is clear and coherent with logical content.  Patient is alert and oriented at baseline.    Assessment and Plan: 1. Sigmoid diverticulosis (Primary) - ciprofloxacin (CIPRO) 500 MG tablet; Take 1 tablet (500 mg total) by mouth 2 (two) times daily for 5 days.   Dispense: 10 tablet; Refill: 0  Follow up with GI on Monday  Follow Up Instructions: I discussed the assessment and treatment plan with the patient. The patient was provided an opportunity to ask questions and all were answered. The patient agreed with the plan and demonstrated an understanding of the instructions.  A copy of instructions were sent to the patient via MyChart unless otherwise noted below.     The patient was advised to call back or seek an in-person evaluation if the symptoms worsen or if the condition fails to improve as anticipated.    Kathleen Secrist W Tunisha Ruland, NP

## 2024-06-10 NOTE — Patient Instructions (Signed)
 Kathleen Hammond, thank you for joining [name], NP for today's virtual visit.  While this provider is not your primary care provider (PCP), if your PCP is located in our provider database this encounter information will be shared with them immediately following your visit.   A [facility] MyChart account gives you access to today's visit and all your visits, tests, and labs performed at [facility]  click here if you don't have a [facility] MyChart account or go to [email or web address]  Consent: (Patient) Kathleen Hammond provided verbal consent for this virtual visit at the beginning of the encounter.  Current Medications:  Current Outpatient Medications:    ciprofloxacin (CIPRO) 500 MG tablet, Take 1 tablet (500 mg total) by mouth 2 (two) times daily for 5 days., Disp: 10 tablet, Rfl: 0   albuterol  (PROVENTIL ) (2.5 MG/3ML) 0.083% nebulizer solution, Take 3 mLs (2.5 mg total) by nebulization every 6 (six) hours as needed for wheezing or shortness of breath., Disp: 75 mL, Rfl: 2   albuterol  (VENTOLIN  HFA) 108 (90 Base) MCG/ACT inhaler, Inhale 1-2 puffs into the lungs every 6 (six) hours as needed for wheezing or shortness of breath., Disp: 8 g, Rfl: 6   budesonide -formoterol  (SYMBICORT ) 160-4.5 MCG/ACT inhaler, Inhale 2 puffs into the lungs 2 (two) times daily., Disp: 30.6 each, Rfl: 3   budesonide -glycopyrrolate-formoterol  (BREZTRI AEROSPHERE) 160-9-4.8 MCG/ACT AERO inhaler, Inhale 2 puffs into the lungs in the morning and at bedtime., Disp: , Rfl:    diphenhydrAMINE (BENADRYL) 50 MG tablet, Take 50mg  1 hour before contrast CT scan, Disp: 1 tablet, Rfl: 0   magic mouthwash (nystatin, lidocaine, diphenhydrAMINE, alum & mag hydroxide) suspension, Swish and swallow 5 mLs 3 (three) times daily for 12 days., Disp: 180 mL, Rfl: 0   PANTOPRAZOLE SODIUM PO, Take 40 mg by mouth daily., Disp: , Rfl:    predniSONE  (DELTASONE ) 10 MG tablet, Take 4 tablets (40 mg total) by mouth daily for 5  days, THEN 3 tablets (30 mg total) daily for 3 days, THEN 2 tablets (20 mg total) daily for 3 days, THEN 1 tablet (10 mg total) daily for 3 days., Disp: 38 tablet, Rfl: 0   predniSONE  (DELTASONE ) 50 MG tablet, Take prednisone  50mg  by mouth 13 hours, 7 hours and 1 hours before contrast CT scan, Disp: 3 tablet, Rfl: 0   rosuvastatin (CRESTOR) 5 MG tablet, 10 mg., Disp: , Rfl:    SINGULAIR 10 MG tablet, Take 1 tablet by mouth at bedtime. , Disp: , Rfl:    Medications ordered in this encounter:  Meds ordered this encounter  Medications   ciprofloxacin (CIPRO) 500 MG tablet    Sig: Take 1 tablet (500 mg total) by mouth 2 (two) times daily for 5 days.    Dispense:  10 tablet    Refill:  0    Supervising Provider:   BLAISE ALEENE KIDD [[id number]]     *If you need refills on other medications prior to your next appointment, please contact your pharmacy*  Follow-Up: Call back or seek an in-person evaluation if the symptoms worsen or if the condition fails to improve as anticipated.  [facility] [phone]  Other Instructions Follow up with GI on Monday   If you have been instructed to have an in-person evaluation today at a local Urgent Care facility, please use the link below. It will take you to a list of all of our available [facility], including address, phone number and  hours of operation. Please do not delay care.  [facility]  If you or a family member do not have a primary care provider, use the link below to schedule a visit and establish care. When you choose a [facility] primary care physician or advanced practice provider, you gain a long-term partner in health. Find a Primary Care Provider  Learn more about [facility]'s in-office and virtual care options: [facility] - Get Care Now

## 2024-06-13 ENCOUNTER — Ambulatory Visit: Payer: Self-pay | Admitting: Pulmonary Disease

## 2024-06-13 NOTE — Telephone Encounter (Signed)
 FYI Only or Action Required?: Action required by provider: clinical question for provider.  Patient is followed in Pulmonology for Asthma, last seen on 06/05/2024 by Hope Almarie ORN, NP.  Called Nurse Triage reporting PCP Call.  Symptoms began today.   Triage Disposition: Call PCP Within 24 Hours  Patient/caregiver understands and will follow disposition?: Yes  **See note below**       Reason for Disposition  [1] Caller requests to speak ONLY to PCP AND [2] NON-URGENT question  Answer Assessment - Initial Assessment Questions 1. REASON FOR CALL or QUESTION: What is your reason for calling today? or How can I best   Patient wants to know if she needs to repeat the labs as she did some research and stated her labs may be abnormal due to her age, also patient scheduled for chest ct, w/ contrast wants to know ct is still needed based on that she feels like she is improving and also D Dimer test results indicate she is improving coming back at .58 - she is allergic to contrast so wants to know if ct can be avoided in this case. Please advise.  Protocols used: PCP Call - No Triage-A-AH

## 2024-06-13 NOTE — Telephone Encounter (Signed)
 If shortness of breath and tachycardia are improving we can hold off Her d-dimer was only slightly elevated but increased risk associated with covid

## 2024-06-13 NOTE — Telephone Encounter (Signed)
 1st attempt to contact the patient. No answer. LVM to call back. Routing for further attempts.      Copied from CRM [id number]. Topic: Clinical - Medical Advice >> Jun 13, 2024  1:32 PM Ismael A wrote: Reason for CRM: patient cheduled for chest ct, w/ contrast wants to know ct is still needed based on that she feels like she is improving and also D Dimer test results indicate she is improving coming back at .39 - she is allergic to contrast so wants to know if ct can be avoided in this case

## 2024-06-13 NOTE — Telephone Encounter (Signed)
**Note De-identified  [place] Obfuscation** Please advise 

## 2024-06-14 ENCOUNTER — Encounter

## 2024-06-14 DIAGNOSIS — K5792 Diverticulitis of intestine, part unspecified, without perforation or abscess without bleeding: Secondary | ICD-10-CM | POA: Diagnosis not present

## 2024-06-14 DIAGNOSIS — G4700 Insomnia, unspecified: Secondary | ICD-10-CM | POA: Diagnosis not present

## 2024-06-14 DIAGNOSIS — J302 Other seasonal allergic rhinitis: Secondary | ICD-10-CM | POA: Diagnosis not present

## 2024-06-14 DIAGNOSIS — J4520 Mild intermittent asthma, uncomplicated: Secondary | ICD-10-CM | POA: Diagnosis not present

## 2024-06-14 DIAGNOSIS — K580 Irritable bowel syndrome with diarrhea: Secondary | ICD-10-CM | POA: Diagnosis not present

## 2024-06-14 DIAGNOSIS — E8801 Alpha-1-antitrypsin deficiency: Secondary | ICD-10-CM | POA: Diagnosis not present

## 2024-06-14 DIAGNOSIS — R000 Tachycardia, unspecified: Secondary | ICD-10-CM | POA: Diagnosis not present

## 2024-06-14 DIAGNOSIS — U099 Post covid-19 condition, unspecified: Secondary | ICD-10-CM | POA: Diagnosis not present

## 2024-06-14 DIAGNOSIS — K219 Gastro-esophageal reflux disease without esophagitis: Secondary | ICD-10-CM | POA: Diagnosis not present

## 2024-06-14 NOTE — Telephone Encounter (Signed)
 I called and spoke to pt. Pt informed of Beth's note and verbalized understanding. Pt states she followed up with her PCP today and her hr was 150. PCP states she needs to have the CT tomorrow which is already scheduled. Pt states sher PCP thinks her Symptoms are consistent with long term Covid.

## 2024-06-14 NOTE — Telephone Encounter (Signed)
 Yes I AGREE

## 2024-06-15 ENCOUNTER — Encounter: Payer: Self-pay | Admitting: Family Medicine

## 2024-06-15 ENCOUNTER — Ambulatory Visit (HOSPITAL_COMMUNITY)
Admission: RE | Admit: 2024-06-15 | Discharge: 2024-06-15 | Disposition: A | Discharge status: Home / Self Care | Source: Ambulatory Visit | Attending: Primary Care | Admitting: Primary Care

## 2024-06-15 ENCOUNTER — Other Ambulatory Visit: Payer: Self-pay | Admitting: *Deleted

## 2024-06-15 DIAGNOSIS — M810 Age-related osteoporosis without current pathological fracture: Secondary | ICD-10-CM

## 2024-06-15 DIAGNOSIS — I2699 Other pulmonary embolism without acute cor pulmonale: Secondary | ICD-10-CM | POA: Diagnosis not present

## 2024-06-15 DIAGNOSIS — Z0389 Encounter for observation for other suspected diseases and conditions ruled out: Secondary | ICD-10-CM | POA: Diagnosis not present

## 2024-06-15 MED ORDER — IOHEXOL 350 MG/ML SOLN
75.0000 mL | Freq: Once | INTRAVENOUS | Status: AC | PRN
  Administered 2024-06-15: 75 mL via INTRAVENOUS

## 2024-06-16 ENCOUNTER — Ambulatory Visit: Payer: Self-pay | Admitting: Primary Care

## 2024-06-16 NOTE — Progress Notes (Signed)
 Please let patient know CT chest was negative for blood clot in her lungs, no evidence of pneumonia

## 2024-06-16 NOTE — Progress Notes (Signed)
 Called and spoke to pt - advised of CT results per Landry Ferrari, NP. Pt verbalized understanding, NFN.

## 2024-06-21 NOTE — Telephone Encounter (Addendum)
 [employer]  Patient is ready for scheduling on or after: 06/29/24  Out-of-pocket cost due at time of visit: $64 per patient after speaking with [employer].  [phone]  Primary: [employer] [place]  co-insurance: 20% (approximately $353)- undisclosed Admin fee co-insurance: $0   Patient's OOP for [phone] is based on the CMS fee schedule. Rep I spoke to states [employer] no longer provides benefits for J codes.    Deductible: $ undisclosed  Prior Auth: Approved PA Case ID #: [id number] Valid: 12/17/22 -08/23/2025    ** This summary of benefits is an estimation of the patient's out-of-pocket cost. Exact cost may vary based on individual plan coverage.

## 2024-06-26 ENCOUNTER — Encounter: Payer: Self-pay | Admitting: Family Medicine

## 2024-06-27 ENCOUNTER — Telehealth: Payer: Self-pay

## 2024-06-27 DIAGNOSIS — M810 Age-related osteoporosis without current pathological fracture: Secondary | ICD-10-CM | POA: Diagnosis not present

## 2024-06-27 DIAGNOSIS — J45909 Unspecified asthma, uncomplicated: Secondary | ICD-10-CM

## 2024-06-27 MED ORDER — ALBUTEROL SULFATE HFA 108 (90 BASE) MCG/ACT IN AERS: 1.0000 | INHALATION_SPRAY | Freq: Four times a day (QID) | RESPIRATORY_TRACT | 6 refills | Status: AC | PRN

## 2024-06-27 NOTE — Telephone Encounter (Signed)
 Received fax from [facility] to refill albuterol ,refill sent

## 2024-06-28 ENCOUNTER — Ambulatory Visit: Payer: Self-pay | Admitting: Family Medicine

## 2024-06-28 ENCOUNTER — Telehealth: Payer: Self-pay

## 2024-06-28 LAB — VITAMIN D 25 HYDROXY (VIT D DEFICIENCY, FRACTURES): Vit D, 25-Hydroxy: 35.4 ng/mL (ref 30.0–100.0)

## 2024-06-28 NOTE — Telephone Encounter (Signed)
 Opened by mistake.

## 2024-07-04 ENCOUNTER — Ambulatory Visit: Admitting: Family Medicine

## 2024-07-05 ENCOUNTER — Ambulatory Visit: Admitting: Family Medicine

## 2024-07-05 DIAGNOSIS — M810 Age-related osteoporosis without current pathological fracture: Secondary | ICD-10-CM | POA: Diagnosis not present

## 2024-07-05 MED ORDER — DENOSUMAB 60 MG/ML ~~LOC~~ SOSY
60.0000 mg | PREFILLED_SYRINGE | Freq: Once | SUBCUTANEOUS | Status: AC
  Administered 2024-07-05: 60 mg via SUBCUTANEOUS

## 2024-07-05 NOTE — Progress Notes (Addendum)
 Patient given [place] prolia injection 60mg /ml in her left arm. Patient tolerated injection well without reaction at the injection site. Patient will schedule next injection, which is 6 months from today. Pt given lab orders to have done before next injection.

## 2024-07-10 ENCOUNTER — Inpatient Hospital Stay (HOSPITAL_COMMUNITY): Admission: RE | Admit: 2024-07-10

## 2024-07-13 ENCOUNTER — Other Ambulatory Visit: Payer: Self-pay | Admitting: Obstetrics and Gynecology

## 2024-07-13 ENCOUNTER — Telehealth: Admitting: Physician Assistant

## 2024-07-13 DIAGNOSIS — Z1231 Encounter for screening mammogram for malignant neoplasm of breast: Secondary | ICD-10-CM

## 2024-07-13 DIAGNOSIS — J329 Chronic sinusitis, unspecified: Secondary | ICD-10-CM

## 2024-07-13 MED ORDER — CETIRIZINE HCL 10 MG PO TABS: 10.0000 mg | ORAL_TABLET | Freq: Every day | ORAL | 0 refills | Status: AC

## 2024-07-13 MED ORDER — DOXYCYCLINE HYCLATE 100 MG PO TABS: 100.0000 mg | ORAL_TABLET | Freq: Two times a day (BID) | ORAL | 0 refills | Status: AC

## 2024-07-13 NOTE — Progress Notes (Signed)
 Virtual Visit Consent   Kathleen Hammond, you are scheduled for a virtual visit with a [facility] provider today. Just as with appointments in the office, your consent must be obtained to participate. Your consent will be active for this visit and any virtual visit you may have with one of our providers in the next 365 days. If you have a MyChart account, a copy of this consent can be sent to you electronically.  As this is a virtual visit, video technology does not allow for your provider to perform a traditional examination. This may limit your provider's ability to fully assess your condition. If your provider identifies any concerns that need to be evaluated in person or the need to arrange testing (such as labs, EKG, etc.), we will make arrangements to do so. Although advances in technology are sophisticated, we cannot ensure that it will always work on either your end or our end. If the connection with a video visit is poor, the visit may have to be switched to a telephone visit. With either a video or telephone visit, we are not always able to ensure that we have a secure connection.  By engaging in this virtual visit, you consent to the provision of healthcare and authorize for your insurance to be billed (if applicable) for the services provided during this visit. Depending on your insurance coverage, you may receive a charge related to this service.  I need to obtain your verbal consent now. Are you willing to proceed with your visit today? Kathleen Hammond has provided verbal consent on 07/13/2024 for a virtual visit (video or telephone). Kathleen Hammond, [place]  Date: 07/13/2024 9:38 AM   Virtual Visit via Video Note   I, Kathleen Hammond, connected with  Kathleen Hammond  ([id number], 1955/12/26) on 07/13/24 at  9:30 AM EST by a video-enabled telemedicine application and verified that I am speaking with the correct person using two identifiers.  Location: Patient: Virtual Visit Location Patient:  Home Provider: Virtual Visit Location Provider: Home Office   I discussed the limitations of evaluation and management by telemedicine and the availability of in person appointments. The patient expressed understanding and agreed to proceed.    History of Present Illness: Kathleen Hammond is a 68 y.o. who identifies as a female who was assigned female at birth, and is being seen today for sinusitis.  HPI: Sinus Problem This is a new problem. The current episode started 1 to 4 weeks ago. The problem is unchanged. There has been no fever. Associated symptoms include congestion and sinus pressure. Past treatments include oral decongestants. The treatment provided mild relief.    Problems:  Patient Active Problem List   Diagnosis Date Noted   Asthma 11/03/2022   Allergic rhinitis 11/03/2022   Alpha-1-antitrypsin deficiency carrier 11/03/2022   Posterior vitreous detachment of right eye 03/28/2020   Intermediate stage nonexudative age-related macular degeneration of both eyes 03/28/2020   Retinal detachment of left eye with retinal break 03/05/2020   Osteoporosis 08/28/2019   Mixed hyperlipidemia 03/16/2011   Sinus tachycardia 03/16/2011    Allergies:  Allergies  Allergen Reactions   Cefuroxime Axetil Shortness Of Breath   Iodine Shortness Of Breath   Alendronate Sodium Swelling   Augmentin  [Amoxicillin -Pot Clavulanate] Other (See Comments)    Pt c/o tingling in lips   Avelox [Moxifloxacin Hcl In Nacl] Other (See Comments)    Hallucinations    Macrodantin [Nitrofurantoin Macrocrystal] Nausea Only    Chills, aching   Sulfamethoxazole-Trimethoprim  Other (See Comments)   Bactrim Rash    Septra   Ceclor [Cefaclor] Rash   Medications:  Current Outpatient Medications:    albuterol  (PROVENTIL ) (2.5 MG/3ML) 0.083% nebulizer solution, Take 3 mLs (2.5 mg total) by nebulization every 6 (six) hours as needed for wheezing or shortness of breath., Disp: 75 mL, Rfl: 2   albuterol  (VENTOLIN  HFA)  108 (90 Base) MCG/ACT inhaler, Inhale 1-2 puffs into the lungs every 6 (six) hours as needed for wheezing or shortness of breath., Disp: 8 g, Rfl: 6   budesonide -formoterol  (SYMBICORT ) 160-4.5 MCG/ACT inhaler, Inhale 2 puffs into the lungs 2 (two) times daily., Disp: 30.6 each, Rfl: 3   budesonide -glycopyrrolate-formoterol  (BREZTRI AEROSPHERE) 160-9-4.8 MCG/ACT AERO inhaler, Inhale 2 puffs into the lungs in the morning and at bedtime., Disp: , Rfl:    diphenhydrAMINE (BENADRYL) 50 MG tablet, Take 50mg  1 hour before contrast CT scan, Disp: 1 tablet, Rfl: 0   PANTOPRAZOLE SODIUM PO, Take 40 mg by mouth daily., Disp: , Rfl:    predniSONE  (DELTASONE ) 50 MG tablet, Take prednisone  50mg  by mouth 13 hours, 7 hours and 1 hours before contrast CT scan, Disp: 3 tablet, Rfl: 0   rosuvastatin (CRESTOR) 5 MG tablet, 10 mg., Disp: , Rfl:    SINGULAIR 10 MG tablet, Take 1 tablet by mouth at bedtime. , Disp: , Rfl:   Observations/Objective: Patient is well-developed, well-nourished in no acute distress.  Resting comfortably  at home.  Head is normocephalic, atraumatic.  No labored breathing.  Speech is clear and coherent with logical content.  Patient is alert and oriented at baseline.    Assessment and Plan: 1. Sinusitis, unspecified chronicity, unspecified location (Primary)  Presentation consistent with sinusitis.  No evidence of other bacterial infections including pneumonia, meningitis, pharyngitis, otitis media.  Discussed that this fits picture of  bacterial sinus itis and that due to type and duration of symptoms and exam findings, we will treat as bacterial sinusitis.  Antibiotics prescribed. Advised to continue ibuprofen and Tylenol  at home. Patient is to follow up with primary physician if having continued symptoms.   Advised patient on supportive therapies, including using a cool-mist vaporizer/humidifier/steam from hot showers, OTC throat lozenges, advancement of fluids as tolerated, nasal saline  sprays, rest, OTC acetaminophen  or ibuprofen for pain control, frequent handwashing.  Follow Up Instructions: I discussed the assessment and treatment plan with the patient. The patient was provided an opportunity to ask questions and all were answered. The patient agreed with the plan and demonstrated an understanding of the instructions.  A copy of instructions were sent to the patient via MyChart unless otherwise noted below.    The patient was advised to call back or seek an in-person evaluation if the symptoms worsen or if the condition fails to improve as anticipated.    Kathleen Shuck, PA-C

## 2024-07-13 NOTE — Patient Instructions (Signed)
  Kathleen Hammond, thank you for joining Teena Shuck, PA-C for today's virtual visit.  While this provider is not your primary care provider (PCP), if your PCP is located in our provider database this encounter information will be shared with them immediately following your visit.   A [facility] MyChart account gives you access to today's visit and all your visits, tests, and labs performed at [facility]  click here if you don't have a [facility] MyChart account or go to [email or web address]  Consent: (Patient) Kathleen Hammond provided verbal consent for this virtual visit at the beginning of the encounter.  Current Medications:  Current Outpatient Medications:    albuterol  (PROVENTIL ) (2.5 MG/3ML) 0.083% nebulizer solution, Take 3 mLs (2.5 mg total) by nebulization every 6 (six) hours as needed for wheezing or shortness of breath., Disp: 75 mL, Rfl: 2   albuterol  (VENTOLIN  HFA) 108 (90 Base) MCG/ACT inhaler, Inhale 1-2 puffs into the lungs every 6 (six) hours as needed for wheezing or shortness of breath., Disp: 8 g, Rfl: 6   budesonide -formoterol  (SYMBICORT ) 160-4.5 MCG/ACT inhaler, Inhale 2 puffs into the lungs 2 (two) times daily., Disp: 30.6 each, Rfl: 3   budesonide -glycopyrrolate-formoterol  (BREZTRI AEROSPHERE) 160-9-4.8 MCG/ACT AERO inhaler, Inhale 2 puffs into the lungs in the morning and at bedtime., Disp: , Rfl:    diphenhydrAMINE (BENADRYL) 50 MG tablet, Take 50mg  1 hour before contrast CT scan, Disp: 1 tablet, Rfl: 0   PANTOPRAZOLE SODIUM PO, Take 40 mg by mouth daily., Disp: , Rfl:    predniSONE  (DELTASONE ) 50 MG tablet, Take prednisone  50mg  by mouth 13 hours, 7 hours and 1 hours before contrast CT scan, Disp: 3 tablet, Rfl: 0   rosuvastatin (CRESTOR) 5 MG tablet, 10 mg., Disp: , Rfl:    SINGULAIR 10 MG tablet, Take 1 tablet by mouth at bedtime. , Disp: , Rfl:    Medications ordered in this encounter:  No orders of the defined types were placed in this encounter.     *If you need refills on other medications prior to your next appointment, please contact your pharmacy*  Follow-Up: Call back or seek an in-person evaluation if the symptoms worsen or if the condition fails to improve as anticipated.  [facility] [phone]  Other Instructions Follow up with primary provider in 24-48 hours. Report to nearest ER with any worsening symptoms.    If you have been instructed to have an in-person evaluation today at a local Urgent Care facility, please use the link below. It will take you to a list of all of our available [facility], including address, phone number and hours of operation. Please do not delay care.  [facility]  If you or a family member do not have a primary care provider, use the link below to schedule a visit and establish care. When you choose a [facility] primary care physician or advanced practice provider, you gain a long-term partner in health. Find a Primary Care Provider  Learn more about [facility]'s in-office and virtual care options: [facility] - Get Care Now

## 2024-07-26 ENCOUNTER — Encounter: Payer: Self-pay | Admitting: Family Medicine

## 2024-07-26 ENCOUNTER — Telehealth: Payer: Self-pay

## 2024-07-26 NOTE — Telephone Encounter (Signed)
 Copied from CRM #[id number]. Topic: Clinical - Request for Lab/Test Order >> Jul 26, 2024 11:40 AM Russell PARAS wrote: Reason for CRM:   Pt reaching out to double check if she needs labwork performed prior to visit on 12/15.   CB#  [id number]  I called and spoke to pt. Pt does not need any labs before hr upcoming appt. I also informed pt that her 08-07-24 appt with Dr Theophilus was cancelled and she was rescheduled to see Madelin Stank, NP on 09-18-2024 due to needing a pft before. Pt verbalized understanding. NFN

## 2024-08-03 ENCOUNTER — Ambulatory Visit: Payer: Self-pay | Admitting: Pulmonary Disease

## 2024-08-03 ENCOUNTER — Telehealth: Admitting: Physician Assistant

## 2024-08-03 DIAGNOSIS — J329 Chronic sinusitis, unspecified: Secondary | ICD-10-CM

## 2024-08-03 MED ORDER — BREZTRI AEROSPHERE 160-9-4.8 MCG/ACT IN AERO: 2.0000 | INHALATION_SPRAY | Freq: Two times a day (BID) | RESPIRATORY_TRACT | 11 refills | Status: DC

## 2024-08-03 NOTE — Telephone Encounter (Signed)
 FYI Only or Action Required?: Action required by provider: medication refill request. Pt is requesting, predisone burst of 40mg  for 5 days, a z-pack and Breztri  inhaler (pt had gotten a sample and felt that worked better than symbicort .) Pt would like that called into [facility].   Patient is followed in Pulmonology for , last seen on 06/05/2024 by Hope Almarie ORN, NP.  Called Nurse Triage reporting Sinusitis. Face pain, pressure , ear popping, HA.  Symptoms began a week ago.  Interventions attempted: Other: Seen at a vv and given doxycycline .  Symptoms are: gradually worsening.  Triage Disposition: See PCP When Office is Open (Within 3 Days)  Patient/caregiver understands and will follow disposition?: Yes             E2C2 Pulmonary Triage - Initial Assessment Questions Chief Complaint (e.g., cough, sob, wheezing, fever, chills, sweat or additional symptoms) *Go to specific symptom protocol after initial questions. Teeth hurt, face is tender, HA, fatigue, COVID fog  How long have symptoms been present? 5 days - getting sob and going into lungs today  Have you tested for COVID or Flu? Note: If not, ask patient if a home test can be taken. If so, instruct patient to call back for positive results. No  MEDICINES:   Have you used any OTC meds to help with symptoms? Yes If yes, ask What medications? Dimetapp, mucinex, Tylenol   Have you used your inhalers/maintenance medication? No If yes, What medications? Symbicort  is available for her  If inhaler, ask How many puffs and how often? Note: Review instructions on medication in the chart. Not using  OXYGEN: Do you wear supplemental oxygen? No If yes, How many liters are you supposed to use? na  Do you monitor your oxygen levels? Yes If yes, What is your reading (oxygen level) today?96   What is your usual oxygen saturation reading?  (Note: Pulmonary O2 sats should be 90% or  greater) 96               Copied from CRM #[id number]. 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 LOCATION: Where does it hurt?      Face, teeth hurt 2. ONSET: When did the sinus pain start?  (e.g., hours, days)      A few days ago 3. SEVERITY: How bad is the pain?   (Scale 0-10; or none, mild, moderate or severe)     modertate 4. RECURRENT SYMPTOM: Have you ever had sinus problems before? If Yes, ask: When was the last time? and What happened that time?      yes 5. NASAL CONGESTION: Is the nose blocked? If Yes, ask: Can you open it or must you breathe through your mouth?     no 6. NASAL DISCHARGE: Do you have discharge from your nose? If so ask, What color?     no 7. FEVER: Do you have a fever? If Yes, ask: What is it, how was it measured, and when did it start?      no 8. OTHER SYMPTOMS: Do you have  any other symptoms? (e.g., sore throat, cough, earache, difficulty breathing)     Ears are popping.  Protocols used: Sinus Pain or Congestion-A-AH

## 2024-08-03 NOTE — Telephone Encounter (Signed)
 Dr. Theophilus, Please advise regarding prednisone  and z-pack.  I will go ahead and send in prescription to pharmacy for the Breztri  (Beth changed her inhaler at last visit, she was given a sample).  Thank you.

## 2024-08-03 NOTE — Progress Notes (Signed)
° °  Thank you for the details you included in the comment boxes. Those details are very helpful in determining the best course of treatment for you and help us  to provide the best care.Because of recent treatment for sinusitis in the past couple of weeks via virtual care we recommend that you schedule a Virtual Urgent Care video visit in order for the provider to better assess what is going on.  The provider will be able to give you a more accurate diagnosis and treatment plan if we can more freely discuss your symptoms and with the addition of a virtual examination.   If you change your visit to a video visit, we will bill your insurance (similar to an office visit) and you will not be charged for this e-Visit. You will be able to stay at home and speak with the first available [facility] advanced practice provider. The link to do a video visit is in the drop down Menu tab of your Welcome screen in MyChart.

## 2024-08-07 ENCOUNTER — Ambulatory Visit: Admitting: Pulmonary Disease

## 2024-08-10 ENCOUNTER — Ambulatory Visit

## 2024-08-10 DIAGNOSIS — Z1231 Encounter for screening mammogram for malignant neoplasm of breast: Secondary | ICD-10-CM

## 2024-08-14 MED ORDER — PREDNISONE 10 MG PO TABS: 40.0000 mg | ORAL_TABLET | Freq: Every day | ORAL | 0 refills | Status: AC

## 2024-08-14 MED ORDER — AZITHROMYCIN 250 MG PO TABS: ORAL_TABLET | ORAL | 0 refills | Status: AC

## 2024-08-14 MED ORDER — BREZTRI AEROSPHERE 160-9-4.8 MCG/ACT IN AERO: 2.0000 | INHALATION_SPRAY | Freq: Two times a day (BID) | RESPIRATORY_TRACT | 3 refills | Status: AC

## 2024-08-14 NOTE — Addendum Note (Signed)
 Addended byBETHA THEOPHILUS ROOSEVELT on: 08/14/2024 05:23 PM   Modules accepted: Orders

## 2024-08-14 NOTE — Telephone Encounter (Signed)
 I called and discussed with patient.  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  Nothing further needed.

## 2024-09-05 ENCOUNTER — Ambulatory Visit
Admission: RE | Admit: 2024-09-05 | Discharge: 2024-09-05 | Disposition: A | Source: Ambulatory Visit | Attending: Obstetrics and Gynecology | Admitting: Obstetrics and Gynecology

## 2024-09-05 DIAGNOSIS — Z1231 Encounter for screening mammogram for malignant neoplasm of breast: Secondary | ICD-10-CM

## 2024-09-18 ENCOUNTER — Encounter

## 2024-09-18 ENCOUNTER — Ambulatory Visit: Admitting: Adult Health

## 2024-10-26 ENCOUNTER — Ambulatory Visit: Admitting: Pulmonary Disease

## 2024-10-26 ENCOUNTER — Encounter

## 2025-01-03 ENCOUNTER — Ambulatory Visit: Admitting: Family Medicine
